# Patient Record
Sex: Female | Born: 1945
Health system: Southern US, Community
[De-identification: ages and names within clinical notes are randomized; demographics above are authoritative.]

## PROBLEM LIST (undated history)

## (undated) DIAGNOSIS — I700 Atherosclerosis of aorta: Secondary | ICD-10-CM

## (undated) DIAGNOSIS — M5430 Sciatica, unspecified side: Secondary | ICD-10-CM

## (undated) DIAGNOSIS — IMO0001 Reserved for inherently not codable concepts without codable children: Secondary | ICD-10-CM

## (undated) DIAGNOSIS — I10 Essential (primary) hypertension: Secondary | ICD-10-CM

## (undated) DIAGNOSIS — E785 Hyperlipidemia, unspecified: Secondary | ICD-10-CM

## (undated) DIAGNOSIS — M48061 Spinal stenosis, lumbar region without neurogenic claudication: Secondary | ICD-10-CM

## (undated) DIAGNOSIS — E559 Vitamin D deficiency, unspecified: Secondary | ICD-10-CM

## (undated) DIAGNOSIS — K219 Gastro-esophageal reflux disease without esophagitis: Secondary | ICD-10-CM

## (undated) DIAGNOSIS — G629 Polyneuropathy, unspecified: Secondary | ICD-10-CM

## (undated) DIAGNOSIS — M8580 Other specified disorders of bone density and structure, unspecified site: Secondary | ICD-10-CM

## (undated) DIAGNOSIS — T7840XA Allergy, unspecified, initial encounter: Secondary | ICD-10-CM

## (undated) DIAGNOSIS — Z87891 Personal history of nicotine dependence: Secondary | ICD-10-CM

## (undated) DIAGNOSIS — N182 Chronic kidney disease, stage 2 (mild): Secondary | ICD-10-CM

## (undated) DIAGNOSIS — I878 Other specified disorders of veins: Secondary | ICD-10-CM

## (undated) DIAGNOSIS — Z972 Presence of dental prosthetic device (complete) (partial): Secondary | ICD-10-CM

## (undated) DIAGNOSIS — E669 Obesity, unspecified: Secondary | ICD-10-CM

## (undated) DIAGNOSIS — M7121 Synovial cyst of popliteal space [Baker], right knee: Secondary | ICD-10-CM

## (undated) HISTORY — DX: Hyperlipidemia, unspecified: E78.5

## (undated) HISTORY — DX: Atherosclerosis of aorta: I70.0

## (undated) HISTORY — PX: ABDOMINAL HYSTERECTOMY: SHX81

## (undated) HISTORY — DX: Gastro-esophageal reflux disease without esophagitis: K21.9

## (undated) HISTORY — DX: Allergy, unspecified, initial encounter: T78.40XA

## (undated) HISTORY — DX: Chronic kidney disease, stage 2 (mild): N18.2

## (undated) HISTORY — DX: Spinal stenosis, lumbar region without neurogenic claudication: M48.061

## (undated) HISTORY — DX: Reserved for inherently not codable concepts without codable children: IMO0001

## (undated) HISTORY — DX: Other specified disorders of veins: I87.8

## (undated) HISTORY — PX: ECTOPIC PREGNANCY SURGERY: SHX613

## (undated) HISTORY — DX: Obesity, unspecified: E66.9

## (undated) HISTORY — DX: Personal history of nicotine dependence: Z87.891

## (undated) HISTORY — DX: Synovial cyst of popliteal space (Baker), right knee: M71.21

## (undated) HISTORY — DX: Essential (primary) hypertension: I10

## (undated) HISTORY — DX: Polyneuropathy, unspecified: G62.9

## (undated) HISTORY — DX: Vitamin D deficiency, unspecified: E55.9

## (undated) HISTORY — PX: CATARACT EXTRACTION, BILATERAL: SHX1313

## (undated) HISTORY — DX: Other specified disorders of bone density and structure, unspecified site: M85.80

## (undated) HISTORY — DX: Sciatica, unspecified side: M54.30

---
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
## 2006-01-03 ENCOUNTER — Ambulatory Visit: Payer: Self-pay | Admitting: Family Medicine

## 2007-02-19 ENCOUNTER — Ambulatory Visit: Payer: Self-pay | Admitting: Family Medicine

## 2007-05-14 ENCOUNTER — Ambulatory Visit: Payer: Self-pay | Admitting: Family Medicine

## 2008-02-25 ENCOUNTER — Ambulatory Visit: Payer: Self-pay | Admitting: Family Medicine

## 2009-02-25 ENCOUNTER — Ambulatory Visit: Payer: Self-pay | Admitting: Family Medicine

## 2009-07-12 ENCOUNTER — Ambulatory Visit: Payer: Self-pay | Admitting: Unknown Physician Specialty

## 2010-03-01 ENCOUNTER — Ambulatory Visit: Payer: Self-pay | Admitting: Family Medicine

## 2011-03-07 ENCOUNTER — Ambulatory Visit: Payer: Self-pay

## 2011-10-31 ENCOUNTER — Ambulatory Visit: Payer: Self-pay | Admitting: Internal Medicine

## 2011-11-07 ENCOUNTER — Ambulatory Visit: Payer: Self-pay | Admitting: Emergency Medicine

## 2012-03-14 ENCOUNTER — Ambulatory Visit: Payer: Self-pay

## 2012-08-25 ENCOUNTER — Ambulatory Visit: Payer: Self-pay | Admitting: Emergency Medicine

## 2012-08-28 DIAGNOSIS — M5430 Sciatica, unspecified side: Secondary | ICD-10-CM | POA: Insufficient documentation

## 2012-08-28 DIAGNOSIS — M5432 Sciatica, left side: Secondary | ICD-10-CM | POA: Insufficient documentation

## 2012-08-28 HISTORY — DX: Sciatica, unspecified side: M54.30

## 2012-09-22 ENCOUNTER — Ambulatory Visit: Payer: Self-pay | Admitting: Internal Medicine

## 2013-03-18 ENCOUNTER — Ambulatory Visit: Payer: Self-pay

## 2013-04-12 ENCOUNTER — Ambulatory Visit: Payer: Self-pay

## 2014-02-02 ENCOUNTER — Ambulatory Visit (INDEPENDENT_AMBULATORY_CARE_PROVIDER_SITE_OTHER): Payer: Medicare PPO

## 2014-02-02 ENCOUNTER — Encounter: Payer: Self-pay | Admitting: Podiatry

## 2014-02-02 ENCOUNTER — Ambulatory Visit (INDEPENDENT_AMBULATORY_CARE_PROVIDER_SITE_OTHER): Payer: Medicare PPO | Admitting: Podiatry

## 2014-02-02 VITALS — BP 140/81 | HR 68 | Resp 16 | Ht 68.0 in | Wt 190.0 lb

## 2014-02-02 DIAGNOSIS — M779 Enthesopathy, unspecified: Secondary | ICD-10-CM

## 2014-02-02 DIAGNOSIS — M2010 Hallux valgus (acquired), unspecified foot: Secondary | ICD-10-CM

## 2014-02-02 DIAGNOSIS — M778 Other enthesopathies, not elsewhere classified: Secondary | ICD-10-CM

## 2014-02-02 DIAGNOSIS — K219 Gastro-esophageal reflux disease without esophagitis: Secondary | ICD-10-CM | POA: Insufficient documentation

## 2014-02-02 DIAGNOSIS — T7840XA Allergy, unspecified, initial encounter: Secondary | ICD-10-CM | POA: Insufficient documentation

## 2014-02-02 DIAGNOSIS — M7751 Other enthesopathy of right foot: Secondary | ICD-10-CM

## 2014-02-02 DIAGNOSIS — G2581 Restless legs syndrome: Secondary | ICD-10-CM

## 2014-02-02 MED ORDER — MELOXICAM 15 MG PO TABS: 15.0000 mg | ORAL_TABLET | Freq: Every day | ORAL | Status: DC

## 2014-02-02 MED ORDER — METHYLPREDNISOLONE (PAK) 4 MG PO TABS: ORAL_TABLET | ORAL | Status: DC

## 2014-02-02 MED ORDER — GABAPENTIN 100 MG PO CAPS: 100.0000 mg | ORAL_CAPSULE | Freq: Every day | ORAL | Status: DC

## 2014-02-02 NOTE — Progress Notes (Signed)
She presents today complaining of painful first and second metatarsophalangeal joint of the right foot states that it has gotten to the point where it began to affect her ability to perform her daily activities. She states this seems to bother her all day and particularly in her work shoes. She is also complaining of pain in her legs at night and states that her legs feel restless and want to move constantly. She denies any trauma to either foot or leg.  Objective: Vital signs are stable she is alert and oriented 3. Pulses are strongly palpable bilaterally. Neurologic sensorium is intact per [employer] monofilament. She has hallux abductovalgus deformity right greater than left with limited range of motion of the first metatarsophalangeal joint she also has pain on end range of motion of the first and second metatarsophalangeal joint of the right foot. No lesions no cellulitis no ecchymosis noted radiographic evaluation does demonstrate osteoarthritis and hallux valgus deformity right foot with elongated second metatarsal right foot.  Assessment: Hallux abductovalgus deformity right foot elongated plantarflexed second metatarsal right foot capsulitis second and first metatarsophalangeal joints of the right foot. Restless leg syndrome bilateral.  Plan: At scuffed etiology pathology conservative versus surgical therapies. I injected the first and second metatarsophalangeal joints today with dexamethasone after sterile Betadine skin prep. I also wrote a prescription for Medrol Dosepak to be followed by meloxicam. I started her on gabapentin for her restless leg syndrome 100 mg by mouth daily at bedtime. I will follow up with her in 1 month.

## 2014-03-02 ENCOUNTER — Ambulatory Visit (INDEPENDENT_AMBULATORY_CARE_PROVIDER_SITE_OTHER): Payer: Medicare PPO | Admitting: Podiatry

## 2014-03-02 VITALS — BP 140/80 | HR 68 | Resp 16

## 2014-03-02 DIAGNOSIS — M779 Enthesopathy, unspecified: Principal | ICD-10-CM

## 2014-03-02 DIAGNOSIS — M2010 Hallux valgus (acquired), unspecified foot: Secondary | ICD-10-CM

## 2014-03-02 DIAGNOSIS — M7751 Other enthesopathy of right foot: Secondary | ICD-10-CM

## 2014-03-02 DIAGNOSIS — M778 Other enthesopathies, not elsewhere classified: Secondary | ICD-10-CM

## 2014-03-02 NOTE — Progress Notes (Signed)
She presents today for follow-up of her capsulitis and hallux valgus deformity of the right foot. She states that this is becoming more more difficulty performing daily activities at work. This rubs my shoe and is causing significant pain to me.  Objective: Vital signs are stable she's alert and oriented 3. Pulses are strongly palpable right. Hallux abductovalgus deformity is present on palpation with range of motion there is pain that she experiences to the medial and dorsomedial aspect of the first metatarsophalangeal joint of the right foot. There is mild overlying erythema and mild edema no cellulitis no drainage or odor and no wound.  Assessment: Hallux abductovalgus deformity of the right foot with chronic capsulitis.  Plan: We discussed the etiology pathology conservative versus surgical therapies. We went over consent form today lumbar line number by number giving her ample time to ask questions she saw fit regarding a [place] bunion repair with screw fixation right foot. I answered all the questions regarding this procedure to the best of my ability in layman's terms understood it was amenable to it and signed all 3 pages of the consent form. We will try to estimate the cost for her. Should she have questions or concerns she will notify us immediately. She will stop by and pick up a Cam Walker prior to surgery.

## 2014-03-16 ENCOUNTER — Telehealth: Payer: Self-pay | Admitting: *Deleted

## 2014-03-16 NOTE — Telephone Encounter (Signed)
LEFT MESSAGE FOR PT TO RETURN CALL.

## 2014-03-16 NOTE — Telephone Encounter (Signed)
Call patient

## 2014-03-18 ENCOUNTER — Telehealth: Payer: Self-pay | Admitting: *Deleted

## 2014-03-18 NOTE — Telephone Encounter (Signed)
Spoke with pt letting her know her surgery will be covered at 100% pt understood and will call back to sch surgery.

## 2014-03-19 ENCOUNTER — Ambulatory Visit: Payer: Self-pay | Admitting: Family Medicine

## 2014-04-22 ENCOUNTER — Ambulatory Visit: Payer: Self-pay | Admitting: Physician Assistant

## 2014-07-13 ENCOUNTER — Ambulatory Visit
Admit: 2014-07-13 | Disposition: A | Discharge status: Home / Self Care | Payer: Self-pay | Attending: Gastroenterology | Admitting: Gastroenterology

## 2014-07-27 ENCOUNTER — Encounter: Payer: Self-pay | Admitting: Podiatry

## 2014-07-27 ENCOUNTER — Ambulatory Visit (INDEPENDENT_AMBULATORY_CARE_PROVIDER_SITE_OTHER): Payer: Medicare PPO | Admitting: Podiatry

## 2014-07-27 VITALS — BP 140/78 | HR 78 | Resp 16 | Ht 68.0 in | Wt 195.0 lb

## 2014-07-27 DIAGNOSIS — M778 Other enthesopathies, not elsewhere classified: Secondary | ICD-10-CM

## 2014-07-27 DIAGNOSIS — M7751 Other enthesopathy of right foot: Secondary | ICD-10-CM | POA: Diagnosis not present

## 2014-07-27 DIAGNOSIS — M779 Enthesopathy, unspecified: Principal | ICD-10-CM

## 2014-07-27 NOTE — Progress Notes (Signed)
She presents today complaining of pain to the first metatarsophalangeal joints bilaterally. He states that she's going have to wait until she returns from [place] before we perform her bunion repair bilaterally.  Objective: No signs are stable she is alert and oriented 3. Pulses are palpable bilateral. She has pain on palpation first metatarsophalangeal joint and in range of motion bilaterally.  Assessment: Hallux valgus with capsulitis bursitis first metatarsophalangeal joint right greater than left.  Plan: Injected intra-articular dexamethasone and local anesthetic today after sterile Betadine skin prep. Follow up with her in 1 month.

## 2014-09-14 ENCOUNTER — Other Ambulatory Visit: Payer: Self-pay

## 2014-09-14 MED ORDER — GABAPENTIN 100 MG PO CAPS: 100.0000 mg | ORAL_CAPSULE | Freq: Every day | ORAL | Status: DC

## 2014-10-06 DIAGNOSIS — E559 Vitamin D deficiency, unspecified: Secondary | ICD-10-CM | POA: Insufficient documentation

## 2014-10-06 DIAGNOSIS — K219 Gastro-esophageal reflux disease without esophagitis: Secondary | ICD-10-CM | POA: Insufficient documentation

## 2014-10-06 DIAGNOSIS — I10 Essential (primary) hypertension: Secondary | ICD-10-CM | POA: Insufficient documentation

## 2014-10-13 ENCOUNTER — Ambulatory Visit: Payer: Self-pay | Admitting: Family Medicine

## 2014-10-14 ENCOUNTER — Ambulatory Visit (INDEPENDENT_AMBULATORY_CARE_PROVIDER_SITE_OTHER): Payer: Commercial Managed Care - HMO | Admitting: Family Medicine

## 2014-10-14 ENCOUNTER — Encounter: Payer: Self-pay | Admitting: Family Medicine

## 2014-10-14 VITALS — BP 124/83 | HR 74 | Temp 97.9°F | Wt 199.0 lb

## 2014-10-14 DIAGNOSIS — Z5181 Encounter for therapeutic drug level monitoring: Secondary | ICD-10-CM | POA: Diagnosis not present

## 2014-10-14 DIAGNOSIS — E785 Hyperlipidemia, unspecified: Secondary | ICD-10-CM | POA: Diagnosis not present

## 2014-10-14 DIAGNOSIS — I10 Essential (primary) hypertension: Secondary | ICD-10-CM

## 2014-10-14 DIAGNOSIS — J029 Acute pharyngitis, unspecified: Secondary | ICD-10-CM

## 2014-10-14 DIAGNOSIS — J309 Allergic rhinitis, unspecified: Secondary | ICD-10-CM | POA: Insufficient documentation

## 2014-10-14 DIAGNOSIS — H1010 Acute atopic conjunctivitis, unspecified eye: Secondary | ICD-10-CM | POA: Insufficient documentation

## 2014-10-14 DIAGNOSIS — K219 Gastro-esophageal reflux disease without esophagitis: Secondary | ICD-10-CM

## 2014-10-14 DIAGNOSIS — J302 Other seasonal allergic rhinitis: Secondary | ICD-10-CM | POA: Diagnosis not present

## 2014-10-14 DIAGNOSIS — H1013 Acute atopic conjunctivitis, bilateral: Secondary | ICD-10-CM

## 2014-10-14 DIAGNOSIS — E559 Vitamin D deficiency, unspecified: Secondary | ICD-10-CM

## 2014-10-14 MED ORDER — MONTELUKAST SODIUM 10 MG PO TABS: 10.0000 mg | ORAL_TABLET | Freq: Every day | ORAL | Status: DC

## 2014-10-14 NOTE — Patient Instructions (Addendum)
Your goal blood pressure is less than 150/90 Try to follow the DASH guidelines (DASH stands for Dietary Approaches to Stop Hypertension) Try to limit the sodium in your diet.  Ideally, consume less than 1.5 grams (less than 1,500mg ) per day. Do not add salt when cooking or at the table.  Check the sodium amount on labels when shopping, and choose items lower in sodium when given a choice. Avoid or limit foods that already contain a lot of sodium. Eat a diet rich in fruits and vegetables and whole grains. Try vitamin C (orange juice if not diabetic or vitamin C tablets) and drink green tea to help your immune system during your illness Get plenty of rest and hydration We'll contact you about the lab results and if you need an antibiotic STOP the furosemide for swelling; keep ankles/feet elevated and avoid salt instead Caution: prolonged use of proton pump inhibitors like omeprazole (Prilosec), pantoprazole (Protonix), esomeprazole (Nexium), and others like Dexilant and Aciphex may increase your risk of pneumonia, Clostridium difficile colitis, osteoporosis, anemia and other health complications Try to limit or avoid triggers like coffee, caffeinated beverages, onions, chocolate, spicy foods, peppermint, acid foods like pizza, spaghetti sauce, and orange juice Lose weight if you are overweight or obese Try elevating the head of your bed by placing a small wedge between your mattress and box springs to keep acid in the stomach at night instead of coming up into your esophagus Give montelukast a try for allergies; if you develop any itching or problems with it, stop immediately and take benadryl and seek medical help

## 2014-10-14 NOTE — Assessment & Plan Note (Signed)
Check level today and supplement if needed 

## 2014-10-14 NOTE — Progress Notes (Signed)
BP 124/83 mmHg  Pulse 74  Temp(Src) 97.9 F (36.6 C)  Wt 199 lb (90.266 kg)  SpO2 96%  LMP  (LMP Unknown)   Subjective:    Patient ID: Dana Peters, female    DOB: 06-Feb-1946, 69 y.o.   MRN: [id number]  HPI: Dana Griffinslizabeth A Steenson is a 69 y.o. female  Chief Complaint  Patient presents with  . Hypertension  . Sore Throat   She was scheduled for a physical today, but was supposed to check on blood pressure she says; not a physical  The throat started to bother her on Sunday (today is Wed); she gets this every two or three years; she gets terrible drainage from her allergies; starts sneezing; wakes up with sore throat; her last doctor would give her 500 mg antibiotic capsules; she has tried lozenges and throat spray and listerine, still having pain; coughing up some phlegm; itching of her ears; no drainage on the outside of the ears, running down the back of the throat; she does not recall ever being told she has strep and that it's allergies; she is using nasal spray; nothing helps until she gets antibiotic pills; the last two doctors would give her that; no fevers; she knows it is not a cold; just does this every 3 or so years  She has hypertension; she is retired now as of July 1st; 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 Interim medical history since our last visit reviewed. Allergies and medications reviewed and  updated.  Review of Systems  Constitutional: Positive for unexpected weight change (little of gain).  Respiratory: Negative for chest tightness and shortness of breath.   Cardiovascular: Negative for chest pain.   Per HPI unless specifically indicated above     Objective:    BP 124/83 mmHg  Pulse 74  Temp(Src) 97.9 F (36.6 C)  Wt 199 lb (90.266 kg)  SpO2 96%  LMP  (LMP Unknown)  Wt Readings from Last 3 Encounters:  10/14/14 199 lb (90.266 kg)  06/30/14 199 lb (90.266 kg)  07/27/14 195 lb (88.451 kg)    Physical Exam  Constitutional: She appears well-developed and well-nourished. No distress.  HENT:  Head: Normocephalic and atraumatic.  Right Ear: No drainage, swelling or tenderness. Tympanic membrane is not injected, not scarred, not perforated, not erythematous and not retracted. No middle ear effusion.  Left Ear: No drainage, swelling or tenderness. Tympanic membrane is not injected, not scarred, not perforated, not erythematous and not retracted.  No middle ear effusion.  Mouth/Throat: Posterior oropharyngeal erythema (mild) present. No oropharyngeal exudate or posterior oropharyngeal edema.  Bilateral cerumen, sticky brown, some removed under direct visualization  Eyes: EOM are normal.  Neck: No thyromegaly present.  Lymphadenopathy:    She has no cervical adenopathy.  Neurological: She displays no tremor.  Skin: She is not diaphoretic.  No results found for this or any previous visit.    Assessment & Plan:   Problem List Items Addressed This Visit      Cardiovascular and Mediastinum   Hypertension - Primary (Chronic)    Continue current regimen; no adjustments; check creatinine, -lytes; dash guidelines encouraged        Respiratory   Allergic rhinitis (Chronic)    Avoid triggers; start singulair        Digestive   Acid reflux (Chronic)    Doing well, using store brand PPI; caution given about long-term use of PPI      Relevant Medications    omeprazole (PRILOSEC OTC) 20 MG tablet     Other   Allergic conjunctivitis (Chronic)    Start singulair; avoid triggers      Vitamin D deficiency disease    Check level today and supplement if needed      Relevant Orders   Vit D  25 hydroxy (rtn osteoporosis monitoring)   Dyslipidemia   Relevant Orders   Lipid Panel w/o Chol/HDL Ratio    Other Visit Diagnoses    Acute pharyngitis, unspecified pharyngitis type        check rapid strep, with culture; I will not prescribe antibiotics if not indicated; symptomatic care    Relevant Medications    montelukast (SINGULAIR) 10 MG tablet    Other Relevant Orders    Strep Gp A Ag, IA W/Reflex    Encounter for medication monitoring        Relevant Orders    CBC with Differential/Platelet    Comprehensive metabolic panel        Follow up plan: Return in about 6 months (around 04/16/2015) for regular f/u; Medicare visit when due also.

## 2014-10-14 NOTE — Assessment & Plan Note (Signed)
Start singulair; avoid triggers 

## 2014-10-14 NOTE — Assessment & Plan Note (Signed)
Avoid triggers; start singulair

## 2014-10-14 NOTE — Assessment & Plan Note (Signed)
Continue current regimen; no adjustments; check creatinine, -lytes; dash guidelines encouraged

## 2014-10-14 NOTE — Assessment & Plan Note (Signed)
Doing well, using store brand PPI; caution given about long-term use of PPI

## 2014-10-15 LAB — COMPREHENSIVE METABOLIC PANEL WITH GFR
ALT: 18 [IU]/L (ref 0–32)
AST: 14 [IU]/L (ref 0–40)
Albumin/Globulin Ratio: 1.7 (ref 1.1–2.5)
Albumin: 4.4 g/dL (ref 3.5–4.8)
Alkaline Phosphatase: 86 [IU]/L (ref 39–117)
BUN/Creatinine Ratio: 15 (ref 11–26)
BUN: 15 mg/dL (ref 8–27)
Bilirubin Total: 0.5 mg/dL (ref 0.0–1.2)
CO2: 27 mmol/L (ref 18–29)
Calcium: 9.9 mg/dL (ref 8.7–10.3)
Chloride: 98 mmol/L (ref 97–108)
Creatinine, Ser: 0.97 mg/dL (ref 0.57–1.00)
GFR calc Af Amer: 68 mL/min/{1.73_m2}
GFR calc non Af Amer: 59 mL/min/{1.73_m2} — ABNORMAL LOW
Globulin, Total: 2.6 g/dL (ref 1.5–4.5)
Glucose: 91 mg/dL (ref 65–99)
Potassium: 4.4 mmol/L (ref 3.5–5.2)
Sodium: 140 mmol/L (ref 134–144)
Total Protein: 7 g/dL (ref 6.0–8.5)

## 2014-10-15 LAB — CBC WITH DIFFERENTIAL/PLATELET
Basophils Absolute: 0 10*3/uL (ref 0.0–0.2)
Basos: 1 %
EOS (ABSOLUTE): 0.3 10*3/uL (ref 0.0–0.4)
Eos: 3 %
Hematocrit: 40.2 % (ref 34.0–46.6)
Hemoglobin: 13.7 g/dL (ref 11.1–15.9)
Immature Grans (Abs): 0 10*3/uL (ref 0.0–0.1)
Immature Granulocytes: 0 %
Lymphocytes Absolute: 2.5 10*3/uL (ref 0.7–3.1)
Lymphs: 29 %
MCH: 32.1 pg (ref 26.6–33.0)
MCHC: 34.1 g/dL (ref 31.5–35.7)
MCV: 94 fL (ref 79–97)
Monocytes Absolute: 0.6 10*3/uL (ref 0.1–0.9)
Monocytes: 7 %
Neutrophils Absolute: 5.1 10*3/uL (ref 1.4–7.0)
Neutrophils: 60 %
Platelets: 211 10*3/uL (ref 150–379)
RBC: 4.27 x10E6/uL (ref 3.77–5.28)
RDW: 13.3 % (ref 12.3–15.4)
WBC: 8.5 10*3/uL (ref 3.4–10.8)

## 2014-10-15 LAB — LIPID PANEL W/O CHOL/HDL RATIO
Cholesterol, Total: 194 mg/dL (ref 100–199)
HDL: 48 mg/dL
LDL Calculated: 110 mg/dL — ABNORMAL HIGH (ref 0–99)
Triglycerides: 180 mg/dL — ABNORMAL HIGH (ref 0–149)
VLDL Cholesterol Cal: 36 mg/dL (ref 5–40)

## 2014-10-15 LAB — VITAMIN D 25 HYDROXY (VIT D DEFICIENCY, FRACTURES): Vit D, 25-Hydroxy: 26.2 ng/mL — ABNORMAL LOW (ref 30.0–100.0)

## 2014-10-16 LAB — STREP GP A AG, IA W/REFLEX: Strep A Culture: NEGATIVE

## 2014-10-17 ENCOUNTER — Encounter: Payer: Self-pay | Admitting: Family Medicine

## 2014-10-17 ENCOUNTER — Other Ambulatory Visit: Payer: Self-pay | Admitting: Family Medicine

## 2014-10-17 DIAGNOSIS — N182 Chronic kidney disease, stage 2 (mild): Secondary | ICD-10-CM

## 2014-10-20 ENCOUNTER — Other Ambulatory Visit: Payer: Self-pay | Admitting: Family Medicine

## 2014-10-20 ENCOUNTER — Telehealth: Payer: Self-pay

## 2014-10-20 ENCOUNTER — Other Ambulatory Visit: Payer: Self-pay

## 2014-10-20 ENCOUNTER — Other Ambulatory Visit: Payer: Commercial Managed Care - HMO

## 2014-10-20 DIAGNOSIS — J029 Acute pharyngitis, unspecified: Secondary | ICD-10-CM | POA: Insufficient documentation

## 2014-10-20 LAB — CBC WITH DIFFERENTIAL/PLATELET
Hematocrit: 38.4 %
Hemoglobin: 13.8 g/dL
Lymphocytes Absolute: 2.4 10*3/uL
Lymphs: 32 %
MCH: 33.9 pg
MCHC: 35.9 g/dL
MCV: 94 fL
MID (Absolute): 0.8 10*3/uL
MID: 10 %
Neutrophils Absolute: 4.3 10*3/uL
Neutrophils: 57 %
Platelets: 201 10*3/uL (ref 150–379)
RBC: 4.07 x10E6/uL
RDW: 13 %
WBC: 7.5 10*3/uL (ref 3.4–10.8)

## 2014-10-20 NOTE — Telephone Encounter (Signed)
Patient notified, she will come by this afternoon for strep and labs.

## 2014-10-20 NOTE — Telephone Encounter (Signed)
Routing to provider  

## 2014-10-20 NOTE — Telephone Encounter (Signed)
Pt called stated her throat is still very sore. Pt stated she would like an antibiotic to clear it up, stated she discussed this with Dr. Sherie DonLada. [facility] [place] Thanks.

## 2014-10-20 NOTE — Telephone Encounter (Signed)
She is welcome to return for a repeat throat swab for strep as well as a monospot and a CBC if she has persistent sore throat Her throat culture was negative; we do not treat sore throats with antibiotics unless it's group A strep; we talked about that I'm sorry if she misunderstood that I was going to call in an antibiotic

## 2014-10-21 LAB — MONONUCLEOSIS SCREEN: Mono Screen: NEGATIVE

## 2014-10-22 LAB — STREP GP A AG, IA W/REFLEX: Strep A Culture: NEGATIVE

## 2014-11-02 ENCOUNTER — Other Ambulatory Visit: Payer: Self-pay | Admitting: Family Medicine

## 2014-12-18 ENCOUNTER — Other Ambulatory Visit: Payer: Commercial Managed Care - HMO

## 2014-12-18 DIAGNOSIS — N182 Chronic kidney disease, stage 2 (mild): Secondary | ICD-10-CM

## 2014-12-19 ENCOUNTER — Encounter: Payer: Self-pay | Admitting: Family Medicine

## 2014-12-19 LAB — BASIC METABOLIC PANEL WITH GFR
BUN/Creatinine Ratio: 17 (ref 11–26)
BUN: 16 mg/dL (ref 8–27)
CO2: 26 mmol/L (ref 18–29)
Calcium: 9.6 mg/dL (ref 8.7–10.3)
Chloride: 99 mmol/L (ref 97–108)
Creatinine, Ser: 0.93 mg/dL (ref 0.57–1.00)
GFR calc Af Amer: 72 mL/min/{1.73_m2}
GFR calc non Af Amer: 62 mL/min/{1.73_m2}
Glucose: 147 mg/dL — ABNORMAL HIGH (ref 65–99)
Potassium: 4.1 mmol/L (ref 3.5–5.2)
Sodium: 142 mmol/L (ref 134–144)

## 2014-12-24 ENCOUNTER — Telehealth: Payer: Self-pay | Admitting: Family Medicine

## 2014-12-24 NOTE — Telephone Encounter (Signed)
Pt has questions about her lab results she received in the mail.  Please call pt.

## 2014-12-24 NOTE — Telephone Encounter (Signed)
I discussed letter, avoid NSAIDs Stay hydrated If any pain, okay to use tylenol per package instructions May try turmeric

## 2015-01-07 ENCOUNTER — Ambulatory Visit (INDEPENDENT_AMBULATORY_CARE_PROVIDER_SITE_OTHER): Payer: Commercial Managed Care - HMO | Admitting: Family Medicine

## 2015-01-07 ENCOUNTER — Encounter: Payer: Self-pay | Admitting: Family Medicine

## 2015-01-07 VITALS — BP 130/77 | HR 82 | Temp 97.6°F | Ht 68.5 in | Wt 201.0 lb

## 2015-01-07 DIAGNOSIS — G5791 Unspecified mononeuropathy of right lower limb: Secondary | ICD-10-CM | POA: Diagnosis not present

## 2015-01-07 DIAGNOSIS — G5792 Unspecified mononeuropathy of left lower limb: Secondary | ICD-10-CM | POA: Diagnosis not present

## 2015-01-07 DIAGNOSIS — M5431 Sciatica, right side: Secondary | ICD-10-CM

## 2015-01-07 DIAGNOSIS — G5793 Unspecified mononeuropathy of bilateral lower limbs: Secondary | ICD-10-CM | POA: Insufficient documentation

## 2015-01-07 DIAGNOSIS — M79605 Pain in left leg: Secondary | ICD-10-CM | POA: Diagnosis not present

## 2015-01-07 DIAGNOSIS — M79604 Pain in right leg: Secondary | ICD-10-CM | POA: Insufficient documentation

## 2015-01-07 DIAGNOSIS — R29898 Other symptoms and signs involving the musculoskeletal system: Secondary | ICD-10-CM | POA: Diagnosis not present

## 2015-01-07 MED ORDER — TRAMADOL HCL 50 MG PO TABS: 50.0000 mg | ORAL_TABLET | Freq: Four times a day (QID) | ORAL | Status: DC | PRN

## 2015-01-07 NOTE — Patient Instructions (Signed)
We'll get studies of your back and legs Use the tramadol if needed for pain Okay to take that with tylenol and/or tumeric If you have not heard anything from my staff in a week about any orders/referrals/studies from today, please contact us here to follow-up [phone]

## 2015-01-07 NOTE — Progress Notes (Signed)
BP 130/77 mmHg  Pulse 82  Temp(Src) 97.6 F (36.4 C)  Ht 5' 8.5" (1.74 m)  Wt 201 lb (91.173 kg)  BMI 30.11 kg/m2  SpO2 97%  LMP  (LMP Unknown)   Subjective:    Patient ID: Dana Peters, female    DOB: 31-Mar-1946, 69 y.o.   MRN: [id number]  HPI: QUETZALLI CLOS is a 69 y.o. female  Chief Complaint  Patient presents with  . Leg Pain    bilateral legs, she was seen for it back in Jan. but had to stop the medicine because it was hurting her kidneys. Also concerned with circulation in legs down to feet.   She has been having pains in her legs; it was stopped for concerns about kidneys (GFR declined) She is taking turmeric and tylenol, but not sure if too strong a dose Tylenol is 650 mg but not taking even every day she tells me The location of pain is thighs and down the legs down to the feet Radiation: once in a while in the right hip Quality: pins and needles; no electric shocks; some aching when laying down Exacerbation: Worse at night; worse when laying down and when she is off of them Alleviating: soaking in hot water to ice water; tylenol helps some Severity: just enough to annoy her Duration: started a couple of years with her sciatic nerve Other symptoms: feet feel number No back pain; no rash; no bladder dysfunction, no bowel dysfunction Some weakness in legs after prolonged standing Wonders about circulation No pain in the back; no cholesterol medicine She was diagnosed with neuropathy by the podiatrist years ago, both feet; he used to give her a shot in the feet No foot pain, just numbness  Relevant past medical, surgical, family and social history reviewed and updated as indicated. Interim medical history since our last visit reviewed. Allergies and medications reviewed and updated.  Review of Systems  Musculoskeletal: Negative for back pain, joint swelling and gait problem.  Skin: Negative for rash.       Mole on right foot present unchanged for years   Neurological: Negative for weakness.   Per HPI unless specifically indicated above     Objective:    BP 130/77 mmHg  Pulse 82  Temp(Src) 97.6 F (36.4 C)  Ht 5' 8.5" (1.74 m)  Wt 201 lb (91.173 kg)  BMI 30.11 kg/m2  SpO2 97%  LMP  (LMP Unknown)  Wt Readings from Last 3 Encounters:  01/07/15 201 lb (91.173 kg)  10/14/14 199 lb (90.266 kg)  06/30/14 199 lb (90.266 kg)    Physical Exam  Constitutional: She appears well-developed and well-nourished. No distress.  HENT:  Head: Normocephalic and atraumatic.  Eyes: EOM are normal. No scleral icterus.  Neck: No thyromegaly present.  Cardiovascular: Normal rate, regular rhythm and normal heart sounds.   No murmur heard. Pulses:      Dorsalis pedis pulses are 2+ on the right side, and 2+ on the left side.       Posterior tibial pulses are 2+ on the right side, and 2+ on the left side.  Pulmonary/Chest: Effort normal and breath sounds normal. No respiratory distress. She has no wheezes.  Abdominal: She exhibits no distension.  Musculoskeletal: Normal range of motion. She exhibits no edema.       Lumbar back: She exhibits normal range of motion, no tenderness and no deformity.  Neurological: She is alert. She displays no atrophy and no tremor. She exhibits normal  muscle tone. Coordination and gait normal.  Reflex Scores:      Patellar reflexes are 1+ on the right side and 1+ on the left side. Skin: Skin is warm and dry. She is not diaphoretic. No pallor.  Psychiatric: She has a normal mood and affect. Her behavior is normal. Judgment and thought content normal.      Assessment & Plan:   Problem List Items Addressed This Visit      Nervous and Auditory   Neuralgia neuritis, sciatic nerve    Medication, therapy      Sciatica    Right side; will get nerve conduction studies      Neuropathy of both feet (HCC)    Will get nerve condution studies and evaluate for possible spinal stenosis      Relevant Orders   Nerve  conduction test   MR Lumbar Spine Wo Contrast     Other   Leg pain, bilateral - Primary    I don't think this is a circulation issue; I believe she may have spinal stenosis, will proceed with testing; medications; referral if no discernable etiology      Relevant Orders   Nerve conduction test   MR Lumbar Spine Wo Contrast   Leg heaviness    Differential diagnoses include spinal stenosis      Relevant Orders   Nerve conduction test   MR Lumbar Spine Wo Contrast      Follow up plan: No Follow-up on file.  An after-visit summary was printed and given to the patient at check-out.  Please see the patient instructions which may contain other information and recommendations beyond what is mentioned above in the assessment and plan. Orders Placed This Encounter  Procedures  . MR Lumbar Spine Wo Contrast  . Nerve conduction test

## 2015-01-07 NOTE — Assessment & Plan Note (Signed)
Right side; will get nerve conduction studies

## 2015-01-10 NOTE — Assessment & Plan Note (Signed)
I don't think this is a circulation issue; I believe she may have spinal stenosis, will proceed with testing; medications; referral if no discernable etiology

## 2015-01-10 NOTE — Assessment & Plan Note (Signed)
Medication, therapy

## 2015-01-10 NOTE — Assessment & Plan Note (Signed)
Differential diagnoses include spinal stenosis

## 2015-01-10 NOTE — Assessment & Plan Note (Signed)
Will get nerve condution studies and evaluate for possible spinal stenosis

## 2015-01-18 ENCOUNTER — Ambulatory Visit: Payer: Commercial Managed Care - HMO

## 2015-01-26 ENCOUNTER — Ambulatory Visit
Admission: RE | Admit: 2015-01-26 | Discharge: 2015-01-26 | Disposition: A | Discharge status: Home / Self Care | Payer: Commercial Managed Care - HMO | Source: Ambulatory Visit | Attending: Family Medicine | Admitting: Family Medicine

## 2015-01-26 DIAGNOSIS — R29898 Other symptoms and signs involving the musculoskeletal system: Secondary | ICD-10-CM

## 2015-01-26 DIAGNOSIS — M4806 Spinal stenosis, lumbar region: Secondary | ICD-10-CM | POA: Insufficient documentation

## 2015-01-26 DIAGNOSIS — M79605 Pain in left leg: Secondary | ICD-10-CM | POA: Diagnosis present

## 2015-01-26 DIAGNOSIS — M1288 Other specific arthropathies, not elsewhere classified, other specified site: Secondary | ICD-10-CM | POA: Insufficient documentation

## 2015-01-26 DIAGNOSIS — G5793 Unspecified mononeuropathy of bilateral lower limbs: Secondary | ICD-10-CM

## 2015-01-26 DIAGNOSIS — M2578 Osteophyte, vertebrae: Secondary | ICD-10-CM | POA: Diagnosis not present

## 2015-01-26 DIAGNOSIS — M5431 Sciatica, right side: Secondary | ICD-10-CM

## 2015-01-26 DIAGNOSIS — M79604 Pain in right leg: Secondary | ICD-10-CM

## 2015-01-28 ENCOUNTER — Telehealth: Payer: Self-pay | Admitting: Family Medicine

## 2015-01-28 DIAGNOSIS — M48061 Spinal stenosis, lumbar region without neurogenic claudication: Secondary | ICD-10-CM | POA: Insufficient documentation

## 2015-01-28 DIAGNOSIS — M47816 Spondylosis without myelopathy or radiculopathy, lumbar region: Secondary | ICD-10-CM | POA: Insufficient documentation

## 2015-01-28 MED ORDER — TRAMADOL HCL 50 MG PO TABS: 50.0000 mg | ORAL_TABLET | Freq: Four times a day (QID) | ORAL | Status: DC | PRN

## 2015-01-28 NOTE — Telephone Encounter (Signed)
I talked with patient about her MRI results She thinks some family hx of back problems Let's refer to orthopaedic spine specialist Let's refer to physical therapy too  IMPRESSION: 1. There is grade 1 anterolisthesis of L5 on S1 secondary to severe bilateral facet arthropathy. There is severe left foraminal stenosis. There is a left lateral disc osteophyte complex. 2. At L4-5 there is a mild broad-based disc bulge with severe right and moderate left facet arthropathy. Mild spinal stenosis. Right lateral recess stenosis. 3. At L3-4 there is a mild broad-based disc bulge and mild bilateral facet arthropathy.

## 2015-01-31 ENCOUNTER — Other Ambulatory Visit: Payer: Self-pay | Admitting: Family Medicine

## 2015-01-31 NOTE — Telephone Encounter (Signed)
BMP Sept 9, 2016 reviewed; Rx approved

## 2015-02-03 ENCOUNTER — Ambulatory Visit: Payer: Commercial Managed Care - HMO | Attending: Family Medicine | Admitting: Physical Therapy

## 2015-02-03 DIAGNOSIS — M25562 Pain in left knee: Secondary | ICD-10-CM | POA: Diagnosis present

## 2015-02-03 DIAGNOSIS — M25561 Pain in right knee: Secondary | ICD-10-CM | POA: Diagnosis present

## 2015-02-03 DIAGNOSIS — M5441 Lumbago with sciatica, right side: Secondary | ICD-10-CM | POA: Diagnosis present

## 2015-02-03 DIAGNOSIS — G8929 Other chronic pain: Secondary | ICD-10-CM | POA: Diagnosis present

## 2015-02-03 DIAGNOSIS — R262 Difficulty in walking, not elsewhere classified: Secondary | ICD-10-CM | POA: Insufficient documentation

## 2015-02-03 DIAGNOSIS — M5442 Lumbago with sciatica, left side: Secondary | ICD-10-CM | POA: Diagnosis present

## 2015-02-04 ENCOUNTER — Encounter: Payer: Self-pay | Admitting: Physical Therapy

## 2015-02-04 NOTE — Addendum Note (Signed)
Addended by: Cammie McgeeSHERK, Nathaly Dawkins C on: 02/04/2015 04:14 PM   Modules accepted: Orders

## 2015-02-04 NOTE — Therapy (Signed)
[facility] [address] [place], [place], [address] Phone: [phone]   Fax:  [phone]  Physical Therapy Evaluation  Patient Details  Name: Dana Griffinslizabeth A Tyndall MRN: [id number] Date of Birth: April 28, 1945 Referring Provider: Dr. Sherie DonLada  Encounter Date: 02/03/2015      PT End of Session - 02/04/15 1503    Visit Number 1   Number of Visits 8   Date for PT Re-Evaluation 03/03/15   Authorization - Visit Number 1   Authorization - Number of Visits 10   PT Start Time 1312   PT Stop Time 1405   PT Time Calculation (min) 53 min   Activity Tolerance Patient tolerated treatment well;No increased pain   Behavior During Therapy [facility] for tasks assessed/performed      Past Medical History  Diagnosis Date  . Reflux   . GERD (gastroesophageal reflux disease)   . Hypertension   . Allergy   . Venous stasis   . Sciatica   . Vitamin D deficiency disease     Past Surgical History  Procedure Laterality Date  . Ectopic pregnancy surgery    . Abdominal hysterectomy      complete, due to heavy    There were no vitals filed for this visit.  Visit Diagnosis:  Difficulty walking  Arthralgia of both lower legs  Chronic midline low back pain with bilateral sciatica      Subjective Assessment - 02/04/15 1458    Subjective Pt reports no pain but occasional numbness and tingling in bilateral legs. Pt reports going to neurosurgeon on 02/08/15. Pt reports her symptoms occur in her legs not in her back when she does have pain. Pt reports having constant numbness and tingling in her feet.   Pertinent History previous episode with resolving with Gabapentin and PT   Limitations Standing;Walking;Lifting   How long can you sit comfortably? no issues with sitting   How long can you stand comfortably? 15 mins   How long can you walk comfortably? 5-10 mins   Diagnostic tests MRI   Patient Stated Goals return to walking routine/[facility]   Currently in Pain? No/denies            [facility] PT Assessment - 02/04/15 0001    Assessment   Medical Diagnosis Foraminal Stenosis of the lumbar region   Referring Provider Dr. Sherie DonLada   Onset Date/Surgical Date 04/10/14   Hand Dominance Right   Next MD Visit 02/08/15   Prior Therapy previous for LBP with sciatic        OBJECTIVE: AROM Lumbar spine in standing: Rotation: WFL Lateral Flexion: WFL, R sided pulling noted with R LF Extension: 37 deg Flexion: 119 deg ODI: 16%, self-perceived minimal disability There ex: TrA contraction and 5 second holds x 5 for 5 seconds. Pt maintained good form and proper hold while breathing. TrA contraction with straight leg raise and pelvic tilts  x 20 (issued as HEP). Attempted marching but increased pt's discomfort in her knees and lower limbs.        PT Education - 02/04/15 1502    Education provided Yes   Education Details Pt given core stability package and told to work on page 1. Marching increases leg pain.   Person(s) Educated Patient   Methods Explanation;Handout;Demonstration;Verbal cues   Comprehension Verbalized understanding;Returned demonstration             PT Long Term Goals - 02/04/15 1548  PT LONG TERM GOAL #1   Title Pt will improve ODI score to  < 8% in order to return to walking program.   Baseline ODI: 16% on 10/26   Time 4   Period Weeks   Status New   PT LONG TERM GOAL #2   Title Pt will return to [employer] classes without reports of increased low back pain.   Time 4   Period Weeks   Status New   PT LONG TERM GOAL #3   Title Pt will be independent with HEP in order to increase lumbar AROM extension without any increase in numbness and tingling symptoms.   Time 4   Period Weeks   Status New               Plan - 02/04/15 1504    Clinical Impression Statement Pt is a 69 y.o. pleasant F referred to PT due to back pain and bilateral numbness  and tingling in her LEs. Pt reports no pain and no numbness and tingling in her LEs besides her feet. Pt's Lumbar AROM for rotation is WFL. Pt lumbar lateral flexion is WFL with a pull on the R side of the body noted with R LF. Pts AROM lumbar extension in standing is 37 deg; flexion in standing is 119 deg. Pt's bilateral LE strength is 5/5 MMT grade. Pts ODI is 16%, self-perceived minimal disability. Pt is (-) for SLR bilaterally. Pt presents with hypomobility of lumbar spine at L1-L5 with tenderness noted at L3-L5 with central PAs. Pt has increased R sided paraspinal tone as compared to L side. Pt has no symptoms with sciatic nerve glides. Pt will benefit from short term skilled PT to increase core stability and increase pain free lumbar mobility to return to prior level of function.   Pt will benefit from skilled therapeutic intervention in order to improve on the following deficits Decreased activity tolerance;Decreased endurance;Hypomobility;Decreased mobility;Improper body mechanics;Postural dysfunction;Pain;Abnormal gait;Difficulty walking   Rehab Potential Good   PT Frequency 2x / week   PT Duration 4 weeks   PT Treatment/Interventions ADLs/Self Care Home Management;Moist Heat;Cryotherapy;Functional mobility training;Therapeutic activities;Therapeutic exercise;Manual techniques;Stair training;Gait training;Patient/family education;Neuromuscular re-education   PT Next Visit Plan progress core stability increase nerve glides/other techniques and strengthening    PT Home Exercise Plan core stability pelvic tilts and bridging   Recommended Other Services return to [employer] and try aquatic therapy   Consulted and Agree with Plan of Care Patient         Problem List Patient Active Problem List   Diagnosis Date Noted  . Foraminal stenosis of lumbar region 01/28/2015  . Facet arthropathy, lumbar 01/28/2015  . Spinal stenosis of lumbar region 01/28/2015  . Neuropathy of both feet  (HCC) 01/07/2015  . Leg pain, bilateral 01/07/2015  . Leg heaviness 01/07/2015  . Pharyngitis 10/20/2014  . CKD (chronic kidney disease) stage 2, GFR 60-89 ml/min 10/17/2014  . Allergic rhinitis 10/14/2014  . Allergic conjunctivitis 10/14/2014  . Dyslipidemia 10/14/2014  . GERD (gastroesophageal reflux disease)   . Hypertension   . Sciatica   . Vitamin D deficiency disease   . Allergic state 02/02/2014  . Acid reflux 02/02/2014  . Neuralgia neuritis, sciatic nerve 08/28/2012    Krista Blue, SPT 02/04/2015, 3:51 PM  [place] [address] [place], [place], [address] Phone: [phone]   Fax:  [phone]  Name: KEON BENSCOTER MRN: [id number] Date of Birth: [birth year]

## 2015-02-08 ENCOUNTER — Encounter: Payer: Commercial Managed Care - HMO | Admitting: Physical Therapy

## 2015-02-11 ENCOUNTER — Ambulatory Visit: Payer: Commercial Managed Care - HMO | Attending: Family Medicine | Admitting: Physical Therapy

## 2015-02-11 ENCOUNTER — Encounter: Payer: Self-pay | Admitting: Physical Therapy

## 2015-02-11 DIAGNOSIS — R262 Difficulty in walking, not elsewhere classified: Secondary | ICD-10-CM | POA: Diagnosis not present

## 2015-02-11 DIAGNOSIS — M5442 Lumbago with sciatica, left side: Secondary | ICD-10-CM | POA: Insufficient documentation

## 2015-02-11 DIAGNOSIS — M5441 Lumbago with sciatica, right side: Secondary | ICD-10-CM | POA: Diagnosis present

## 2015-02-11 DIAGNOSIS — M25561 Pain in right knee: Secondary | ICD-10-CM | POA: Diagnosis present

## 2015-02-11 DIAGNOSIS — G8929 Other chronic pain: Secondary | ICD-10-CM | POA: Diagnosis present

## 2015-02-11 DIAGNOSIS — M25562 Pain in left knee: Secondary | ICD-10-CM | POA: Diagnosis present

## 2015-02-11 NOTE — Therapy (Signed)
[place] [facility] [address] [place], [place], [address] Phone: [phone]   Fax:  [phone]  Physical Therapy Treatment  Patient Details  Name: Dana Peters MRN: [id number] Date of Birth: [birth year] Referring Provider: Dr. Sherie DonLada  Encounter Date: 02/11/2015      PT End of Session - 02/11/15 1706    Visit Number 2   Number of Visits 8   Date for PT Re-Evaluation 03/03/15   Authorization - Visit Number 2   Authorization - Number of Visits 10   PT Start Time 1025   PT Stop Time 1115   PT Time Calculation (min) 50 min   Activity Tolerance Patient tolerated treatment well;No increased pain   Behavior During Therapy [facility] for tasks assessed/performed      Past Medical History  Diagnosis Date  . Reflux   . GERD (gastroesophageal reflux disease)   . Hypertension   . Allergy   . Venous stasis   . Sciatica   . Vitamin D deficiency disease     Past Surgical History  Procedure Laterality Date  . Ectopic pregnancy surgery    . Abdominal hysterectomy      complete, due to heavy    There were no vitals filed for this visit.  Visit Diagnosis:  Difficulty walking  Arthralgia of both lower legs  Chronic midline low back pain with bilateral sciatica      Subjective Assessment - 02/11/15 1706    Subjective Pt reports no back pain and that HEP is going well. Pt states "I am able to do more each day." Pt reports a good visit with the neurosurgeon who agreed to continue PT.    Pertinent History previous episode with resolving with Gabapentin and PT   Limitations Standing;Walking;Lifting   How long can you sit comfortably? no issues with sitting   How long can you stand comfortably? 15 mins   How long can you walk comfortably? 5-10 mins   Diagnostic tests MRI   Patient Stated Goals return to walking routine/[facility]   Currently in Pain? No/denies     OBJECTIVE: There ex: Nustep L7 10  mins (no charge, warmup). Supine knees to chest on white therapy ball x30 (cues required to decrease speed and use hamstrings instead of walking feet up every time). Supine bridging on white therapy ball with ball under her heels x 30. Good form and TrA contraction noted, verbal cues to maintain a hold at the top required. Standing postural corrective exercises: scapular retraction/shoulder extension/horizontal abduction x 20 each with yellow theraband. Consistent verbal cues required for proper form and speed with exercises. Manual: In prone, L1-L5 central PAs grade III 3 x 30 seconds each level. Hypomobility noted throughout (L3-L5 >L1-L2) with improved mobility after PAs. STM to paraspinals.   Pt response to tx for medical necessity: Pt benefits from manual intervention to increase lumbar spine mobility. Pt benefits from core stability and postural corrective exercises to increase awareness and stability for lumbar spine.        PT Long Term Goals - 02/04/15 1548    PT LONG TERM GOAL #1   Title Pt will improve ODI score to  < 8% in order to return to walking program.   Baseline ODI: 16% on 10/26   Time 4   Period Weeks   Status New   PT LONG TERM GOAL #2   Title Pt will return to [employer] land/water classes  without reports of increased low back pain.   Time 4   Period Weeks   Status New   PT LONG TERM GOAL #3   Title Pt will be independent with HEP in order to demonstrate proper lifting/ body mechancs to improve return to yardwork.   Time 4   Period Weeks   Status New               Plan - 02/11/15 1707    Clinical Impression Statement Pt performs exercises quickly and requires consistent verbal cuing to slow down. Pt demonstrates good standing posture and engaging the core with postural corrective exercises. Pt is able to progress TrA contraction exercises to bridging on the ball. Pt still find relief from knees to chest. Pt demonstrate no palpable tenderness in STM.  L1-L5 is hypomobile but responds positively to joint mobilizations.   Pt will benefit from skilled therapeutic intervention in order to improve on the following deficits Decreased activity tolerance;Decreased endurance;Hypomobility;Decreased mobility;Improper body mechanics;Postural dysfunction;Pain;Abnormal gait;Difficulty walking   Rehab Potential Good   PT Frequency 2x / week   PT Duration 4 weeks   PT Treatment/Interventions ADLs/Self Care Home Management;Moist Heat;Cryotherapy;Functional mobility training;Therapeutic activities;Therapeutic exercise;Manual techniques;Stair training;Gait training;Patient/family education;Neuromuscular re-education   PT Next Visit Plan Progress core stabilty/seated ball/standing exercises.   PT Home Exercise Plan continue current program   Consulted and Agree with Plan of Care Patient        Problem List Patient Active Problem List   Diagnosis Date Noted  . Foraminal stenosis of lumbar region 01/28/2015  . Facet arthropathy, lumbar 01/28/2015  . Spinal stenosis of lumbar region 01/28/2015  . Neuropathy of both feet (HCC) 01/07/2015  . Leg pain, bilateral 01/07/2015  . Leg heaviness 01/07/2015  . Pharyngitis 10/20/2014  . CKD (chronic kidney disease) stage 2, GFR 60-89 ml/min 10/17/2014  . Allergic rhinitis 10/14/2014  . Allergic conjunctivitis 10/14/2014  . Dyslipidemia 10/14/2014  . GERD (gastroesophageal reflux disease)   . Hypertension   . Sciatica   . Vitamin D deficiency disease   . Allergic state 02/02/2014  . Acid reflux 02/02/2014  . Neuralgia neuritis, sciatic nerve 08/28/2012   Cammie Mcgee, PT, DPT # [phone]   02/11/2015, 5:12 PM  [facility] [address] [place], [place], [address] Phone: [phone]   Fax:  [phone]  Name: Dana Peters MRN: [id number] Date of Birth: 07-Jul-1945

## 2015-02-15 ENCOUNTER — Encounter: Payer: Commercial Managed Care - HMO | Admitting: Physical Therapy

## 2015-02-18 ENCOUNTER — Encounter: Payer: Self-pay | Admitting: Physical Therapy

## 2015-02-18 ENCOUNTER — Ambulatory Visit: Payer: Commercial Managed Care - HMO | Admitting: Physical Therapy

## 2015-02-18 DIAGNOSIS — G8929 Other chronic pain: Secondary | ICD-10-CM

## 2015-02-18 DIAGNOSIS — M5441 Lumbago with sciatica, right side: Secondary | ICD-10-CM

## 2015-02-18 DIAGNOSIS — R262 Difficulty in walking, not elsewhere classified: Secondary | ICD-10-CM

## 2015-02-18 DIAGNOSIS — M25561 Pain in right knee: Secondary | ICD-10-CM

## 2015-02-18 DIAGNOSIS — M25562 Pain in left knee: Secondary | ICD-10-CM

## 2015-02-18 DIAGNOSIS — M5442 Lumbago with sciatica, left side: Secondary | ICD-10-CM

## 2015-02-18 NOTE — Therapy (Signed)
[place] [facility], [place] [address] [place], [place], [address] Phone: [phone]   Fax:  [phone]  Physical Therapy Treatment  Patient Details  Name: Dana Peters MRN: [id number] Date of Birth: 1945-05-03 Referring Provider: Dr. Sherie Don  Encounter Date: 02/18/2015      PT End of Session - 02/18/15 1627    Visit Number 3   Number of Visits 8   Date for PT Re-Evaluation 03/03/15   Authorization - Visit Number 3   Authorization - Number of Visits 10   PT Start Time 1028   PT Stop Time 1115   PT Time Calculation (min) 47 min   Activity Tolerance Patient tolerated treatment well;No increased pain   Behavior During Therapy [facility] for tasks assessed/performed      Past Medical History  Diagnosis Date  . Reflux   . GERD (gastroesophageal reflux disease)   . Hypertension   . Allergy   . Venous stasis   . Sciatica   . Vitamin D deficiency disease     Past Surgical History  Procedure Laterality Date  . Ectopic pregnancy surgery    . Abdominal hysterectomy      complete, due to heavy    There were no vitals filed for this visit.  Visit Diagnosis:  Difficulty walking  Arthralgia of both lower legs  Chronic midline low back pain with bilateral sciatica      Subjective Assessment - 02/18/15 1626    Subjective Pt reports no new complaints today upon arrival to PT tx session. Pt reports complinace with HEP.    Pertinent History previous episode with resolving with Gabapentin and PT   Limitations Standing;Walking;Lifting   How long can you sit comfortably? no issues with sitting   How long can you stand comfortably? 15 mins   How long can you walk comfortably? 5-10 mins   Diagnostic tests MRI   Patient Stated Goals return to walking routine/working out at [facility] center 3 days a week   Currently in Pain? No/denies     OBJECTIVE: Manual: Supine B hamstring proximal and distal stretching. Supine piriformis stretching. Pt  felt good relief with stretching. Neutral gapping 30 seconds x 4 each side. Good rotation noted and stretching noted. There ex: Supine bridging/marching/bicycle/alternating arm and leg x 30 each. Good form noted but verbal cues required for speed of exercise. Supine trunk rotation stretching x 20, no increased symptoms noted. Cool down: Nustep L7 8 mins (no charge).  Pt response to tx for medical necessity: Pt benefits from core stability to decrease back pain. Pt endurance is limiting but improving with core stability.        PT Long Term Goals - 02/04/15 1548    PT LONG TERM GOAL #1   Title Pt will improve ODI score to  < 8% in order to return to walking program.   Baseline ODI: 16% on 10/26   Time 4   Period Weeks   Status New   PT LONG TERM GOAL #2   Title Pt will return to [employer] classes without reports of increased low back pain.   Time 4   Period Weeks   Status New   PT LONG TERM GOAL #3   Title Pt will be independent with HEP in order to demonstrate proper lifting/ body mechancs to improve return to yardwork.   Time 4   Period Weeks   Status New  Plan - 02/18/15 1628    Clinical Impression Statement Pt requires verbal cues to slow down her exercises and for proper form. Pt is able to maintain TrA contraction with marching/alternating UE/LE and bicycle in supine. Pt requires reminders to maintain TrA contraction with standing posture and upper extremity movement. Pt is limited in endurance but progressing with core stability.    Pt will benefit from skilled therapeutic intervention in order to improve on the following deficits Decreased activity tolerance;Decreased endurance;Hypomobility;Decreased mobility;Improper body mechanics;Postural dysfunction;Pain;Abnormal gait;Difficulty walking   Rehab Potential Good   PT Frequency 2x / week   PT Duration 4 weeks   PT Treatment/Interventions ADLs/Self Care Home Management;Moist  Heat;Cryotherapy;Functional mobility training;Therapeutic activities;Therapeutic exercise;Manual techniques;Stair training;Gait training;Patient/family education;Neuromuscular re-education   PT Next Visit Plan Progress core stabilty/seated ball/standing exercises.   PT Home Exercise Plan continue current program   Consulted and Agree with Plan of Care Patient        Problem List Patient Active Problem List   Diagnosis Date Noted  . Foraminal stenosis of lumbar region 01/28/2015  . Facet arthropathy, lumbar 01/28/2015  . Spinal stenosis of lumbar region 01/28/2015  . Neuropathy of both feet (HCC) 01/07/2015  . Leg pain, bilateral 01/07/2015  . Leg heaviness 01/07/2015  . Pharyngitis 10/20/2014  . CKD (chronic kidney disease) stage 2, GFR 60-89 ml/min 10/17/2014  . Allergic rhinitis 10/14/2014  . Allergic conjunctivitis 10/14/2014  . Dyslipidemia 10/14/2014  . GERD (gastroesophageal reflux disease)   . Hypertension   . Sciatica   . Vitamin D deficiency disease   . Allergic state 02/02/2014  . Acid reflux 02/02/2014  . Neuralgia neuritis, sciatic nerve 08/28/2012    Krista BlueMary Cameron Roniyah Llorens, SPT 02/18/2015, 4:32 PM  [place] [facility] [address] [place], [place], [address] Phone: [phone]   Fax:  [phone]  Name: Dana Peters MRN: [id number] Date of Birth: 01/14/1946

## 2015-02-22 ENCOUNTER — Encounter: Payer: Commercial Managed Care - HMO | Admitting: Physical Therapy

## 2015-02-23 ENCOUNTER — Encounter: Payer: Self-pay | Admitting: Physical Therapy

## 2015-02-23 ENCOUNTER — Ambulatory Visit: Payer: Commercial Managed Care - HMO | Admitting: Physical Therapy

## 2015-02-23 DIAGNOSIS — R262 Difficulty in walking, not elsewhere classified: Secondary | ICD-10-CM

## 2015-02-23 DIAGNOSIS — G8929 Other chronic pain: Secondary | ICD-10-CM

## 2015-02-23 DIAGNOSIS — M5442 Lumbago with sciatica, left side: Secondary | ICD-10-CM

## 2015-02-23 DIAGNOSIS — M25561 Pain in right knee: Secondary | ICD-10-CM

## 2015-02-23 DIAGNOSIS — M5441 Lumbago with sciatica, right side: Secondary | ICD-10-CM

## 2015-02-23 DIAGNOSIS — M25562 Pain in left knee: Secondary | ICD-10-CM

## 2015-02-23 NOTE — Therapy (Signed)
[place] [facility] [address] [place], [place], [address] Phone: [phone]   Fax:  [phone]  Physical Therapy Treatment  Patient Details  Name: Dana Peters MRN: [id number] Date of Birth: 1945/11/21 Referring Provider: Dr. Sherie DonLada  Encounter Date: 02/23/2015      PT End of Session - 02/23/15 1325    Visit Number 4   Number of Visits 8   Date for PT Re-Evaluation 03/03/15   Authorization - Visit Number 4   Authorization - Number of Visits 10   PT Start Time 1022   PT Stop Time 1115   PT Time Calculation (min) 53 min   Activity Tolerance Patient tolerated treatment well;No increased pain   Behavior During Therapy [facility] for tasks assessed/performed      Past Medical History  Diagnosis Date  . Reflux   . GERD (gastroesophageal reflux disease)   . Hypertension   . Allergy   . Venous stasis   . Sciatica   . Vitamin D deficiency disease     Past Surgical History  Procedure Laterality Date  . Ectopic pregnancy surgery    . Abdominal hysterectomy      complete, due to heavy    There were no vitals filed for this visit.  Visit Diagnosis:  Difficulty walking  Arthralgia of both lower legs  Chronic midline low back pain with bilateral sciatica      Subjective Assessment - 02/23/15 1324    Subjective Pt reports some low back stiffness and soreness today. Pt states she feels a pull on the L more than the R. Pt [place] and raking leaves over the weekend without any problems.    Pertinent History previous episode with resolving with Gabapentin and PT   Limitations Standing;Walking;Lifting   How long can you sit comfortably? no issues with sitting   How long can you stand comfortably? 15 mins   How long can you walk comfortably? 5-10 mins   Diagnostic tests MRI   Patient Stated Goals return to walking routine/[facility]   Currently in Pain? No/denies       OBJECTIVE: Manual: STM to lumbar paraspinals L hypertonicity noted. Increased focus on L paraspinals due to increased tone. Central PAs grade III to T12-L5 4 x 30 seconds each level. Hypomobility noted with PAs but no complaints of pain. Bilateral hamstring/piriformis/hip flexor stretching to decrease pull on low back. Ely's test and stretching to loosen anterior tilt on lumbar spine. STM to paraspinals in sitting with Bio[employer]ze. Cool down: 10 mins Nustep L7 (no charge).  Pt response to tx for medical necessity: Pt benefits from manual interventions to promote increased movement and TrA contraction with functional activities.         PT Long Term Goals - 02/04/15 1548    PT LONG TERM GOAL #1   Title Pt will improve ODI score to  < 8% in order to return to walking program.   Baseline ODI: 16% on 10/26   Time 4   Period Weeks   Status New   PT LONG TERM GOAL #2   Title Pt will return to [employer] land/water classes without reports of increased low back pain.   Time 4   Period Weeks   Status New   PT LONG TERM GOAL #3   Title Pt will be independent with HEP in order to demonstrate proper lifting/ body mechancs to improve return to yardwork.  Time 4   Period Weeks   Status New               Plan - 02/23/15 1326    Clinical Impression Statement Asymmetry noted with L hypertonicity in lumbar paraspinals in prone positioning. No palpable trigger points noted. Hypomobility noted with central PAs to T12-L5 with grade III mobilizations. Pt reports tightness with quad stretch in prone, (+) Ely's test.  Pt finds relief in R sidelying with L neutral gapping at L4; good rotational movement noted.   Pt will benefit from skilled therapeutic intervention in order to improve on the following deficits Decreased activity tolerance;Decreased endurance;Hypomobility;Decreased mobility;Improper body mechanics;Postural dysfunction;Pain;Abnormal gait;Difficulty walking   Rehab Potential Good    PT Frequency 2x / week   PT Duration 4 weeks   PT Treatment/Interventions ADLs/Self Care Home Management;Moist Heat;Cryotherapy;Functional mobility training;Therapeutic activities;Therapeutic exercise;Manual techniques;Stair training;Gait training;Patient/family education;Neuromuscular re-education   PT Next Visit Plan ball core stabilty/reassess lumbar paraspinals   PT Home Exercise Plan continue current program   Consulted and Agree with Plan of Care Patient        Problem List Patient Active Problem List   Diagnosis Date Noted  . Foraminal stenosis of lumbar region 01/28/2015  . Facet arthropathy, lumbar 01/28/2015  . Spinal stenosis of lumbar region 01/28/2015  . Neuropathy of both feet (HCC) 01/07/2015  . Leg pain, bilateral 01/07/2015  . Leg heaviness 01/07/2015  . Pharyngitis 10/20/2014  . CKD (chronic kidney disease) stage 2, GFR 60-89 ml/min 10/17/2014  . Allergic rhinitis 10/14/2014  . Allergic conjunctivitis 10/14/2014  . Dyslipidemia 10/14/2014  . GERD (gastroesophageal reflux disease)   . Hypertension   . Sciatica   . Vitamin D deficiency disease   . Allergic state 02/02/2014  . Acid reflux 02/02/2014  . Neuralgia neuritis, sciatic nerve 08/28/2012    Krista Blue, SPT 02/23/2015, 1:35 PM  [facility] [address] [place], [place], [address] Phone: [phone]   Fax:  [phone]  Name: Dana Peters MRN: [id number] Date of Birth: 1945/10/08

## 2015-02-25 ENCOUNTER — Ambulatory Visit: Payer: Commercial Managed Care - HMO | Admitting: Physical Therapy

## 2015-02-25 ENCOUNTER — Encounter: Payer: Self-pay | Admitting: Physical Therapy

## 2015-02-25 DIAGNOSIS — M5442 Lumbago with sciatica, left side: Secondary | ICD-10-CM

## 2015-02-25 DIAGNOSIS — M25561 Pain in right knee: Secondary | ICD-10-CM

## 2015-02-25 DIAGNOSIS — M25562 Pain in left knee: Secondary | ICD-10-CM

## 2015-02-25 DIAGNOSIS — G8929 Other chronic pain: Secondary | ICD-10-CM

## 2015-02-25 DIAGNOSIS — R262 Difficulty in walking, not elsewhere classified: Secondary | ICD-10-CM

## 2015-02-25 DIAGNOSIS — M5441 Lumbago with sciatica, right side: Secondary | ICD-10-CM

## 2015-02-25 NOTE — Therapy (Signed)
[place] [facility] [address] [place], [place], [address] Phone: [phone]   Fax:  [phone]  Physical Therapy Treatment  Patient Details  Name: Dana Peters MRN: [id number] Date of Birth: 05-04-1945 Referring Provider: Dr. Sherie Don  Encounter Date: 02/25/2015      PT End of Session - 02/25/15 2131    Visit Number 5   Number of Visits 8   Date for PT Re-Evaluation 03/03/15   Authorization - Visit Number 5   Authorization - Number of Visits 10   PT Start Time 1028   PT Stop Time 1115   PT Time Calculation (min) 47 min   Activity Tolerance Patient tolerated treatment well;No increased pain   Behavior During Therapy [facility] for tasks assessed/performed      Past Medical History  Diagnosis Date  . Reflux   . GERD (gastroesophageal reflux disease)   . Hypertension   . Allergy   . Venous stasis   . Sciatica   . Vitamin D deficiency disease     Past Surgical History  Procedure Laterality Date  . Ectopic pregnancy surgery    . Abdominal hysterectomy      complete, due to heavy    There were no vitals filed for this visit.  Visit Diagnosis:  Difficulty walking  Arthralgia of both lower legs  Chronic midline low back pain with bilateral sciatica      Subjective Assessment - 02/25/15 2130    Subjective Pt reports stiffness in her hamstrings today. Pt states she is going to try the chiropractor for her neuropathy after the first of the year.   Pertinent History previous episode with resolving with Gabapentin and PT   Limitations Standing;Walking;Lifting   How long can you sit comfortably? no issues with sitting   How long can you stand comfortably? 15 mins   How long can you walk comfortably? 5-10 mins   Diagnostic tests MRI   Patient Stated Goals return to walking routine/working out at [facility] center 3 days a week   Currently in Pain? No/denies       OBJECTIVE: ODI: 22%, self-perceived moderate  disability There ex: Nustep L7 5 mins (warm up, no charge). Resisted gait with 2 BTB all 4 planes x 10. Pt had difficulty controlling side stepping and resistance back in all planes. Walking on heels and toes x 6 in // bars with no UE support. Pt able to clear toes higher than heel but symmetrical on both sides. Mini squats on BOSU x 30 with verbal cues for proper form. No carryover in form without verbal cues. Alternating high knees while walking x 6 in the // bars with no UE support. Forwards and Backwards walking to encourage increased step length with ambulation. On blue therapy ball: pelvic tilts/marching/bicycle/alternating UE and LE 20 x 2 each. Pt was unable to maintain TrA contraction and balance without UE support and min A from PT.   Pt response to tx for medical necessity: Pt benefits from core stability to return to active lifestyle with decreased back pain. Pt benefits from stability for lumbar spine with all distal movement.          PT Long Term Goals - 02/04/15 1548    PT LONG TERM GOAL #1   Title Pt will improve ODI score to  < 8% in order to return to walking program.   Baseline ODI: 16% on 10/26   Time 4   Period Weeks   Status New  PT LONG TERM GOAL #2   Title Pt will return to [employer] land/water classes without reports of increased low back pain.   Time 4   Period Weeks   Status New   PT LONG TERM GOAL #3   Title Pt will be independent with HEP in order to demonstrate proper lifting/ body mechancs to improve return to yardwork.   Time 4   Period Weeks   Status New               Plan - 02/25/15 2131    Clinical Impression Statement Pt demonstrates good core control with standing activities. Pt is symptoms free with all there ex challenging LEs and increasing standing tolerance. Pt has difficulty maintaining balance on therapy ball. Pt demonstrates poor posture and TrA control on  the therapy ball. Pt cannot correct khyphotic posture on the ball with  verbal cues or visual feedback. ODI: 22% self perceived moderate disability.   Pt will benefit from skilled therapeutic intervention in order to improve on the following deficits Decreased activity tolerance;Decreased endurance;Hypomobility;Decreased mobility;Improper body mechanics;Postural dysfunction;Pain;Abnormal gait;Difficulty walking   Rehab Potential Good   PT Frequency 2x / week   PT Duration 4 weeks   PT Treatment/Interventions ADLs/Self Care Home Management;Moist Heat;Cryotherapy;Functional mobility training;Therapeutic activities;Therapeutic exercise;Manual techniques;Stair training;Gait training;Patient/family education;Neuromuscular re-education   PT Next Visit Plan ball core stabilty/reassess lumbar paraspinals   PT Home Exercise Plan continue current program   Recommended Other Services encouraged return to [employer]   Consulted and Agree with Plan of Care Patient        Problem List Patient Active Problem List   Diagnosis Date Noted  . Foraminal stenosis of lumbar region 01/28/2015  . Facet arthropathy, lumbar 01/28/2015  . Spinal stenosis of lumbar region 01/28/2015  . Neuropathy of both feet (HCC) 01/07/2015  . Leg pain, bilateral 01/07/2015  . Leg heaviness 01/07/2015  . Pharyngitis 10/20/2014  . CKD (chronic kidney disease) stage 2, GFR 60-89 ml/min 10/17/2014  . Allergic rhinitis 10/14/2014  . Allergic conjunctivitis 10/14/2014  . Dyslipidemia 10/14/2014  . GERD (gastroesophageal reflux disease)   . Hypertension   . Sciatica   . Vitamin D deficiency disease   . Allergic state 02/02/2014  . Acid reflux 02/02/2014  . Neuralgia neuritis, sciatic nerve 08/28/2012    Krista BlueMary Cameron Arlando Leisinger, SPT 02/25/2015, 9:35 PM  [place] [facility] [address] [place], [place], [address] Phone: [phone]   Fax:  [phone]  Name: Dana Peters MRN: [id number] Date of Birth: 1945-04-26

## 2015-03-01 ENCOUNTER — Ambulatory Visit: Payer: Commercial Managed Care - HMO | Admitting: Physical Therapy

## 2015-03-01 ENCOUNTER — Encounter: Payer: Self-pay | Admitting: Physical Therapy

## 2015-03-01 DIAGNOSIS — R262 Difficulty in walking, not elsewhere classified: Secondary | ICD-10-CM

## 2015-03-01 DIAGNOSIS — M25561 Pain in right knee: Secondary | ICD-10-CM

## 2015-03-01 DIAGNOSIS — M5442 Lumbago with sciatica, left side: Secondary | ICD-10-CM

## 2015-03-01 DIAGNOSIS — M5441 Lumbago with sciatica, right side: Secondary | ICD-10-CM

## 2015-03-01 DIAGNOSIS — M25562 Pain in left knee: Secondary | ICD-10-CM

## 2015-03-01 DIAGNOSIS — G8929 Other chronic pain: Secondary | ICD-10-CM

## 2015-03-01 NOTE — Therapy (Signed)
[place] [facility] [address] [place], [place], [address] Phone: [phone]   Fax:  [phone]  Physical Therapy Treatment  Patient Details  Name: Dana Peters MRN: [id number] Date of Birth: [birth year] Referring Provider: Dr. Sherie Don  Encounter Date: 03/01/2015      PT End of Session - 03/01/15 1532    Visit Number 6   Number of Visits 8   Date for PT Re-Evaluation 03/03/15   Authorization - Visit Number 6   Authorization - Number of Visits 10   PT Start Time 1028   PT Stop Time 1115   PT Time Calculation (min) 47 min   Activity Tolerance Patient tolerated treatment well;No increased pain   Behavior During Therapy [facility] for tasks assessed/performed      Past Medical History  Diagnosis Date  . Reflux   . GERD (gastroesophageal reflux disease)   . Hypertension   . Allergy   . Venous stasis   . Sciatica   . Vitamin D deficiency disease     Past Surgical History  Procedure Laterality Date  . Ectopic pregnancy surgery    . Abdominal hysterectomy      complete, due to heavy    There were no vitals filed for this visit.  Visit Diagnosis:  Difficulty walking  Arthralgia of both lower legs  Chronic midline low back pain with bilateral sciatica      Subjective Assessment - 03/01/15 1531    Subjective Pt has no new complaints. Pt states she felt good after last tx session and loose like something "popped."   Pertinent History previous episode with resolving with Gabapentin and PT   Limitations Standing;Walking;Lifting   How long can you sit comfortably? no issues with sitting   How long can you stand comfortably? 15 mins   How long can you walk comfortably? 5-10 mins   Diagnostic tests MRI   Patient Stated Goals return to walking routine/working out at [facility] center 3 days a week   Currently in Pain? No/denies       OBJECTIVE: There ex: In supine with TrA contraction, bridging/bridging with marching/bicycle x  30 each. Pt demonstrates proper form but requires verbal cues to perform exercises at a proper pace. On the blue therapy ball, pelvic tilts/marching with tactile and verbal cues and minimum assistance to stabilize the ball. Pt is able to perform exercises but demonstrates poor TrA control and tends to lean towards the side she is lifting up while marching. Attempted to perform alternating arms and legs, but pt unable to maintain balance on the ball to perform. Pt had difficulty lifting opposites and chose to lift same side. Resisted gait all 4 planes x 10 each plane. Pt demonstrates decreased cadence and increased shuffling with resisted gait; pt unable to correct with verbal cues.   Pt response to tx for medical necessity: Pt benefits from core stability to decrease back pain with distal mobility. Pt transitioning towards independence and discussed possible discharge next session.           PT Long Term Goals - 02/04/15 1548    PT LONG TERM GOAL #1   Title Pt will improve ODI score to  < 8% in order to return to walking program.   Baseline ODI: 16% on 10/26   Time 4   Period Weeks   Status New   PT LONG TERM GOAL #2   Title Pt will return to [employer] classes without reports of increased  low back pain.   Time 4   Period Weeks   Status New   PT LONG TERM GOAL #3   Title Pt will be independent with HEP in order to demonstrate proper lifting/ body mechancs to improve return to yardwork.   Time 4   Period Weeks   Status New               Plan - 03/01/15 1536    Clinical Impression Statement Pt requires consistent verbal and tactile cues for TrA contraction on stability ball. Pt is able to perform marching with minimal assistance to stabilize the ball and verbal cues to fix trunk lean. Pt has difficulty sequencing alternating arms and legs without losing balance on the ball. Pt demonstrates proper TrA contraction with supine exercises and remembers to maintain core  with standing LE strengthening.   Pt will benefit from skilled therapeutic intervention in order to improve on the following deficits Decreased activity tolerance;Decreased endurance;Hypomobility;Decreased mobility;Improper body mechanics;Postural dysfunction;Pain;Abnormal gait;Difficulty walking;Dizziness;Decreased strength   Rehab Potential Good   PT Frequency 2x / week   PT Duration 4 weeks   PT Treatment/Interventions ADLs/Self Care Home Management;Moist Heat;Cryotherapy;Functional mobility training;Therapeutic activities;Therapeutic exercise;Manual techniques;Stair training;Gait training;Patient/family education;Neuromuscular re-education   PT Next Visit Plan ball handout/discharge   PT Home Exercise Plan continue current program   Consulted and Agree with Plan of Care Patient        Problem List Patient Active Problem List   Diagnosis Date Noted  . Foraminal stenosis of lumbar region 01/28/2015  . Facet arthropathy, lumbar 01/28/2015  . Spinal stenosis of lumbar region 01/28/2015  . Neuropathy of both feet (HCC) 01/07/2015  . Leg pain, bilateral 01/07/2015  . Leg heaviness 01/07/2015  . Pharyngitis 10/20/2014  . CKD (chronic kidney disease) stage 2, GFR 60-89 ml/min 10/17/2014  . Allergic rhinitis 10/14/2014  . Allergic conjunctivitis 10/14/2014  . Dyslipidemia 10/14/2014  . GERD (gastroesophageal reflux disease)   . Hypertension   . Sciatica   . Vitamin D deficiency disease   . Allergic state 02/02/2014  . Acid reflux 02/02/2014  . Neuralgia neuritis, sciatic nerve 08/28/2012   Dana Peters, PT, DPT # [phone] [id number]   03/01/2015, 4:46 PM  [place] [facility] [address] [place], [place], [address] Phone: [phone]   Fax:  [phone]  Name: Dana Peters MRN: [id number] Date of Birth: 1945/11/22

## 2015-03-08 ENCOUNTER — Encounter: Payer: Self-pay | Admitting: Physical Therapy

## 2015-03-08 ENCOUNTER — Ambulatory Visit: Payer: Commercial Managed Care - HMO | Admitting: Physical Therapy

## 2015-03-08 DIAGNOSIS — M25562 Pain in left knee: Secondary | ICD-10-CM

## 2015-03-08 DIAGNOSIS — G8929 Other chronic pain: Secondary | ICD-10-CM

## 2015-03-08 DIAGNOSIS — R262 Difficulty in walking, not elsewhere classified: Secondary | ICD-10-CM

## 2015-03-08 DIAGNOSIS — M5442 Lumbago with sciatica, left side: Secondary | ICD-10-CM

## 2015-03-08 DIAGNOSIS — M25561 Pain in right knee: Secondary | ICD-10-CM

## 2015-03-08 DIAGNOSIS — M5441 Lumbago with sciatica, right side: Secondary | ICD-10-CM

## 2015-03-08 NOTE — Therapy (Signed)
[place] [facility] [address] [place], [place], [address] Phone: [phone]   Fax:  [phone]  Physical Therapy Treatment  Patient Details  Name: Dana Peters MRN: [id number] Date of Birth: 1946-02-26 Referring Provider: Dr. Sanda Klein  Encounter Date: 03/08/2015      PT End of Session - 03/08/15 1236    Visit Number 7   Number of Visits 8   Date for PT Re-Evaluation 03/10/15   Authorization - Visit Number 7   Authorization - Number of Visits 10   PT Start Time 8676   PT Stop Time 7209   PT Time Calculation (min) 42 min   Activity Tolerance Patient tolerated treatment well;No increased pain   Behavior During Therapy [facility] for tasks assessed/performed      Past Medical History  Diagnosis Date  . Reflux   . GERD (gastroesophageal reflux disease)   . Hypertension   . Allergy   . Venous stasis   . Sciatica   . Vitamin D deficiency disease     Past Surgical History  Procedure Laterality Date  . Ectopic pregnancy surgery    . Abdominal hysterectomy      complete, due to heavy    There were no vitals filed for this visit.  Visit Diagnosis:  Difficulty walking  Arthralgia of both lower legs  Chronic midline low back pain with bilateral sciatica      Subjective Assessment - 03/08/15 1234    Subjective Pt reports no complaints upon arrival to PT. Pt states [place] the beach went well and has no reports of symptoms.   Pertinent History previous episode with resolving with Gabapentin and PT   Limitations Standing;Walking;Lifting   How long can you sit comfortably? no issues with sitting   How long can you stand comfortably? 15 mins   How long can you walk comfortably? 5-10 mins   Diagnostic tests MRI   Patient Stated Goals return to walking routine/working out at [facility] center 3 days a week   Currently in Pain? No/denies      OBJECTIVE: There ex: Nustep L7 8 mins warm up (no charge). Squats to  demonstrate proper form x 30 in // bars with no UE support; proper form demonstrated without cuing. Seated on blue therapy ball: marching/alternating arm and leg/straight leg raises 20 x 2 each. Pt had difficulty with sequencing alternating. Pt required constant verbal cues to maintain proper posture, speed and good TrA contraction. Seated scapular retraction and shoulder extension on the blue therapy ball with green theraband x 30. Supine: bridging/knees to chest on white therapy ball x30 with good technique. Verbal cues required for proper speed with motion. Supine bridging with marching/alternating leg lift 20 x 2 each. Verbal cues for sequencing bridge first then leg movement. Quadruped alternating leg/alternating arm and leg x 20 each with good form and speed noted. Reviewed HEP and issued new handouts.  Pt response to tx for medical necessity: Pt demonstrates proper core control and TrA contraction with functional mobility. Pt is discharged to independent HEP due to proficiency noted in the clinic.        PT Education - 03/08/15 1235    Education provided Yes   Education Details Pt given quadruped, supine ball bridging and advanced bridging activities.    Person(s) Educated Patient   Methods Explanation;Demonstration;Verbal cues;Handout   Comprehension Verbalized understanding;Returned demonstration;Verbal cues required             PT Long Term  Goals - 2015-03-30 1249    PT LONG TERM GOAL #1   Title Pt will improve ODI score to  < 8% in order to return to walking program.   Baseline ODI: 16% on 10/26; 4% on 03-30-23   Time 4   Period Weeks   Status Achieved   PT LONG TERM GOAL #2   Title Pt will return to [employer] land/water classes without reports of increased low back pain.   Baseline Pt has a plan to transition to [employer] instead of PT   Time 4   Period Weeks   Status Not Met   PT LONG TERM GOAL #3   Title Pt will be independent with HEP in order to demonstrate  proper lifting/ body mechancs to improve return to yardwork.   Time 4   Period Weeks   Status Achieved               Plan - 2015-03-30 1237    Clinical Impression Statement Pt has progressed well with PT. Pt is now independent with supine TrA contraction and able to maintain a TrA contraction while performing dynamic LE movements. For seated TrA stability on the blue therapy ball, pt still requires min A and tactile cues to maintain correct posture. Pt was educated on importance of maintaining activity level  and keeping her core braced with all activities. Pt verbalized understanding and is now discharged to independent HEP. ODI: 4% self-reported minimal disability.   Pt will benefit from skilled therapeutic intervention in order to improve on the following deficits Decreased activity tolerance;Decreased endurance;Hypomobility;Decreased mobility;Improper body mechanics;Postural dysfunction;Pain;Abnormal gait;Difficulty walking;Dizziness;Decreased strength   Rehab Potential Good   PT Frequency 2x / week   PT Duration 4 weeks   PT Treatment/Interventions ADLs/Self Care Home Management;Moist Heat;Cryotherapy;Functional mobility training;Therapeutic activities;Therapeutic exercise;Manual techniques;Stair training;Gait training;Patient/family education;Neuromuscular re-education   PT Home Exercise Plan continue current program   Recommended Other Services return to [place] and Agree with Plan of Care Patient          G-Codes - 2015-03-30 1422    Functional Assessment Tool Used clinical impression/ pain/ muscle weakness   Functional Limitation Mobility: Walking and moving around   Mobility: Walking and Moving Around Current Status [phone]) 0 percent impaired, limited or restricted   Mobility: Walking and Moving Around Goal Status [phone]) 0 percent impaired, limited or restricted   Mobility: Walking and Moving Around Discharge Status [phone]) 0 percent impaired, limited or  restricted      Problem List Patient Active Problem List   Diagnosis Date Noted  . Foraminal stenosis of lumbar region 01/28/2015  . Facet arthropathy, lumbar 01/28/2015  . Spinal stenosis of lumbar region 01/28/2015  . Neuropathy of both feet ([place]) 01/07/2015  . Leg pain, bilateral 01/07/2015  . Leg heaviness 01/07/2015  . Pharyngitis 10/20/2014  . CKD (chronic kidney disease) stage 2, GFR 60-89 ml/min 10/17/2014  . Allergic rhinitis 10/14/2014  . Allergic conjunctivitis 10/14/2014  . Dyslipidemia 10/14/2014  . GERD (gastroesophageal reflux disease)   . Hypertension   . Sciatica   . Vitamin D deficiency disease   . Allergic state 02/02/2014  . Acid reflux 02/02/2014  . Neuralgia neuritis, sciatic nerve 08/28/2012   Pura Spice, PT, DPT # [phone]   03/09/2015, 2:23 PM  [place] [facility] [address] [place], [place], [address] Phone: [phone]   Fax:  [phone]  Name: Dana Peters MRN: [id number] Date of Birth:  07-Jun-1945

## 2015-03-09 NOTE — Addendum Note (Signed)
Addended by: Cammie McgeeSHERK, MICHAEL C on: 03/09/2015 02:26 PM   Modules accepted: Orders

## 2015-03-12 ENCOUNTER — Telehealth: Payer: Self-pay | Admitting: Family Medicine

## 2015-03-12 DIAGNOSIS — Z1239 Encounter for other screening for malignant neoplasm of breast: Secondary | ICD-10-CM | POA: Insufficient documentation

## 2015-03-12 NOTE — Telephone Encounter (Signed)
Pt came in asks that Dr. lada call her she would like to talk to Dr. Sherie DonLada about what has been going on since the MRI also would like a mammogram ordered if possible. Please call pt ASAP. Thanks.

## 2015-03-12 NOTE — Telephone Encounter (Signed)
Routing to provider  

## 2015-03-12 NOTE — Telephone Encounter (Signed)
I spoke with patient Dana Peters had the MRI; [place] and he did not want to do surgery; Dana Peters has continued therapy Her feet kept bothering; Dana Peters is doing acupunture for that and it is helping; only done two sessions Dana Peters just wanted to let me know what's going on with her Dana Peters used to get her breasts checked every year, needs doctor's order

## 2015-05-02 ENCOUNTER — Other Ambulatory Visit: Payer: Self-pay | Admitting: Family Medicine

## 2015-05-02 NOTE — Telephone Encounter (Signed)
Last BP, last creatinine and K+ reviewed; Rx approved

## 2015-05-07 ENCOUNTER — Other Ambulatory Visit: Payer: Self-pay | Admitting: Family Medicine

## 2015-05-07 ENCOUNTER — Encounter: Payer: Self-pay | Admitting: Family Medicine

## 2015-05-07 ENCOUNTER — Ambulatory Visit (INDEPENDENT_AMBULATORY_CARE_PROVIDER_SITE_OTHER): Payer: Commercial Managed Care - HMO | Admitting: Family Medicine

## 2015-05-07 VITALS — BP 137/82 | HR 64 | Temp 97.5°F | Wt 199.0 lb

## 2015-05-07 DIAGNOSIS — M47816 Spondylosis without myelopathy or radiculopathy, lumbar region: Secondary | ICD-10-CM | POA: Diagnosis not present

## 2015-05-07 DIAGNOSIS — B079 Viral wart, unspecified: Secondary | ICD-10-CM | POA: Diagnosis not present

## 2015-05-07 DIAGNOSIS — J302 Other seasonal allergic rhinitis: Secondary | ICD-10-CM | POA: Diagnosis not present

## 2015-05-07 DIAGNOSIS — B078 Other viral warts: Secondary | ICD-10-CM

## 2015-05-07 DIAGNOSIS — E785 Hyperlipidemia, unspecified: Secondary | ICD-10-CM | POA: Diagnosis not present

## 2015-05-07 DIAGNOSIS — M4806 Spinal stenosis, lumbar region: Secondary | ICD-10-CM

## 2015-05-07 DIAGNOSIS — Z1159 Encounter for screening for other viral diseases: Secondary | ICD-10-CM | POA: Diagnosis not present

## 2015-05-07 DIAGNOSIS — K219 Gastro-esophageal reflux disease without esophagitis: Secondary | ICD-10-CM | POA: Diagnosis not present

## 2015-05-07 DIAGNOSIS — G5793 Unspecified mononeuropathy of bilateral lower limbs: Secondary | ICD-10-CM

## 2015-05-07 DIAGNOSIS — I10 Essential (primary) hypertension: Secondary | ICD-10-CM

## 2015-05-07 DIAGNOSIS — Z5181 Encounter for therapeutic drug level monitoring: Secondary | ICD-10-CM | POA: Insufficient documentation

## 2015-05-07 DIAGNOSIS — N182 Chronic kidney disease, stage 2 (mild): Secondary | ICD-10-CM | POA: Diagnosis not present

## 2015-05-07 DIAGNOSIS — M48061 Spinal stenosis, lumbar region without neurogenic claudication: Secondary | ICD-10-CM

## 2015-05-07 DIAGNOSIS — E559 Vitamin D deficiency, unspecified: Secondary | ICD-10-CM

## 2015-05-07 MED ORDER — GABAPENTIN 100 MG PO CAPS: ORAL_CAPSULE | ORAL | Status: DC

## 2015-05-07 NOTE — Assessment & Plan Note (Signed)
Check vit D 

## 2015-05-07 NOTE — Assessment & Plan Note (Signed)
Taking singulair during allergy seasons

## 2015-05-07 NOTE — Assessment & Plan Note (Signed)
Using H2 blocker PRN; avoid triggers

## 2015-05-07 NOTE — Assessment & Plan Note (Addendum)
Check creatinine and K+ and urine microalbumin; avoid NSAIDs

## 2015-05-07 NOTE — Telephone Encounter (Signed)
A 90 day supply is not appropriate right now; we're tapering up; she may end up on 300 or 600 mg at a time; we'll start with this limited supply at first; when dose steady, we'll do 90 days

## 2015-05-07 NOTE — Patient Instructions (Addendum)
Try Duofilm on the place on your right thumb and it should dissolve that over the next several weeks Start gabapentin for neuropathy Take one at bedtime Friday, Saturday, and Sunday night Then one twice a day on Monday, Tuesday, and Wednesday Then one three times a day Call me with an update in about 3 weeks and we'll decide if we should leave it there or increase or taper you back down Do not stop this cold [place] You'll hear from [name] about your labs If you need something for aches or pains, try to use Tylenol (acetaminphen) instead of non-steroidals (which include Aleve, ibuprofen, Advil, Motrin, and naproxen); non-steroidals can cause long-term kidney damage

## 2015-05-07 NOTE — Progress Notes (Signed)
BP 137/82 mmHg  Pulse 64  Temp(Src) 97.5 F (36.4 C)  Wt 199 lb (90.266 kg)  SpO2 98%  LMP  (LMP Unknown)   Subjective:    Patient ID: Dana Peters, female    DOB: 1945/09/23, 70 y.o.   MRN: [id number]  HPI: Dana Peters is a 70 y.o. female  Chief Complaint  Patient presents with  . Labs    she'd like her sugar checked to see if she has Diabetes and Hep C tested  . Hypertension    follow up   She has neuropathy; she has been seeing acupuncturist; some help, not curing it; wants labs checked for diabetes or something like it; no dry mouth; does have blurry vision, saw eye doctor last week; she has have cataract in the making; she has spinal stenosis and saw a back surgeon in [place]; she thinks she also has scoliosis; no surgery indicated at this time she says; she did physical therapy for 6-8 weeks and still doing exercises at home; she has a bicycle for circulation; she can walk more but neuropathy limits her  Energy level is good; no blood in the stool  Hypertension; controlled today; does not check at home; checked it at the eye doctor last Friday; 127/83 or 84; she tries to stay away from salt; does eat some processed pork, tries to limit salt  She would like hepatitis C testing; she saw a commercial about it on TV  Relevant past medical, surgical, family and social history reviewed and updated as indicated.  Past Medical History  Diagnosis Date  . Reflux   . GERD (gastroesophageal reflux disease)   . Hypertension   . Allergy   . Venous stasis   . Sciatica   . Vitamin D deficiency disease   . Neuropathy (HCC)     both feet  . Dyslipidemia   . CKD (chronic kidney disease) stage 2, GFR 60-89 ml/min   . Lumbar spinal stenosis    Diabetes in the family on father's side  Interim medical history since our last visit reviewed. Allergies and medications reviewed and updated.  Review of Systems  Musculoskeletal: Negative for back pain.  Neurological:  Positive for numbness.   Per HPI unless specifically indicated above     Objective:    BP 137/82 mmHg  Pulse 64  Temp(Src) 97.5 F (36.4 C)  Wt 199 lb (90.266 kg)  SpO2 98%  LMP  (LMP Unknown)  Wt Readings from Last 3 Encounters:  05/07/15 199 lb (90.266 kg)  01/07/15 201 lb (91.173 kg)  10/14/14 199 lb (90.266 kg)    Physical Exam  Constitutional: She appears well-developed and well-nourished. No distress.  HENT:  Head: Normocephalic and atraumatic.  Eyes: EOM are normal. No scleral icterus.  Neck: No thyromegaly present.  Cardiovascular: Normal rate, regular rhythm and normal heart sounds.   No murmur heard. Pulmonary/Chest: Effort normal and breath sounds normal. No respiratory distress. She has no wheezes.  Abdominal: Soft. Bowel sounds are normal. She exhibits no distension.  Musculoskeletal: Normal range of motion. She exhibits no edema.       Lumbar back: She exhibits deformity (scoliosis, left side prominent). She exhibits no tenderness and no pain.  Neurological: She is alert. She exhibits normal muscle tone.  Reflex Scores:      Patellar reflexes are 2+ on the right side and 2+ on the left side. LE strength 5/5  Skin: Skin is warm and dry. She is not diaphoretic. No  pallor.  Psychiatric: She has a normal mood and affect. Her behavior is normal. Judgment and thought content normal.    Results for orders placed or performed in visit on 12/18/14  Basic metabolic panel  Result Value Ref Range   Glucose 147 (H) 65 - 99 mg/dL   BUN 16 8 - 27 mg/dL   Creatinine, Ser 9.14 0.57 - 1.00 mg/dL   GFR calc non Af Amer 62 >59 mL/min/1.73   GFR calc Af Amer 72 >59 mL/min/1.73   BUN/Creatinine Ratio 17 11 - 26   Sodium 142 134 - 144 mmol/L   Potassium 4.1 3.5 - 5.2 mmol/L   Chloride 99 97 - 108 mmol/L   CO2 26 18 - 29 mmol/L   Calcium 9.6 8.7 - 10.3 mg/dL      Assessment & Plan:   Problem List Items Addressed This Visit      Cardiovascular and Mediastinum    Hypertension (Chronic)    Well-controlled today; continue thiazide diuretic; monitor electrolytes and creatinine and urine micoalbumin        Respiratory   Allergic rhinitis (Chronic)    Taking singulair during allergy seasons        Digestive   Acid reflux (Chronic)    Using H2 blocker PRN; avoid triggers        Nervous and Auditory   Neuropathy of both feet (HCC) - Primary    Check TSH, glucose, A1c, B12; start gabapentin; offered referral to neurologist if symptoms are too bothersome      Relevant Medications   gabapentin (NEURONTIN) 100 MG capsule   Other Relevant Orders   Hgb A1c w/o eAG   TSH   Vitamin B12     Musculoskeletal and Integument   Facet arthropathy, lumbar    She has seen back specialist in [place]; surgery not indicated at this time        Genitourinary   CKD (chronic kidney disease) stage 2, GFR 60-89 ml/min    Check creatinine and K+ and urine microalbumin; avoid NSAIDs      Relevant Orders   Microalbumin / creatinine urine ratio     Other   Vitamin D deficiency disease    Check vit D      Relevant Orders   VITAMIN D 25 Hydroxy (Vit-D Deficiency, Fractures)   Dyslipidemia    Check lipids      Relevant Orders   Lipid Panel w/o Chol/HDL Ratio   Foraminal stenosis of lumbar region    She saw the back surgeon already; took PT for 6-8 weeks; no back pain; surgeon did not want to operate, just arthritis at this point      Medication monitoring encounter   Relevant Orders   CBC with Differential/Platelet   Comprehensive metabolic panel    Other Visit Diagnoses    Need for hepatitis C screening test        Relevant Orders    Hepatitis C antibody    Verruca vulgaris        try Duofilm on the right thumb       Follow up plan: Return in about 6 months (around 11/04/2015) for thirty minute follow-up with fasting labs. Orders Placed This Encounter  Procedures  . CBC with Differential/Platelet  . Hgb A1c w/o eAG  . Lipid Panel  w/o Chol/HDL Ratio  . TSH  . Vitamin B12  . VITAMIN D 25 Hydroxy (Vit-D Deficiency, Fractures)  . Microalbumin / creatinine urine ratio  . Comprehensive metabolic panel  .  Hepatitis C antibody   Meds ordered this encounter  Medications  . gabapentin (NEURONTIN) 100 MG capsule    Sig: One by mouth at bedtime x 3 days, then one BID x 3 days, then one TID x 3 days    Dispense:  80 capsule    Refill:  0

## 2015-05-07 NOTE — Assessment & Plan Note (Signed)
Well-controlled today; continue thiazide diuretic; monitor electrolytes and creatinine and urine micoalbumin

## 2015-05-07 NOTE — Assessment & Plan Note (Addendum)
Check TSH, glucose, A1c, B12; start gabapentin; offered referral to neurologist if symptoms are too bothersome

## 2015-05-07 NOTE — Assessment & Plan Note (Signed)
Check lipids 

## 2015-05-07 NOTE — Assessment & Plan Note (Signed)
She saw the back surgeon already; took PT for 6-8 weeks; no back pain; surgeon did not want to operate, just arthritis at this point

## 2015-05-07 NOTE — Assessment & Plan Note (Signed)
She has seen back specialist in [place]; surgery not indicated at this time

## 2015-05-09 LAB — VITAMIN D 25 HYDROXY (VIT D DEFICIENCY, FRACTURES): Vit D, 25-Hydroxy: 22 ng/mL — ABNORMAL LOW (ref 30.0–100.0)

## 2015-05-09 LAB — COMPREHENSIVE METABOLIC PANEL WITH GFR
ALT: 23 [IU]/L (ref 0–32)
AST: 19 [IU]/L (ref 0–40)
Albumin/Globulin Ratio: 1.5 (ref 1.1–2.5)
Albumin: 4 g/dL (ref 3.5–4.8)
Alkaline Phosphatase: 81 [IU]/L (ref 39–117)
BUN/Creatinine Ratio: 13 (ref 11–26)
BUN: 12 mg/dL (ref 8–27)
Bilirubin Total: 0.5 mg/dL (ref 0.0–1.2)
CO2: 26 mmol/L (ref 18–29)
Calcium: 9.3 mg/dL (ref 8.7–10.3)
Chloride: 101 mmol/L (ref 96–106)
Creatinine, Ser: 0.92 mg/dL (ref 0.57–1.00)
GFR calc Af Amer: 73 mL/min/{1.73_m2}
GFR calc non Af Amer: 63 mL/min/{1.73_m2}
Globulin, Total: 2.6 g/dL (ref 1.5–4.5)
Glucose: 94 mg/dL (ref 65–99)
Potassium: 4.1 mmol/L (ref 3.5–5.2)
Sodium: 142 mmol/L (ref 134–144)
Total Protein: 6.6 g/dL (ref 6.0–8.5)

## 2015-05-09 LAB — CBC WITH DIFFERENTIAL/PLATELET
Basophils Absolute: 0 10*3/uL (ref 0.0–0.2)
Basos: 1 %
EOS (ABSOLUTE): 0.3 10*3/uL (ref 0.0–0.4)
Eos: 5 %
Hematocrit: 39.7 % (ref 34.0–46.6)
Hemoglobin: 13.4 g/dL (ref 11.1–15.9)
Immature Grans (Abs): 0 10*3/uL (ref 0.0–0.1)
Immature Granulocytes: 0 %
Lymphocytes Absolute: 2 10*3/uL (ref 0.7–3.1)
Lymphs: 34 %
MCH: 31.9 pg (ref 26.6–33.0)
MCHC: 33.8 g/dL (ref 31.5–35.7)
MCV: 95 fL (ref 79–97)
Monocytes Absolute: 0.4 10*3/uL (ref 0.1–0.9)
Monocytes: 7 %
Neutrophils Absolute: 3 10*3/uL (ref 1.4–7.0)
Neutrophils: 53 %
Platelets: 192 10*3/uL (ref 150–379)
RBC: 4.2 x10E6/uL (ref 3.77–5.28)
RDW: 13.6 % (ref 12.3–15.4)
WBC: 5.7 10*3/uL (ref 3.4–10.8)

## 2015-05-09 LAB — MICROALBUMIN / CREATININE URINE RATIO
Creatinine, Urine: 18.8 mg/dL
MICROALB/CREAT RATIO: <16 mg/g{creat} (ref 0.0–30.0)
Microalbumin, Urine: <3 ug/mL

## 2015-05-09 LAB — LIPID PANEL W/O CHOL/HDL RATIO
Cholesterol, Total: 177 mg/dL (ref 100–199)
HDL: 47 mg/dL
LDL Calculated: 90 mg/dL (ref 0–99)
Triglycerides: 199 mg/dL — ABNORMAL HIGH (ref 0–149)
VLDL Cholesterol Cal: 40 mg/dL (ref 5–40)

## 2015-05-09 LAB — VITAMIN B12: Vitamin B-12: 502 pg/mL (ref 211–946)

## 2015-05-09 LAB — TSH: TSH: 2.23 u[IU]/mL (ref 0.450–4.500)

## 2015-05-09 LAB — HGB A1C W/O EAG: Hgb A1c MFr Bld: 5.7 % — ABNORMAL HIGH (ref 4.8–5.6)

## 2015-05-13 ENCOUNTER — Telehealth: Payer: Self-pay | Admitting: Family Medicine

## 2015-05-13 NOTE — Telephone Encounter (Signed)
I spoke with patient again, she was appreciative of me reviewing them with her again. She felt like she understood much better.

## 2015-05-13 NOTE — Telephone Encounter (Signed)
Routing to provider  

## 2015-05-13 NOTE — Telephone Encounter (Signed)
I spoke with patient and she wants Amy to call her back and go over her labs. She said that when she talked to her over the phone she didn't understand the results because she couldn't hear because she was at a store. She said she sees that there are results that are high but needs to better explanation.

## 2015-05-13 NOTE — Telephone Encounter (Signed)
Pt came in requesting a copy of her labs, contacted the lab pt stated she does not understand the lab print out wants a letter explaining what the lab values mean. Please contact pt when this is ready for pick up. Thanks.

## 2015-05-13 NOTE — Telephone Encounter (Signed)
I think if you'll print out the labs with all of my explanations, that should help; appt if needed

## 2015-05-13 NOTE — Telephone Encounter (Signed)
That's what I did for her on Monday or Tuesday. I talked to her about them twice and printed out with notes for her already.

## 2015-05-13 NOTE — Telephone Encounter (Signed)
I'm not sure what else I need to do; offer appt if she has questions

## 2015-05-14 ENCOUNTER — Encounter: Payer: Self-pay | Admitting: Family Medicine

## 2015-05-14 LAB — HCV COMMENT:

## 2015-05-14 LAB — HEPATITIS C ANTIBODY (REFLEX): HCV Ab: <0.1 {s_co_ratio} (ref 0.0–0.9)

## 2015-05-14 LAB — SPECIMEN STATUS REPORT

## 2015-05-24 ENCOUNTER — Other Ambulatory Visit: Payer: Self-pay | Admitting: Family Medicine

## 2015-05-24 ENCOUNTER — Ambulatory Visit (INDEPENDENT_AMBULATORY_CARE_PROVIDER_SITE_OTHER): Payer: Commercial Managed Care - HMO | Admitting: Family Medicine

## 2015-05-24 ENCOUNTER — Encounter: Payer: Self-pay | Admitting: Family Medicine

## 2015-05-24 VITALS — BP 137/81 | HR 61 | Temp 97.6°F | Ht 65.5 in | Wt 200.6 lb

## 2015-05-24 DIAGNOSIS — Z720 Tobacco use: Secondary | ICD-10-CM | POA: Diagnosis not present

## 2015-05-24 DIAGNOSIS — Z0000 Encounter for general adult medical examination without abnormal findings: Secondary | ICD-10-CM | POA: Diagnosis not present

## 2015-05-24 DIAGNOSIS — G5793 Unspecified mononeuropathy of bilateral lower limbs: Secondary | ICD-10-CM

## 2015-05-24 DIAGNOSIS — R29898 Other symptoms and signs involving the musculoskeletal system: Secondary | ICD-10-CM | POA: Diagnosis not present

## 2015-05-24 DIAGNOSIS — Z87891 Personal history of nicotine dependence: Secondary | ICD-10-CM

## 2015-05-24 MED ORDER — ASPIRIN EC 81 MG PO TBEC: 81.0000 mg | DELAYED_RELEASE_TABLET | Freq: Every day | ORAL | Status: AC

## 2015-05-24 MED ORDER — GABAPENTIN 300 MG PO CAPS: 300.0000 mg | ORAL_CAPSULE | Freq: Two times a day (BID) | ORAL | Status: DC

## 2015-05-24 NOTE — Telephone Encounter (Signed)
90 day supply requested; Rx approved

## 2015-05-24 NOTE — Progress Notes (Signed)
Patient ID: Dana Peters, female   DOB: 09-29-1945, 70 y.o.   MRN: [id number]  Subjective:   Dana Peters is a 70 y.o. female here for a complete physical exam  Interim issues since last visit: she has neuropathy in both feet and legs; she has not seen a neurologist for this; bothered with this for 5 years, but getting worse; she does have heaviness in the legs; on gabapentin for just two weeks; she can tell some improvement already  USPSTF grade A and B recommendations Alcohol: no alcohol Depression:  Depression screen [facility] 2/9 05/24/2015 01/07/2015 10/14/2014  Decreased Interest 0 0 0  Down, Depressed, Hopeless 1 0 0  PHQ - 2 Score 1 0 0   Hypertension: well-controlled Obesity: her husband is going on a diet and she'll have incentive to not cook as much; she'll try to eat better Tobacco use: used to smoke, quit in 2006; about 1 pack a day, for 30-35 years HIV, hep B, hep C: UTD STD testing and prevention (chl/gon/syphilis): asx Lipids: just checked Glucose: just checked Colorectal cancer: 2016; next in 5 years (not 10 years as in [facility]) Breast cancer: she'll wait until December 2015, she'll do every two years now Cervical cancer screening: s/p hystectomy, noncancerous reason for surgery Lung cancer: ordering CT today Osteoporosis: 2016 Fall prevention/vitamin D: taking vit D; discussed fall precautions AAA: negative Aspirin: taking 81 mg daily Diet: going to start working on that Exercise: used to do exercise, but legs are bothering her Skin cancer: no worrisome moles; mole taken off of the right thumb  Past Medical History  Diagnosis Date  . Reflux   . GERD (gastroesophageal reflux disease)   . Hypertension   . Allergy   . Venous stasis   . Sciatica   . Vitamin D deficiency disease   . Neuropathy (HCC)     both feet  . Dyslipidemia   . CKD (chronic kidney disease) stage 2, GFR 60-89 ml/min   . Lumbar spinal stenosis    Past Surgical History  Procedure  Laterality Date  . Ectopic pregnancy surgery    . Abdominal hysterectomy      complete, due to heavy   Family History  Problem Relation Age of Onset  . Cancer Mother     colon  . Hypertension Mother   . Alzheimer's disease Mother   . Cancer Father     prostate  . Cancer Brother     lung  . Stroke Brother   . Cancer Brother     prostate and brain  . Cancer Brother     throat  . Diabetes Paternal Aunt    Social History  Substance Use Topics  . Smoking status: Former Smoker    Quit date: 10/13/2004  . Smokeless tobacco: Former Neurosurgeon    Types: Snuff    Quit date: 10/09/2011  . Alcohol Use: Yes     Comment: Rare   Review of Systems  Constitutional: Negative for fever and chills.  HENT: Negative for hearing loss.   Eyes: Positive for visual disturbance (just a little blurry; saw eye doctor; getting early cataracts, not ripe yet).  Respiratory: Negative for wheezing.   Cardiovascular: Negative for chest pain.  Gastrointestinal: Negative for blood in stool.  Endocrine: Negative for polydipsia.  Genitourinary: Negative for hematuria.       No urinary leakage  Musculoskeletal: Negative for arthralgias.  Allergic/Immunologic: Negative for food allergies.  Neurological: Positive for numbness.  Hematological: Negative for adenopathy. Does  not bruise/bleed easily.  Psychiatric/Behavioral: Negative for dysphoric mood.   Objective:   Filed Vitals:   05/24/15 1026  BP: 137/81  Pulse: 61  Temp: 97.6 F (36.4 C)  Height: 5' 5.5" (1.664 m)  Weight: 200 lb 9.6 oz (90.992 kg)  SpO2: 96%   Body mass index is 32.86 kg/(m^2). Wt Readings from Last 3 Encounters:  05/24/15 200 lb 9.6 oz (90.992 kg)  05/07/15 199 lb (90.266 kg)  01/07/15 201 lb (91.173 kg)   Physical Exam  Constitutional: She appears well-developed and well-nourished.  HENT:  Head: Normocephalic and atraumatic.  Right Ear: Hearing, tympanic membrane, external ear and ear canal normal.  Left Ear: Hearing,  tympanic membrane, external ear and ear canal normal.  Eyes: Conjunctivae and EOM are normal. Right eye exhibits no hordeolum. Left eye exhibits no hordeolum. No scleral icterus.  Neck: Carotid bruit is not present. No thyromegaly present.  Cardiovascular: Normal rate, regular rhythm, S1 normal, S2 normal and normal heart sounds.   No extrasystoles are present.  Pulmonary/Chest: Effort normal and breath sounds normal. No respiratory distress. Right breast exhibits no inverted nipple, no mass, no nipple discharge, no skin change and no tenderness. Left breast exhibits no inverted nipple, no mass, no nipple discharge, no skin change and no tenderness. Breasts are symmetrical.  Abdominal: Soft. Normal appearance and bowel sounds are normal. She exhibits no distension, no abdominal bruit, no pulsatile midline mass and no mass. There is no hepatosplenomegaly. There is no tenderness. No hernia.  Musculoskeletal: Normal range of motion. She exhibits no edema.  Lymphadenopathy:       Head (right side): No submandibular adenopathy present.       Head (left side): No submandibular adenopathy present.    She has no cervical adenopathy.    She has no axillary adenopathy.  Neurological: She is alert. She displays no tremor. No cranial nerve deficit. She exhibits normal muscle tone. Gait normal.  Reflex Scores:      Patellar reflexes are 2+ on the right side and 2+ on the left side. Skin: Skin is warm and dry. No bruising and no ecchymosis noted. No cyanosis. No pallor.  Psychiatric: Her speech is normal and behavior is normal. Thought content normal. Her mood appears not anxious. She does not exhibit a depressed mood.    Assessment/Plan:   Problem List Items Addressed This Visit      Nervous and Auditory   Neuropathy of both feet (HCC)    Refer to neurologist, increase gabapentin      Relevant Orders   Ambulatory referral to Neurology     Other   Leg heaviness    With neuropathy; refer to  neurologist      Relevant Orders   Ambulatory referral to Neurology   Preventative health care - Primary    USPSTF grade A and B recommendations reviewed with patient; age-appropriate recommendations, preventive care, screening tests, etc discussed and encouraged; healthy living encouraged; see AVS for patient education given to patient       Other Visit Diagnoses    Hx of smoking        Relevant Orders    CT CHEST LUNG CA SCREEN LOW DOSE W/O CM       Meds ordered this encounter  Medications  . DISCONTD: gabapentin (NEURONTIN) 300 MG capsule    Sig: Take 1 capsule (300 mg total) by mouth 2 (two) times daily.    Dispense:  60 capsule    Refill:  0  New dose, instructions  . aspirin EC 81 MG tablet    Sig: Take 1 tablet (81 mg total) by mouth daily.    Dispense:  30 tablet    Refill:  11   Orders Placed This Encounter  Procedures  . CT CHEST LUNG CA SCREEN LOW DOSE W/O CM    Order Specific Question:  Reason for Exam (SYMPTOM  OR DIAGNOSIS REQUIRED)    Answer:  hx of smoking, 30-35 pack years; quit 2006    Order Specific Question:  Preferred Imaging Location?    Answer:  [facility]  . Ambulatory referral to Neurology    Referral Priority:  Routine    Referral Type:  Consultation    Referral Reason:  Specialty Services Required    Requested Specialty:  Neurology    Number of Visits Requested:  1   Follow up plan: Return in about 1 year (around 05/23/2016) for complete physical.  An after-visit summary was printed and given to the patient at check-out.  Please see the patient instructions which may contain other information and recommendations beyond what is mentioned above in the assessment and plan. Orders Placed This Encounter  Procedures  . CT CHEST LUNG CA SCREEN LOW DOSE W/O CM  . Ambulatory referral to Neurology

## 2015-05-24 NOTE — Assessment & Plan Note (Signed)
With neuropathy; refer to neurologist

## 2015-05-24 NOTE — Assessment & Plan Note (Signed)
Refer to neurologist, increase gabapentin

## 2015-05-24 NOTE — Patient Instructions (Addendum)
I've put in an order for a low dose chest CT for lung cancer screening If you have not heard anything from my staff in a week about any orders/referrals/studies from today, please contact us here to follow-up [phone] Please do call to schedule your mammogram; the number to schedule one at either [facility] or [facility] is [phone] Let's increase your gabapentin, 300 mg morning and night  Menopause is a normal process in which your reproductive ability comes to an end. This process happens gradually over a span of months to years, usually between the ages of 46 and 107. Menopause is complete when you have missed 12 consecutive menstrual periods. It is important to talk with your health care provider about some of the most common conditions that affect postmenopausal women, such as heart disease, cancer, and bone loss (osteoporosis). Adopting a healthy lifestyle and getting preventive care can help to promote your health and wellness. Those actions can also lower your chances of developing some of these common conditions. WHAT SHOULD I KNOW ABOUT MENOPAUSE? During menopause, you may experience a number of symptoms, such as:  Moderate-to-severe hot flashes.  Night sweats.  Decrease in sex drive.  Mood swings.  Headaches.  Tiredness.  Irritability.  Memory problems.  Insomnia. Choosing to treat or not to treat menopausal changes is an individual decision that you make with your health care provider. WHAT SHOULD I KNOW ABOUT HORMONE REPLACEMENT THERAPY AND SUPPLEMENTS? Hormone therapy products are effective for treating symptoms that are associated with menopause, such as hot flashes and night sweats. Hormone replacement carries certain risks, especially as you become older. If you are thinking about using estrogen or estrogen with progestin treatments, discuss the benefits and risks with your health care provider. WHAT SHOULD I KNOW ABOUT HEART  DISEASE AND STROKE? Heart disease, heart attack, and stroke become more likely as you age. This may be due, in part, to the hormonal changes that your body experiences during menopause. These can affect how your body processes dietary fats, triglycerides, and cholesterol. Heart attack and stroke are both medical emergencies. There are many things that you can do to help prevent heart disease and stroke:  Have your blood pressure checked at least every 1-2 years. High blood pressure causes heart disease and increases the risk of stroke.  If you are 28-35 years old, ask your health care provider if you should take aspirin to prevent a heart attack or a stroke.  Do not use any tobacco products, including cigarettes, chewing tobacco, or electronic cigarettes. If you need help quitting, ask your health care provider.  It is important to eat a healthy diet and maintain a healthy weight.  Be sure to include plenty of vegetables, fruits, low-fat dairy products, and lean protein.  Avoid eating foods that are high in solid fats, added sugars, or salt (sodium).  Get regular exercise. This is one of the most important things that you can do for your health.  Try to exercise for at least 150 minutes each week. The type of exercise that you do should increase your heart rate and make you sweat. This is known as moderate-intensity exercise.  Try to do strengthening exercises at least twice each week. Do these in addition to the moderate-intensity exercise.  Know your numbers.Ask your health care provider to check your cholesterol and your blood glucose. Continue to have your blood tested as directed by your health care provider. WHAT SHOULD I KNOW ABOUT CANCER SCREENING?  There are several types of cancer. Take the following steps to reduce your risk and to catch any cancer development as early as possible. Breast Cancer  Practice breast self-awareness.  This means understanding how your breasts  normally appear and feel.  It also means doing regular breast self-exams. Let your health care provider know about any changes, no matter how small.  If you are 69 or older, have a clinician do a breast exam (clinical breast exam or CBE) every year. Depending on your age, family history, and medical history, it may be recommended that you also have a yearly breast X-ray (mammogram).  If you have a family history of breast cancer, talk with your health care provider about genetic screening.  If you are at high risk for breast cancer, talk with your health care provider about having an MRI and a mammogram every year.  Breast cancer (BRCA) gene test is recommended for women who have family members with BRCA-related cancers. Results of the assessment will determine the need for genetic counseling and BRCA1 and for BRCA2 testing. BRCA-related cancers include these types:  Breast. This occurs in males or females.  Ovarian.  Tubal. This may also be called fallopian tube cancer.  Cancer of the abdominal or pelvic lining (peritoneal cancer).  Prostate.  Pancreatic. Cervical, Uterine, and Ovarian Cancer Your health care provider may recommend that you be screened regularly for cancer of the pelvic organs. These include your ovaries, uterus, and vagina. This screening involves a pelvic exam, which includes checking for microscopic changes to the surface of your cervix (Pap test).  For women ages 21-65, health care providers may recommend a pelvic exam and a Pap test every three years. For women ages 79-65, they may recommend the Pap test and pelvic exam, combined with testing for human papilloma virus (HPV), every five years. Some types of HPV increase your risk of cervical cancer. Testing for HPV may also be done on women of any age who have unclear Pap test results.  Other health care providers may not recommend any screening for nonpregnant women who are considered low risk for pelvic cancer and  have no symptoms. Ask your health care provider if a screening pelvic exam is right for you.  If you have had past treatment for cervical cancer or a condition that could lead to cancer, you need Pap tests and screening for cancer for at least 20 years after your treatment. If Pap tests have been discontinued for you, your risk factors (such as having a new sexual partner) need to be reassessed to determine if you should start having screenings again. Some women have medical problems that increase the chance of getting cervical cancer. In these cases, your health care provider may recommend that you have screening and Pap tests more often.  If you have a family history of uterine cancer or ovarian cancer, talk with your health care provider about genetic screening.  If you have vaginal bleeding after reaching menopause, tell your health care provider.  There are currently no reliable tests available to screen for ovarian cancer. Lung Cancer Lung cancer screening is recommended for adults 65-39 years old who are at high risk for lung cancer because of a history of smoking. A yearly low-dose CT scan of the lungs is recommended if you:  Currently smoke.  Have a history of at least 30 pack-years of smoking and you currently smoke or have quit within the past 15 years. A pack-year is smoking an average of  one pack of cigarettes per day for one year. Yearly screening should:  Continue until it has been 15 years since you quit.  Stop if you develop a health problem that would prevent you from having lung cancer treatment. Colorectal Cancer  This type of cancer can be detected and can often be prevented.  Routine colorectal cancer screening usually begins at age 60 and continues through age 59.  If you have risk factors for colon cancer, your health care provider may recommend that you be screened at an earlier age.  If you have a family history of colorectal cancer, talk with your health care  provider about genetic screening.  Your health care provider may also recommend using home test kits to check for hidden blood in your stool.  A small camera at the end of a tube can be used to examine your colon directly (sigmoidoscopy or colonoscopy). This is done to check for the earliest forms of colorectal cancer.  Direct examination of the colon should be repeated every 5-10 years until age 8. However, if early forms of precancerous polyps or small growths are found or if you have a family history or genetic risk for colorectal cancer, you may need to be screened more often. Skin Cancer  Check your skin from head to toe regularly.  Monitor any moles. Be sure to tell your health care provider:  About any new moles or changes in moles, especially if there is a change in a mole's shape or color.  If you have a mole that is larger than the size of a pencil eraser.  If any of your family members has a history of skin cancer, especially at a young age, talk with your health care provider about genetic screening.  Always use sunscreen. Apply sunscreen liberally and repeatedly throughout the day.  Whenever you are outside, protect yourself by wearing long sleeves, pants, a wide-brimmed hat, and sunglasses. WHAT SHOULD I KNOW ABOUT OSTEOPOROSIS? Osteoporosis is a condition in which bone destruction happens more quickly than new bone creation. After menopause, you may be at an increased risk for osteoporosis. To help prevent osteoporosis or the bone fractures that can happen because of osteoporosis, the following is recommended:  If you are 66-33 years old, get at least 1,000 mg of calcium and at least 600 mg of vitamin D per day.  If you are older than age 27 but younger than age 33, get at least 1,200 mg of calcium and at least 600 mg of vitamin D per day.  If you are older than age 70, get at least 1,200 mg of calcium and at least 800 mg of vitamin D per day. Smoking and excessive  alcohol intake increase the risk of osteoporosis. Eat foods that are rich in calcium and vitamin D, and do weight-bearing exercises several times each week as directed by your health care provider. WHAT SHOULD I KNOW ABOUT HOW MENOPAUSE AFFECTS [place]? Depression may occur at any age, but it is more common as you become older. Common symptoms of depression include:  Low or sad mood.  Changes in sleep patterns.  Changes in appetite or eating patterns.  Feeling an overall lack of motivation or enjoyment of activities that you previously enjoyed.  Frequent crying spells. Talk with your health care provider if you think that you are experiencing depression. WHAT SHOULD I KNOW ABOUT IMMUNIZATIONS? It is important that you get and maintain your immunizations. These include:  Tetanus, diphtheria, and pertussis (Tdap)  booster vaccine.  Influenza every year before the flu season begins.  Pneumonia vaccine.  Shingles vaccine. Your health care provider may also recommend other immunizations.   This information is not intended to replace advice given to you by your health care provider. Make sure you discuss any questions you have with your health care provider.   Document Released: 05/19/2005 Document Revised: 04/17/2014 Document Reviewed: 11/27/2013 Elsevier Interactive Patient Education [employer].

## 2015-06-05 DIAGNOSIS — Z0000 Encounter for general adult medical examination without abnormal findings: Secondary | ICD-10-CM | POA: Insufficient documentation

## 2015-06-05 NOTE — Assessment & Plan Note (Signed)
USPSTF grade A and B recommendations reviewed with patient; age-appropriate recommendations, preventive care, screening tests, etc discussed and encouraged; healthy living encouraged; see AVS for patient education given to patient  

## 2015-06-16 ENCOUNTER — Other Ambulatory Visit (INDEPENDENT_AMBULATORY_CARE_PROVIDER_SITE_OTHER): Payer: Commercial Managed Care - HMO

## 2015-06-16 ENCOUNTER — Ambulatory Visit (INDEPENDENT_AMBULATORY_CARE_PROVIDER_SITE_OTHER): Payer: Commercial Managed Care - HMO | Admitting: Neurology

## 2015-06-16 ENCOUNTER — Encounter: Payer: Self-pay | Admitting: Neurology

## 2015-06-16 VITALS — BP 120/80 | HR 77 | Ht 68.0 in | Wt 200.0 lb

## 2015-06-16 DIAGNOSIS — G609 Hereditary and idiopathic neuropathy, unspecified: Secondary | ICD-10-CM

## 2015-06-16 LAB — SEDIMENTATION RATE: Sed Rate: 23 mm/h — ABNORMAL HIGH (ref 0–22)

## 2015-06-16 NOTE — Patient Instructions (Signed)
1.  Increase gabapentin to 300mg  twice daily 2.  Check blood work 3.  NCS/EMG of the legs  Return to clinic in 3 months

## 2015-06-16 NOTE — Progress Notes (Signed)
[facility] Clinic Note - Initial Visit   Date: 06/16/2015  [name] MRN: [id number] DOB: 1945-09-05   Dear Dr. Sanda Klein:  Thank you for your kind referral of Dana Peters for consultation of neuropathy. Although her history is well known to you, please allow Dana Peters to reiterate it for the purpose of our medical record. The patient was accompanied to the clinic by self.    History of Present Illness: Dana Peters is a 70 y.o. right-handed African American female with hypertension and prior tobacco use presenting for evaluation of neuropathy.    Starting around 2007, she began experiencing slow and insiduous onset of tingling and swelling of the feet. She has burning, tingling, and numbness over the balls of both feet and into the toes.  It does not involve the top of the foot, heel, or above the ankles.  She denies any weakness or imbalance. Symptoms are constant and improved by walking and essential oils.  Constricted shoes exacerbates paresthesias.  She has not had any falls and walks independently. She saw podiatry for quite some time for foot pain.  She then mentioned these symptoms to her new PCP in 2016 who started her on gabapentin 337m in the morning which has alleviated by pain 50%.  She was screened for vitamin B12 deficiency, thyroid abnormalities, and diabetes which returned normal.  Out-side paper records, electronic medical record, and images have been reviewed where available and summarized as:  MRI lumbar spine wo contrast 01/26/2015: 1. There is grade 1 anterolisthesis of L5 on S1 secondary to severe bilateral facet arthropathy. There is severe left foraminal stenosis. There is a left lateral disc osteophyte complex. 2. At L4-5 there is a mild broad-based disc bulge with severe right and moderate left facet arthropathy. Mild spinal stenosis. Right lateral recess stenosis. 3. At L3-4 there is a mild broad-based disc bulge and mild  bilateral facet arthropathy.  Lab Results  Component Value Date   TSH 2.230 05/07/2015   Lab Results  Component Value Date   [id number] 81801/27/2017   Lab Results  Component Value Date   CHOL 177 05/07/2015   HDL 47 05/07/2015   LDLCALC 90 05/07/2015   TRIG 199* 05/07/2015   Lab Results  Component Value Date   HGBA1C 5.7* 05/07/2015    Past Medical History  Diagnosis Date  . Reflux   . GERD (gastroesophageal reflux disease)   . Hypertension   . Allergy   . Venous stasis   . Sciatica   . Vitamin D deficiency disease   . Neuropathy ([place]     both feet  . Dyslipidemia   . CKD (chronic kidney disease) stage 2, GFR 60-89 ml/min   . Lumbar spinal stenosis     Past Surgical History  Procedure Laterality Date  . Ectopic pregnancy surgery    . Abdominal hysterectomy      complete, due to heavy     Medications:  Outpatient Encounter Prescriptions as of 06/16/2015  Medication Sig Note  . aspirin EC 81 MG tablet Take 1 tablet (81 mg total) by mouth daily.   .Marland Kitchengabapentin (NEURONTIN) 300 MG capsule TAKE ONE CAPSULE BY MOUTH TWICE DAILY   . hydrochlorothiazide (MICROZIDE) 12.5 MG capsule TAKE ONE CAPSULE BY MOUTH EVERY DAY   . mometasone (NASONEX) 50 MCG/ACT nasal spray Place 2 sprays into the nose daily.   . montelukast (SINGULAIR) 10 MG tablet Take 1 tablet (10 mg total) by mouth at bedtime. (Patient taking  differently: Take 10 mg by mouth daily as needed. )   . [DISCONTINUED] gabapentin (NEURONTIN) 100 MG capsule  06/16/2015: Received from: External Pharmacy   No facility-administered encounter medications on file as of 06/16/2015.     Allergies:  Allergies  Allergen Reactions  . Codeine Sulfate Itching  . Protonix [Pantoprazole Sodium] Other (See Comments)    Legs felt heavy  . Tylenol With Codeine #3  [Acetaminophen-Codeine] Itching  . Penicillin G Rash    Family History: Family History  Problem Relation Age of Onset  . Cancer Mother     colon  .  Hypertension Mother   . Alzheimer's disease Mother   . Cancer Father     prostate  . Cancer Brother     lung  . Stroke Brother   . Cancer Brother     prostate and brain  . Cancer Brother     throat  . Diabetes Paternal Aunt   . Healthy Daughter     Social History: Social History  Substance Use Topics  . Smoking status: Former Smoker -- 1.00 packs/day for 30 years    Types: Cigarettes    Quit date: 10/13/2004  . Smokeless tobacco: Former Systems developer    Types: Snuff    Quit date: 10/09/2011  . Alcohol Use: 0.0 oz/week    0 Standard drinks or equivalent per week     Comment: Rare   Social History   Social History Narrative   Lives with husband in a one story home.  Has one daughter.     Retired from [employer] work.     Education: high school.    Review of Systems:  CONSTITUTIONAL: No fevers, chills, night sweats, or weight loss.   EYES: No visual changes or eye pain ENT: No hearing changes.  No history of nose bleeds.   RESPIRATORY: No cough, wheezing and shortness of breath.   CARDIOVASCULAR: Negative for chest pain, and palpitations.   GI: Negative for abdominal discomfort, blood in stools or black stools.  No recent change in bowel habits.   GU:  No history of incontinence.   MUSCLOSKELETAL: No history of joint pain or swelling.  No myalgias.   SKIN: Negative for lesions, rash, and itching.   HEMATOLOGY/ONCOLOGY: Negative for prolonged bleeding, bruising easily, and swollen nodes.  No history of cancer.   ENDOCRINE: Negative for cold or heat intolerance, polydipsia or goiter.   PSYCH:  No depression or anxiety symptoms.   NEURO: As Above.   Vital Signs:  BP 120/80 mmHg  Pulse 77  Ht 5' 8"  (1.727 m)  Wt 200 lb (90.719 kg)  BMI 30.42 kg/m2  SpO2 94%  LMP  (LMP Unknown) Pain Scale: 0 on a scale of 0-10   General Medical Exam:   General:  Well appearing, comfortable.   Eyes/ENT: see cranial nerve examination.   Neck: No masses appreciated.  Full range of motion  without tenderness.  No carotid bruits. Respiratory:  Clear to auscultation, good air entry bilaterally.   Cardiac:  Regular rate and rhythm, no murmur.   Extremities:  No deformities, edema, or skin discoloration.  Skin:  No rashes or lesions.  Neurological Exam: MENTAL STATUS including orientation to time, place, person, recent and remote memory, attention span and concentration, language, and fund of knowledge is normal.  Speech is not dysarthric.  CRANIAL NERVES: II:  No visual field defects.  Unremarkable fundi.   III-IV-VI: Pupils equal round and reactive to light.  Normal conjugate, extra-ocular eye movements  in all directions of gaze.  No nystagmus.  No ptosis.   V:  Normal facial sensation.    VII:  Normal facial symmetry and movements.    VIII:  Normal hearing and vestibular function.   IX-X:  Normal palatal movement.   XI:  Normal shoulder shrug and head rotation.   XII:  Normal tongue strength and range of motion, no deviation or fasciculation.  MOTOR:  No atrophy, fasciculations or abnormal movements.  No pronator drift.  Tone is normal.    Right Upper Extremity:    Left Upper Extremity:    Deltoid  5/5   Deltoid  5/5   Biceps  5/5   Biceps  5/5   Triceps  5/5   Triceps  5/5   Wrist extensors  5/5   Wrist extensors  5/5   Wrist flexors  5/5   Wrist flexors  5/5   Finger extensors  5/5   Finger extensors  5/5   Finger flexors  5/5   Finger flexors  5/5   Dorsal interossei  5/5   Dorsal interossei  5/5   Abductor pollicis  5/5   Abductor pollicis  5/5   Tone (Ashworth scale)  0  Tone (Ashworth scale)  0   Right Lower Extremity:    Left Lower Extremity:    Hip flexors  5/5   Hip flexors  5/5   Hip extensors  5/5   Hip extensors  5/5   Knee flexors  5/5   Knee flexors  5/5   Knee extensors  5/5   Knee extensors  5/5   Dorsiflexors  5/5   Dorsiflexors  5/5   Plantarflexors  5/5   Plantarflexors  5/5   Toe extensors  5/5   Toe extensors  5/5   Toe flexors  5-/5   Toe  flexors  5-/5   Tone (Ashworth scale)  0  Tone (Ashworth scale)  0   MSRs:  Right                                                                 Left brachioradialis 2+  brachioradialis 2+  biceps 2+  biceps 2+  triceps 2+  triceps 2+  patellar 2+  patellar 2+  ankle jerk trace  ankle jerk trace  Hoffman no  Hoffman no  plantar response down  plantar response down   SENSORY:  Normal and symmetric perception of light touch, pinprick, vibration, and proprioception.  Romberg's sign absent.   COORDINATION/GAIT: Normal finger-to- nose-finger and heel-to-shin.  Intact rapid alternating movements bilaterally.  Able to rise from a chair without using arms.  Gait narrow based and stable. Tandem and stressed gait intact.    IMPRESSION: Mrs. Rissmiller is a delightful 70 year-old female referred for evaluation of bilateral feet paresthesias.  Her neurological examination shows reduced reflexes distally and toe flexion weakness.  Surprisingly her sensation to all modalities is intact and there is no sensory ataxia.  Based on her history of and exam, she most likely does have mild idiopathic peripheral neuropathy.  Because S1 radiculopathy can also manifest with paresthesias over the soles of the feet, I recommend that NCS/EMG is performed. Imaging of her lumbar spine was reviewed and shows lower lumbar facet arthopathy and  left foraminal stenosis involving the S1 nerve root on the left. Her vitamin B12, TSH, and HbA1c was checked and normal.  I had extensive discussion with the patient regarding the pathogenesis, etiology, management, and natural course of neuropathy. Neuropathy tends to be slowly progressive, especially if a treatable etiology is not identified.  I would like to test for treatable causes of neuropathy. I discussed that in the vast majority of cases, despite checking for reversible causes, we are unable to find the underlying etiology and management is symptomatic.     PLAN/RECOMMENDATIONS:    1.  NCS/EMG of the legs 2.  Check copper, SPEP/UPEP with IFE, ESR, SSA/B 3.  Increase gabapentin to 332m twice daily  Return to clinic in 3 months.   The duration of this appointment visit was 40 minutes of face-to-face time with the patient.  Greater than 50% of this time was spent in counseling, explanation of diagnosis, planning of further management, and coordination of care.   Thank you for allowing me to participate in patient's care.  If I can answer any additional questions, I would be pleased to do so.    Sincerely,    Zohair Epp K. PPosey Pronto DO

## 2015-06-17 LAB — SJOGREN'S SYNDROME ANTIBODS(SSA + SSB)
SSA (Ro) (ENA) Antibody, IgG: <1
SSB (La) (ENA) Antibody, IgG: <1

## 2015-06-18 LAB — PROTEIN ELECTROPHORESIS,RANDOM URN
Creatinine, Urine: 65 mg/dL (ref 20–320)
Protein Creatinine Ratio: 62 mg/g{creat} (ref 21–161)
Total Protein, Urine: 4 mg/dL — ABNORMAL LOW (ref 5–24)

## 2015-06-18 LAB — IMMUNOFIXATION INTE

## 2015-06-18 LAB — COPPER, SERUM: Copper: 131 ug/dL (ref 72–166)

## 2015-06-21 LAB — PROTEIN ELECTROPHORESIS, SERUM
Albumin ELP: 4 g/dL (ref 3.8–4.8)
Alpha-1-Globulin: 0.4 g/dL — ABNORMAL HIGH (ref 0.2–0.3)
Alpha-2-Globulin: 0.6 g/dL (ref 0.5–0.9)
Beta 2: 0.4 g/dL (ref 0.2–0.5)
Beta Globulin: 0.4 g/dL (ref 0.4–0.6)
Gamma Globulin: 1.1 g/dL (ref 0.8–1.7)
Total Protein, Serum Electrophoresis: 6.8 g/dL (ref 6.1–8.1)

## 2015-06-22 LAB — IMMUNOFIXATION ELECTROPHORESIS
IgA: 362 mg/dL (ref 69–380)
IgG (Immunoglobin G), Serum: 1160 mg/dL (ref 690–1700)
IgM, Serum: 88 mg/dL (ref 52–322)

## 2015-06-29 ENCOUNTER — Other Ambulatory Visit: Payer: Self-pay | Admitting: *Deleted

## 2015-06-29 ENCOUNTER — Ambulatory Visit (INDEPENDENT_AMBULATORY_CARE_PROVIDER_SITE_OTHER): Payer: Commercial Managed Care - HMO | Admitting: Neurology

## 2015-06-29 DIAGNOSIS — G609 Hereditary and idiopathic neuropathy, unspecified: Secondary | ICD-10-CM

## 2015-06-29 MED ORDER — GABAPENTIN 300 MG PO CAPS: 300.0000 mg | ORAL_CAPSULE | Freq: Two times a day (BID) | ORAL | Status: DC

## 2015-06-29 NOTE — Procedures (Signed)
[facility]  [address] [place], [address]  [place], [place] [address] Tel: [phone] Fax:  [phone] Test Date:  06/29/2015  Patient: Dana Peters DOB: 04/18/1945 Physician: Nita Sickle  Sex: Female Height:  Ref Phys: Nita Sickle  ID#: [id number] Temp: 32.9C Technician: Judie Petit. Dean   Patient Complaints: This is a 70 year old female referred for evaluation of bilateral feet paresthesias.  NCV & EMG Findings: Extensive electrodiagnostic testing of the right lower extremity and additional studies of the left shows: 1. Bilateral sural and superficial peroneal sensory responses are absent. 2. Bilateral peroneal and tibial motor responses are within normal limits. 3. Left tibial H reflex is mildly prolonged. Right tibial H reflex is within normal limits. 4. Sparse chronic motor axon loss changes are seen affecting the muscles below the knee and include bilateral tibialis anterior and the right flexor digitorum longus muscles. There is no evidence of accompanied active denervation.  Impression: The electrophysiologic findings are most consistent with a chronic, distal and symmetric sensorimotor polyneuropathy, axon loss in type, affecting the lower extremities.    _____________________________ Nita Sickle, D.O.    Nerve Conduction Studies Anti Sensory Summary Table   Stim Site NR Peak (ms) Norm Peak (ms) P-T Amp (V) Norm P-T Amp  Left Sup Peroneal Anti Sensory (Ant Lat Mall)  32.9C  12 cm NR  <4.6  >3  Right Sup Peroneal Anti Sensory (Ant Lat Mall)  32.9C  12 cm NR  <4.6  >3  Left Sural Anti Sensory (Lat Mall)  32.9C  Calf NR  <4.6  >3  Right Sural Anti Sensory (Lat Mall)  32.9C  Calf NR  <4.6  >3   Motor Summary Table   Stim Site NR Onset (ms) Norm Onset (ms) O-P Amp (mV) Norm O-P Amp Site1 Site2 Delta-0 (ms) Dist (cm) Vel (m/s) Norm Vel (m/s)  Left Peroneal Motor (Ext Dig Brev)  32.9C  Ankle    3.8 <6.0 2.7 >2.5 B Fib Ankle 8.0 35.0 44 >40  B  Fib    11.8  2.2  Poplt B Fib 2.1 10.0 48 >40  Poplt    13.9  2.1         Right Peroneal Motor (Ext Dig Brev)  32.9C  Ankle    3.8 <6.0 4.5 >2.5 B Fib Ankle 7.5 34.0 45 >40  B Fib    11.3  4.0  Poplt B Fib 2.0 10.0 50 >40  Poplt    13.3  3.8         Left Tibial Motor (Abd Hall Brev)  32.9C  Ankle    4.1 <6.0 5.6 >4 Knee Ankle 9.5 41.0 43 >40  Knee    13.6  5.0         Right Tibial Motor (Abd Hall Brev)  32.9C  Ankle    5.0 <6.0 7.5 >4 Knee Ankle 8.6 39.0 45 >40  Knee    13.6  5.7          H Reflex Studies   NR H-Lat (ms) Lat Norm (ms) L-R H-Lat (ms)  Left Tibial (Gastroc)  32.9C     36.73 <35 2.59  Right Tibial (Gastroc)  32.9C     34.15 <35 2.59   EMG   Side Muscle Ins Act Fibs Psw Fasc Number Recrt Dur Dur. Amp Amp. Poly Poly. Comment  Right GluteusMed Nml Nml Nml Nml Nml Nml Nml Nml Nml Nml Nml Nml N/A  Right AntTibialis Nml Nml Nml Nml 1- Rapid Some  1+ Some 1+ Nml Nml N/A  Right Gastroc Nml Nml Nml Nml Nml Nml Nml Nml Nml Nml Nml Nml N/A  Right RectFemoris Nml Nml Nml Nml Nml Nml Nml Nml Nml Nml Nml Nml N/A  Right Flex Dig Long Nml Nml Nml Nml 1- Rapid Some 1+ Some 1+ Nml Nml N/A  Left AntTibialis Nml Nml Nml Nml 1- Rapid Some 1+ Some 1+ Nml Nml N/A  Left Gastroc Nml Nml Nml Nml Nml Nml Nml Nml Nml Nml Nml Nml N/A  Left RectFemoris Nml Nml Nml Nml Nml Nml Nml Nml Nml Nml Nml Nml N/A      Waveforms:

## 2015-07-07 ENCOUNTER — Other Ambulatory Visit: Payer: Self-pay | Admitting: Family Medicine

## 2015-07-07 NOTE — Telephone Encounter (Signed)
Please resolve HCTZ request with pharmacy; I prescribed a 9 month supply in January

## 2015-07-07 NOTE — Telephone Encounter (Signed)
Pharmacy has refills on file.

## 2015-07-08 ENCOUNTER — Encounter: Payer: Self-pay | Admitting: Family Medicine

## 2015-07-08 NOTE — Progress Notes (Signed)
RE: can you check on chest CT?  Received: 3 days ago    Jonne PlyShawn P Perkins, RN  Kerman PasseyMelinda P Adley Mazurowski, MD           Got it, thanks. Sorry for some delay (was due to wrist surgery)       Previous Messages     ----- Message -----   From: Kerman PasseyMelinda P Christel Bai, MD   Sent: 06/29/2015 11:30 AM    To: Jonne PlyShawn P Perkins, RN  Subject: can you check on chest CT?            Low dose chest CT for screening; thanks!

## 2015-07-13 ENCOUNTER — Telehealth: Payer: Self-pay | Admitting: *Deleted

## 2015-07-13 NOTE — Telephone Encounter (Signed)
Patient contacted to verify information and schedule shared decision making visit and lung cancer screening CT scan. Patient confirms age within guidelines, denies signs or symptoms of lung cancer as well as other disease processes that would make the patient unable to receive treatment for a lung cancer. Patient verbalizes smoking quit date in 2007, 10 years ago, with 30 pack year history prior to quit date. Patient given appointment for 07/14/15 at 10:30am. Verbalizes understanding.

## 2015-07-14 ENCOUNTER — Other Ambulatory Visit: Payer: Self-pay | Admitting: Family Medicine

## 2015-07-14 ENCOUNTER — Inpatient Hospital Stay: Payer: Commercial Managed Care - HMO | Attending: Family Medicine | Admitting: Family Medicine

## 2015-07-14 ENCOUNTER — Encounter: Payer: Self-pay | Admitting: Family Medicine

## 2015-07-14 ENCOUNTER — Ambulatory Visit
Admission: RE | Admit: 2015-07-14 | Discharge: 2015-07-14 | Disposition: A | Discharge status: Home / Self Care | Payer: Commercial Managed Care - HMO | Source: Ambulatory Visit | Attending: Family Medicine | Admitting: Family Medicine

## 2015-07-14 ENCOUNTER — Inpatient Hospital Stay: Admission: RE | Admit: 2015-07-14 | Payer: Commercial Managed Care - HMO | Source: Ambulatory Visit

## 2015-07-14 DIAGNOSIS — Z87891 Personal history of nicotine dependence: Secondary | ICD-10-CM

## 2015-07-14 DIAGNOSIS — Z122 Encounter for screening for malignant neoplasm of respiratory organs: Secondary | ICD-10-CM | POA: Diagnosis not present

## 2015-07-14 DIAGNOSIS — I2510 Atherosclerotic heart disease of native coronary artery without angina pectoris: Secondary | ICD-10-CM | POA: Insufficient documentation

## 2015-07-14 DIAGNOSIS — Q2579 Other congenital malformations of pulmonary artery: Secondary | ICD-10-CM | POA: Diagnosis not present

## 2015-07-14 HISTORY — DX: Personal history of nicotine dependence: Z87.891

## 2015-07-14 NOTE — Progress Notes (Signed)
In accordance with CMS guidelines, patient has meet eligibility criteria including age, absence of signs or symptoms of lung cancer, the specific calculation of cigarette smoking pack-years was 30 years and is a former smoker having quit 10 years ago.   A shared decision-making session was conducted prior to the performance of CT scan. This includes one or more decision aids, includes benefits and harms of screening, follow-up diagnostic testing, over-diagnosis, false positive rate, and total radiation exposure.  Counseling on the importance of adherence to annual lung cancer LDCT screening, impact of co-morbidities, and ability or willingness to undergo diagnosis and treatment is imperative for compliance of the program.  Counseling on the importance of continued smoking cessation for former smokers; the importance of smoking cessation for current smokers and information about tobacco cessation interventions have been given to patient including the [facility], [address] quit [place], as well as Cancer Center specific smoking cessation programs.  Written order for lung cancer screening with LDCT has been given to the patient and any and all questions have been answered to the best of my abilities.   Yearly follow up will be scheduled by Shawn Perkins, Thoracic Navigator.   

## 2015-07-16 ENCOUNTER — Telehealth: Payer: Self-pay | Admitting: *Deleted

## 2015-07-16 NOTE — Telephone Encounter (Signed)
Notified patient of LDCT lung cancer screening results of Lung Rads 2 finding with recommendation for 12 month follow up imaging. Also notified of incidental finding noted below. Patient verbalizes understanding.   IMPRESSION: 1. Lung-RADS Category 2, benign appearance or behavior. Continue annual screening with low-dose chest CT without contrast in 12 months. 2. Borderline enlarged pulmonary arteries. 3. Coronary artery calcification.

## 2015-07-20 ENCOUNTER — Telehealth: Payer: Self-pay | Admitting: Family Medicine

## 2015-07-20 DIAGNOSIS — I2510 Atherosclerotic heart disease of native coronary artery without angina pectoris: Secondary | ICD-10-CM | POA: Insufficient documentation

## 2015-07-20 NOTE — Telephone Encounter (Signed)
I talked with patient about CT scan findings; next chest CT in one year Incidentally found coronary artery calcification; since I am all about prevention, will have her continue aspirin, refer to cardiologist; would rather discover blockages now than wait for something bad to happen, I explained; she agrees; referral entered

## 2015-09-09 ENCOUNTER — Encounter (INDEPENDENT_AMBULATORY_CARE_PROVIDER_SITE_OTHER): Payer: Self-pay

## 2015-09-09 ENCOUNTER — Ambulatory Visit (INDEPENDENT_AMBULATORY_CARE_PROVIDER_SITE_OTHER): Payer: Commercial Managed Care - HMO | Admitting: Cardiovascular Disease

## 2015-09-09 ENCOUNTER — Encounter: Payer: Self-pay | Admitting: Cardiovascular Disease

## 2015-09-09 VITALS — BP 132/81 | HR 62 | Ht 68.5 in | Wt 200.5 lb

## 2015-09-09 DIAGNOSIS — E785 Hyperlipidemia, unspecified: Secondary | ICD-10-CM

## 2015-09-09 DIAGNOSIS — I739 Peripheral vascular disease, unspecified: Secondary | ICD-10-CM | POA: Insufficient documentation

## 2015-09-09 DIAGNOSIS — I10 Essential (primary) hypertension: Secondary | ICD-10-CM

## 2015-09-09 DIAGNOSIS — Z0000 Encounter for general adult medical examination without abnormal findings: Secondary | ICD-10-CM

## 2015-09-09 DIAGNOSIS — I2510 Atherosclerotic heart disease of native coronary artery without angina pectoris: Secondary | ICD-10-CM

## 2015-09-09 MED ORDER — ATORVASTATIN CALCIUM 10 MG PO TABS: 10.0000 mg | ORAL_TABLET | Freq: Every day | ORAL | Status: DC

## 2015-09-09 NOTE — Progress Notes (Signed)
Patient ID: Dana Peters, female   DOB: November 29, 1945, 70 y.o.   MRN: [id number] Cardiology Office Note  Date:  09/09/2015   ID:  Dana Peters, DOB November 29, 1945, MRN [id number]  PCP:  Dana GoutyMelinda Lada, MD   Chief Complaint  Patient presents with  . Other    Discuss CT scan findings c/o elevated BP. Meds reviewed verbally with pt.    HPI:  [name] is a pleasant 70 year old woman with history of smoking for 30 years who stopped several years ago, obesity, Lipidemia, hypertension who presents by referral from Dr. Sherie Peters  for consultation for coronary artery calcifications Seen on CT scan.  She reports that she feels well, denies any significant shortness of breath or chest pain on exertion Active at baseline, does some regular walking, does some gardening.  She has lost some weight over the past year, trying to drink more water  Previous total cholesterol in the 190 range, most recently in the 170s with walking and water per the patient  CT scan images reviewed with her in detail   showing mild PAD in the right carotid, aortic arch, one region of mild to moderate plaque in the distal descending aorta  Mild coronary calcifications seen in the proximal left circumflex vessel, very mild calcification in the proximal RCA .  EKG on today's visit shows normal sinus rhythm with rate 62 bpm, no significant ST or T-wave changes    PMH:   has a past medical history of Reflux; GERD (gastroesophageal reflux disease); Hypertension; Allergy; Venous stasis; Sciatica; Vitamin D deficiency disease; Neuropathy (HCC); Dyslipidemia; Lumbar spinal stenosis; Personal history of tobacco use, presenting hazards to health (07/14/2015); and CKD (chronic kidney disease) stage 2, GFR 60-89 ml/min.  PSH:    Past Surgical History  Procedure Laterality Date  . Ectopic pregnancy surgery    . Abdominal hysterectomy      complete, due to heavy    Current Outpatient Prescriptions  Medication Sig Dispense Refill  .  aspirin EC 81 MG tablet Take 1 tablet (81 mg total) by mouth daily. 30 tablet 11  . Cholecalciferol (VITAMIN D) 2000 units CAPS Take by mouth daily.    . famotidine (PEPCID) 10 MG tablet Take 10 mg by mouth daily.    Marland Kitchen. gabapentin (NEURONTIN) 300 MG capsule Take 1 capsule (300 mg total) by mouth 2 (two) times daily. 180 capsule 3  . hydrochlorothiazide (MICROZIDE) 12.5 MG capsule TAKE ONE CAPSULE BY MOUTH EVERY DAY 90 capsule 2  . mometasone (NASONEX) 50 MCG/ACT nasal spray Place 2 sprays into the nose as needed.     . montelukast (SINGULAIR) 10 MG tablet Take 1 tablet (10 mg total) by mouth at bedtime. (Patient taking differently: Take 10 mg by mouth daily as needed. ) 30 tablet 11      Allergies:   Codeine sulfate; Protonix; Tylenol with codeine #3 ; and Penicillin g   Social History:  The patient  reports that she quit smoking about 9 years ago. Her smoking use included Cigarettes. She has a 30 pack-year smoking history. She quit smokeless tobacco use about 3 years ago. Her smokeless tobacco use included Snuff. She reports that she does not drink alcohol or use illicit drugs.   Family History:   family history includes Alzheimer's disease in her mother; Cancer in her brother, brother, brother, father, and mother; Diabetes in her paternal aunt; Healthy in her daughter; Hypertension in her mother; Stroke in her brother.    Review of Systems: Review  of Systems  Constitutional: Negative.   Respiratory: Negative.   Cardiovascular: Negative.   Gastrointestinal: Negative.   Musculoskeletal: Negative.   Neurological: Negative.   Psychiatric/Behavioral: Negative.   All other systems reviewed and are negative.    PHYSICAL EXAM: VS:  BP 132/81 mmHg  Pulse 62  Ht 5' 8.5" (1.74 m)  Wt 200 lb 8 oz (90.946 kg)  BMI 30.04 kg/m2  LMP  (LMP Unknown) , BMI Body mass index is 30.04 kg/(m^2). GEN: Well nourished, well developed, in no acute distress, mild obesity HEENT: normal Neck: no JVD,  carotid bruits, or masses Cardiac: RRR; no murmurs, rubs, or gallops,no edema  Respiratory:  clear to auscultation bilaterally, normal work of breathing GI: soft, nontender, nondistended, + BS MS: no deformity or atrophy Skin: warm and dry, no rash Neuro:  Strength and sensation are intact Psych: euthymic mood, full affect    Recent Labs: 05/07/2015: ALT 23; BUN 12; Creatinine, Ser 0.92; Platelets 192; Potassium 4.1; Sodium 142; TSH 2.230    Lipid Panel Lab Results  Component Value Date   CHOL 177 05/07/2015   HDL 47 05/07/2015   LDLCALC 90 05/07/2015   TRIG 199* 05/07/2015      Wt Readings from Last 3 Encounters:  09/09/15 200 lb 8 oz (90.946 kg)  07/14/15 193 lb (87.544 kg)  06/16/15 200 lb (90.719 kg)       ASSESSMENT AND PLAN:  1) Essential hypertension - Plan: EKG 12-Lead Blood pressure well controlled on today's visit. No changes to her medications  2) Coronary artery calcification seen on CT scan Very mild calcification noted in the proximal left circumflex vessel Long discussion with her concerning various preventative measures She is now a nonsmoker, no diabetes with hemoglobin A1c 5.7, Cholesterol is reasonable, improved with recent weight loss She has indicated she would like to be aggressive with her cholesterol to prevent progression We have recommended she start Lipitor 10 mg daily, recheck cholesterol in 3-6 months Goal total cholesterol less than 150  3) Preventative health care We have encouraged continued exercise, careful diet management in an effort to lose weight.   4) Dyslipidemia As above we will start Lipitor 10 mg daily, goal LDL less than 70  5) PAD  very mild disease noted in the right carotid, aortic arch, descending aorta Plan as above    Disposition:   F/U  as needed   Orders Placed This Encounter  Procedures  . EKG 12-Lead  Start Lipitor 10 mg daily    Signed, Dana Peters, M.D., Ph.D. 09/09/2015  [facility] [place], [place] [phone]

## 2015-09-09 NOTE — Patient Instructions (Signed)
You are doing well.  Please start atorvastatin/lipitor once a day  Please call us if you have new issues that need to be addressed before your next appt.  Follow up as needed

## 2015-11-09 ENCOUNTER — Ambulatory Visit (INDEPENDENT_AMBULATORY_CARE_PROVIDER_SITE_OTHER): Payer: Commercial Managed Care - HMO | Admitting: Family Medicine

## 2015-11-09 ENCOUNTER — Other Ambulatory Visit: Payer: Self-pay | Admitting: Family Medicine

## 2015-11-09 ENCOUNTER — Encounter: Payer: Self-pay | Admitting: Family Medicine

## 2015-11-09 VITALS — BP 124/78 | HR 79 | Temp 97.9°F | Resp 14 | Wt 207.0 lb

## 2015-11-09 DIAGNOSIS — M4806 Spinal stenosis, lumbar region: Secondary | ICD-10-CM | POA: Diagnosis not present

## 2015-11-09 DIAGNOSIS — I10 Essential (primary) hypertension: Secondary | ICD-10-CM | POA: Diagnosis not present

## 2015-11-09 DIAGNOSIS — I2510 Atherosclerotic heart disease of native coronary artery without angina pectoris: Secondary | ICD-10-CM

## 2015-11-09 DIAGNOSIS — N182 Chronic kidney disease, stage 2 (mild): Secondary | ICD-10-CM | POA: Diagnosis not present

## 2015-11-09 DIAGNOSIS — Z1239 Encounter for other screening for malignant neoplasm of breast: Secondary | ICD-10-CM | POA: Diagnosis not present

## 2015-11-09 DIAGNOSIS — E559 Vitamin D deficiency, unspecified: Secondary | ICD-10-CM

## 2015-11-09 DIAGNOSIS — K219 Gastro-esophageal reflux disease without esophagitis: Secondary | ICD-10-CM | POA: Diagnosis not present

## 2015-11-09 DIAGNOSIS — Z5181 Encounter for therapeutic drug level monitoring: Secondary | ICD-10-CM

## 2015-11-09 DIAGNOSIS — M48061 Spinal stenosis, lumbar region without neurogenic claudication: Secondary | ICD-10-CM

## 2015-11-09 DIAGNOSIS — E785 Hyperlipidemia, unspecified: Secondary | ICD-10-CM | POA: Diagnosis not present

## 2015-11-09 DIAGNOSIS — G5793 Unspecified mononeuropathy of bilateral lower limbs: Secondary | ICD-10-CM | POA: Diagnosis not present

## 2015-11-09 LAB — COMPREHENSIVE METABOLIC PANEL WITH GFR
ALT: 35 U/L — ABNORMAL HIGH (ref 6–29)
AST: 22 U/L (ref 10–35)
Albumin: 4.1 g/dL (ref 3.6–5.1)
Alkaline Phosphatase: 78 U/L (ref 33–130)
BUN: 15 mg/dL (ref 7–25)
CO2: 29 mmol/L (ref 20–31)
Calcium: 9.4 mg/dL (ref 8.6–10.4)
Chloride: 103 mmol/L (ref 98–110)
Creat: 0.89 mg/dL (ref 0.60–0.93)
Glucose, Bld: 93 mg/dL (ref 65–99)
Potassium: 3.6 mmol/L (ref 3.5–5.3)
Sodium: 140 mmol/L (ref 135–146)
Total Bilirubin: 0.5 mg/dL (ref 0.2–1.2)
Total Protein: 6.9 g/dL (ref 6.1–8.1)

## 2015-11-09 LAB — LIPID PANEL
Cholesterol: 152 mg/dL (ref 125–200)
HDL: 56 mg/dL
LDL Cholesterol: 74 mg/dL
Total CHOL/HDL Ratio: 2.7 ratio
Triglycerides: 112 mg/dL
VLDL: 22 mg/dL

## 2015-11-09 MED ORDER — GABAPENTIN 100 MG PO CAPS: ORAL_CAPSULE | ORAL | 6 refills | Status: DC

## 2015-11-09 MED ORDER — PRAVASTATIN SODIUM 20 MG PO TABS: 10.0000 mg | ORAL_TABLET | Freq: Every day | ORAL | 1 refills | Status: DC

## 2015-11-09 NOTE — Assessment & Plan Note (Signed)
Check liver function 

## 2015-11-09 NOTE — Assessment & Plan Note (Signed)
Check lipids today; goal LDL is under 70; dietary changes urged

## 2015-11-09 NOTE — Patient Instructions (Addendum)
We'll try a low dose of another cholesterol medicine If you develop any itching or swelling, stop it immediately and notify us If you develop trouble breathing or feeling like your throat is closing up, then call 911 and take 50 mg of Benadryl immediately Try to limit saturated fats in your diet (bologna, hot dogs, barbeque, cheeseburgers, hamburgers, steak, bacon, sausage, cheese, etc.) and get more fresh fruits, vegetables, and whole grains We'll get labs today and notify you Please do call to schedule your mammogram; the number to schedule one at either [facility] or [facility] is [phone]   Cholesterol Cholesterol is a white, waxy, fat-like substance needed by your body in small amounts. The liver makes all the cholesterol you need. Cholesterol is carried from the liver by the blood through the blood vessels. Deposits of cholesterol (plaque) may build up on blood vessel walls. These make the arteries narrower and stiffer. Cholesterol plaques increase the risk for heart attack and stroke.  You cannot feel your cholesterol level even if it is very high. The only way to know it is high is with a blood test. Once you know your cholesterol levels, you should keep a record of the test results. Work with your health care provider to keep your levels in the desired range.  WHAT DO THE RESULTS MEAN?  Total cholesterol is a rough measure of all the cholesterol in your blood.   LDL is the so-called bad cholesterol. This is the type that deposits cholesterol in the walls of the arteries. You want this level to be low.   HDL is the good cholesterol because it cleans the arteries and carries the LDL away. You want this level to be high.  Triglycerides are fat that the body can either burn for energy or store. High levels are closely linked to heart disease.  WHAT ARE THE DESIRED LEVELS OF CHOLESTEROL?  Total cholesterol below 200.   LDL below 100 for people at  risk, below 70 for those at very high risk.   HDL above 50 is good, above 60 is best.   Triglycerides below 150.  HOW CAN I LOWER MY CHOLESTEROL?  Diet. Follow your diet programs as directed by your health care provider.   Choose fish or white meat chicken and [place], roasted or baked. Limit fatty cuts of red meat, fried foods, and processed meats, such as sausage and lunch meats.   Eat lots of fresh fruits and vegetables.  Choose whole grains, beans, pasta, potatoes, and cereals.   Use only small amounts of olive, corn, or canola oils.   Avoid butter, mayonnaise, shortening, or palm kernel oils.  Avoid foods with trans fats.   Drink skim or nonfat milk and eat low-fat or nonfat yogurt and cheeses. Avoid whole milk, cream, ice cream, egg yolks, and full-fat cheeses.   Healthy desserts include angel food cake, ginger snaps, animal crackers, hard candy, popsicles, and low-fat or nonfat frozen yogurt. Avoid pastries, cakes, pies, and cookies.   Exercise. Follow your exercise programs as directed by your health care provider.   A regular program helps decrease LDL and raise HDL.   A regular program helps with weight control.   Do things that increase your activity level like gardening, walking, or taking the stairs. Ask your health care provider about how you can be more active in your daily life.   Medicine. Take medicine only as directed by your health care provider.   Medicine may be prescribed  by your health care provider to help lower cholesterol and decrease the risk for heart disease.   If you have several risk factors, you may need medicine even if your levels are normal.   This information is not intended to replace advice given to you by your health care provider. Make sure you discuss any questions you have with your health care provider.   Document Released: 12/20/2000 Document Revised: 04/17/2014 Document Reviewed: 01/08/2013 Elsevier Interactive  Patient Education [employer]

## 2015-11-09 NOTE — Assessment & Plan Note (Signed)
Previous saw back doctor, continues to have leg aching; continue exercises

## 2015-11-09 NOTE — Assessment & Plan Note (Signed)
Well-controlled pressure today 

## 2015-11-09 NOTE — Assessment & Plan Note (Signed)
Check creatinine and GFR today; avoid NSAIDs 

## 2015-11-09 NOTE — Assessment & Plan Note (Signed)
Symptoms controlled with gabapentin, will change up the instructions and dose per patient request, very reasonable; continue stretching and walking

## 2015-11-09 NOTE — Assessment & Plan Note (Signed)
Helped by ranitidine 75 mg daily; avoid triggers

## 2015-11-09 NOTE — Progress Notes (Signed)
BP 124/78   Pulse 79   Temp 97.9 F (36.6 C) (Oral)   Resp 14   Wt 207 lb (93.9 kg)   LMP  (LMP Unknown)   SpO2 93%   BMI 31.02 kg/m    Subjective:    Patient ID: Dana Peters, female    DOB: Apr 26, 1945, 70 y.o.   MRN: [id number]  HPI: Dana Peters is a 70 y.o. female  Chief Complaint  Patient presents with  . Follow-up   She is here for regular follow-up She has nerve issues in her legs and has been taking neurontin, 300 mg in the morning and 100 mg at night and that works well for her  Hypertension; husband has monitor at home and can check if needed, but not checking much; discussed salt  Coronary artery calcification noted, she saw Dr. Mariah Milling; he put her on atorvastatin 10 mg; about 3 weeks into taking it, she started having itching and swelling; stopped the medicine and symptoms went away; she never felt like her throat was going to close up; skin just swelled and was itching; she has been eating a lot of boiled eggs, but is willing to cut back; does love cheese; not much milk; not much bacon or sausage; she used to smoke for 30 years but quit; smoked about 1 ppd the whole time  Vitamin D was low in January at 22; she has been taking 2,000 iu daily Vitamin B12 was normal at 502  Acid reflux is controlled on H2 blocker  She would like order for mammogram; no symptoms, no lumps  Lab Results  Component Value Date   CHOL 152 11/09/2015   CHOL 177 05/07/2015   CHOL 194 10/14/2014   Lab Results  Component Value Date   HDL 56 11/09/2015   HDL 47 05/07/2015   HDL 48 10/14/2014   Lab Results  Component Value Date   LDLCALC 74 11/09/2015   LDLCALC 90 05/07/2015   LDLCALC 110 (H) 10/14/2014   Lab Results  Component Value Date   TRIG 112 11/09/2015   TRIG 199 (H) 05/07/2015   TRIG 180 (H) 10/14/2014   Lab Results  Component Value Date   CHOLHDL 2.7 11/09/2015   No results found for: LDLDIRECT   Depression screen [facility] 2/9 11/09/2015 05/24/2015  01/07/2015 10/14/2014  Decreased Interest 0 0 0 0  Down, Depressed, Hopeless 1 1 0 0  PHQ - 2 Score 1 1 0 0   Relevant past medical, surgical, family and social history reviewed Past Medical History:  Diagnosis Date  . Allergy   . CKD (chronic kidney disease) stage 2, GFR 60-89 ml/min   . Dyslipidemia   . GERD (gastroesophageal reflux disease)   . Hypertension   . Lumbar spinal stenosis   . Neuropathy (HCC)    both feet  . Personal history of tobacco use, presenting hazards to health 07/14/2015  . Reflux   . Sciatica   . Venous stasis   . Vitamin D deficiency disease    Past Surgical History:  Procedure Laterality Date  . ABDOMINAL HYSTERECTOMY     complete, due to heavy  . ECTOPIC PREGNANCY SURGERY     Family History  Problem Relation Age of Onset  . Cancer Mother     colon  . Hypertension Mother   . Alzheimer's disease Mother   . Cancer Father     prostate  . Cancer Brother     lung  . Stroke Brother   .  Cancer Brother     prostate and brain  . Cancer Brother     throat  . Diabetes Paternal Aunt   . Healthy Daughter    Social History  Substance Use Topics  . Smoking status: Former Smoker    Packs/day: 1.00    Years: 30.00    Types: Cigarettes    Quit date: 10/13/2005  . Smokeless tobacco: Former Neurosurgeon    Types: Snuff    Quit date: 10/09/2011  . Alcohol use No     Comment: Rare    Interim medical history since last visit reviewed. Allergies and medications reviewed  Review of Systems Per HPI unless specifically indicated above     Objective:    BP 124/78   Pulse 79   Temp 97.9 F (36.6 C) (Oral)   Resp 14   Wt 207 lb (93.9 kg)   LMP  (LMP Unknown)   SpO2 93%   BMI 31.02 kg/m   Wt Readings from Last 3 Encounters:  11/09/15 207 lb (93.9 kg)  09/09/15 200 lb 8 oz (90.9 kg)  07/14/15 193 lb (87.5 kg)    Physical Exam  Constitutional: She appears well-developed and well-nourished. No distress.  HENT:  Head: Normocephalic and atraumatic.  Eyes:  EOM are normal. No scleral icterus.  Neck: No thyromegaly present.  Cardiovascular: Normal rate, regular rhythm and normal heart sounds.   No murmur heard. Pulmonary/Chest: Effort normal and breath sounds normal. No respiratory distress. She has no wheezes.  Abdominal: Soft. Bowel sounds are normal. She exhibits no distension.  Musculoskeletal: Normal range of motion. She exhibits no edema.  Neurological: She is alert. She exhibits normal muscle tone.  Skin: Skin is warm and dry. She is not diaphoretic. No pallor.  Psychiatric: She has a normal mood and affect. Her behavior is normal. Judgment and thought content normal.      Assessment & Plan:   Problem List Items Addressed This Visit      Cardiovascular and Mediastinum   Hypertension (Chronic)    Well-controlled pressure today      Relevant Medications   pravastatin (PRAVACHOL) 20 MG tablet   Coronary artery calcification seen on CT scan - Primary    Sounds like mild reaction to atorvastatin, so we discussed and we'll try low dose pravastatin; stop immediately if any reaction and call 911 if serious allergy; have benadryl on-hand      Relevant Medications   pravastatin (PRAVACHOL) 20 MG tablet   Other Relevant Orders   Lipid panel (Completed)     Digestive   Acid reflux (Chronic)    Helped by ranitidine 75 mg daily; avoid triggers        Nervous and Auditory   Neuropathy of both feet (HCC)    Symptoms controlled with gabapentin, will change up the instructions and dose per patient request, very reasonable; continue stretching and walking      Relevant Medications   gabapentin (NEURONTIN) 100 MG capsule     Genitourinary   CKD (chronic kidney disease) stage 2, GFR 60-89 ml/min    Check creatinine and GFR today; avoid NSAIDs        Other   Vitamin D deficiency disease    Check level and supplement if needed      Relevant Orders   VITAMIN D 25 Hydroxy (Vit-D Deficiency, Fractures) (Completed)   Spinal  stenosis of lumbar region    Previous saw back doctor, continues to have leg aching; continue exercises  Medication monitoring encounter    Check liver function      Relevant Orders   Comprehensive Metabolic Panel (CMET) (Completed)   Dyslipidemia    Check lipids today; goal LDL is under 70; dietary changes urged      Relevant Medications   pravastatin (PRAVACHOL) 20 MG tablet   Other Relevant Orders   Lipid panel (Completed)   Breast cancer screening    Order mammo      Relevant Orders   MM DIGITAL SCREENING BILATERAL    Other Visit Diagnoses   None.      Follow up plan: No Follow-up on file.  An after-visit summary was printed and given to the patient at check-out.  Please see the patient instructions which may contain other information and recommendations beyond what is mentioned above in the assessment and plan.  Meds ordered this encounter  Medications  . loratadine (CLARITIN) 10 MG tablet    Sig: Take by mouth.  . DISCONTD: traMADol (ULTRAM) 50 MG tablet    Sig: Take by mouth.  . gabapentin (NEURONTIN) 100 MG capsule    Sig: Three pills by mouth in the morning and one pill at bedtime    Dispense:  120 capsule    Refill:  6    CANCEL the 300 mg strength  . pravastatin (PRAVACHOL) 20 MG tablet    Sig: Take 0.5 tablets (10 mg total) by mouth daily.    Dispense:  15 tablet    Refill:  1    Stop the atorvastatin    Orders Placed This Encounter  Procedures  . MM DIGITAL SCREENING BILATERAL  . Lipid panel  . Comprehensive Metabolic Panel (CMET)  . VITAMIN D 25 Hydroxy (Vit-D Deficiency, Fractures)

## 2015-11-09 NOTE — Assessment & Plan Note (Signed)
Order mammo 

## 2015-11-09 NOTE — Assessment & Plan Note (Signed)
Sounds like mild reaction to atorvastatin, so we discussed and we'll try low dose pravastatin; stop immediately if any reaction and call 911 if serious allergy; have benadryl on-hand

## 2015-11-09 NOTE — Assessment & Plan Note (Signed)
Check level and supplement if needed 

## 2015-11-10 ENCOUNTER — Other Ambulatory Visit: Payer: Self-pay

## 2015-11-10 DIAGNOSIS — R7401 Elevation of levels of liver transaminase levels: Secondary | ICD-10-CM

## 2015-11-10 DIAGNOSIS — R740 Nonspecific elevation of levels of transaminase and lactic acid dehydrogenase [LDH]: Principal | ICD-10-CM

## 2015-11-10 LAB — VITAMIN D 25 HYDROXY (VIT D DEFICIENCY, FRACTURES): Vit D, 25-Hydroxy: 44 ng/mL (ref 30–100)

## 2015-11-17 ENCOUNTER — Telehealth: Payer: Self-pay

## 2015-11-17 MED ORDER — EZETIMIBE 10 MG PO TABS: 10.0000 mg | ORAL_TABLET | Freq: Every day | ORAL | 5 refills | Status: DC

## 2015-11-17 NOTE — Telephone Encounter (Signed)
Pt called states the new chol med is making her have nausea and confussion.  She has stopped and has felt better.

## 2015-11-17 NOTE — Telephone Encounter (Signed)
Thank you Please add to adverse reactions I removed pravastatin from med list Let's have her start Zetia instead (it is NOT related to statins) Thank you

## 2015-11-17 NOTE — Telephone Encounter (Signed)
Pt.notified

## 2015-11-29 ENCOUNTER — Telehealth: Payer: Self-pay | Admitting: Family Medicine

## 2015-11-29 NOTE — Telephone Encounter (Signed)
Signed and in fax tray

## 2015-11-30 NOTE — Telephone Encounter (Signed)
Pt notified waiting for approval through ins

## 2015-12-07 ENCOUNTER — Other Ambulatory Visit: Payer: Self-pay | Admitting: Family Medicine

## 2015-12-07 MED ORDER — EZETIMIBE 10 MG PO TABS: 10.0000 mg | ORAL_TABLET | Freq: Every day | ORAL | 3 refills | Status: DC

## 2015-12-07 NOTE — Progress Notes (Signed)
Insurance company will not cover generic Zetia, but will cover brand name only Brand name Zetia prescription to be faxed

## 2016-01-17 ENCOUNTER — Ambulatory Visit
Admission: RE | Admit: 2016-01-17 | Discharge: 2016-01-17 | Disposition: A | Discharge status: Home / Self Care | Payer: Commercial Managed Care - HMO | Source: Ambulatory Visit | Attending: Family Medicine | Admitting: Family Medicine

## 2016-01-17 DIAGNOSIS — Z1231 Encounter for screening mammogram for malignant neoplasm of breast: Secondary | ICD-10-CM | POA: Diagnosis present

## 2016-02-14 ENCOUNTER — Ambulatory Visit (INDEPENDENT_AMBULATORY_CARE_PROVIDER_SITE_OTHER): Payer: Commercial Managed Care - HMO | Admitting: Family Medicine

## 2016-02-14 ENCOUNTER — Encounter: Payer: Self-pay | Admitting: Family Medicine

## 2016-02-14 DIAGNOSIS — I10 Essential (primary) hypertension: Secondary | ICD-10-CM | POA: Diagnosis not present

## 2016-02-14 DIAGNOSIS — R3911 Hesitancy of micturition: Secondary | ICD-10-CM | POA: Insufficient documentation

## 2016-02-14 DIAGNOSIS — N182 Chronic kidney disease, stage 2 (mild): Secondary | ICD-10-CM | POA: Diagnosis not present

## 2016-02-14 DIAGNOSIS — J301 Allergic rhinitis due to pollen: Secondary | ICD-10-CM

## 2016-02-14 DIAGNOSIS — G5793 Unspecified mononeuropathy of bilateral lower limbs: Secondary | ICD-10-CM | POA: Diagnosis not present

## 2016-02-14 DIAGNOSIS — I2510 Atherosclerotic heart disease of native coronary artery without angina pectoris: Secondary | ICD-10-CM | POA: Diagnosis not present

## 2016-02-14 DIAGNOSIS — E559 Vitamin D deficiency, unspecified: Secondary | ICD-10-CM | POA: Diagnosis not present

## 2016-02-14 DIAGNOSIS — R740 Nonspecific elevation of levels of transaminase and lactic acid dehydrogenase [LDH]: Secondary | ICD-10-CM

## 2016-02-14 DIAGNOSIS — E785 Hyperlipidemia, unspecified: Secondary | ICD-10-CM

## 2016-02-14 DIAGNOSIS — R7401 Elevation of levels of liver transaminase levels: Secondary | ICD-10-CM

## 2016-02-14 LAB — BASIC METABOLIC PANEL WITHOUT GFR
BUN: 13 mg/dL (ref 7–25)
CO2: 30 mmol/L (ref 20–31)
Calcium: 9.8 mg/dL (ref 8.6–10.4)
Chloride: 101 mmol/L (ref 98–110)
Creat: 0.92 mg/dL (ref 0.60–0.93)
GFR, Est African American: 72 mL/min
GFR, Est Non African American: 63 mL/min
Glucose, Bld: 71 mg/dL (ref 65–99)
Potassium: 4.2 mmol/L (ref 3.5–5.3)
Sodium: 141 mmol/L (ref 135–146)

## 2016-02-14 LAB — ALT: ALT: 24 U/L (ref 6–29)

## 2016-02-14 LAB — AST: AST: 18 U/L (ref 10–35)

## 2016-02-14 MED ORDER — GABAPENTIN 100 MG PO CAPS: ORAL_CAPSULE | ORAL | 6 refills | Status: DC

## 2016-02-14 NOTE — Assessment & Plan Note (Signed)
Reviewed last lipids; very good numbers; keep trying to limit saturated fats; continue zetia, did not tolerate statins

## 2016-02-14 NOTE — Assessment & Plan Note (Signed)
Check urine today 

## 2016-02-14 NOTE — Assessment & Plan Note (Signed)
Referred to cardiology; continue aspirin; intolerant of statins; continue Zetia

## 2016-02-14 NOTE — Assessment & Plan Note (Signed)
Avoid decongestants 

## 2016-02-14 NOTE — Assessment & Plan Note (Signed)
Check creatinine today and avoid NSAIDs

## 2016-02-14 NOTE — Assessment & Plan Note (Signed)
Increase gabapentin  

## 2016-02-14 NOTE — Assessment & Plan Note (Signed)
Excellent control today; suggested we stop the HCTZ, have her monitor her BP and contact me if going up; try to work on weight loss and follow DASH guidelines

## 2016-02-14 NOTE — Patient Instructions (Addendum)
Increase your walking from 3 days a week to 4 days a week for one month, then go up to 5 days a week Drink 64-72 ounces of water every day Add the early afternoon gabapentin to your regimen for the neuropathy Stop the HCTZ and just control your blood pressure with DASH guidelines and weight loss Monitor your blood pressure a few times a week over the next month and call me if not controlled Your goal blood pressure is less than 150 mmHg on top. Try to follow the DASH guidelines (DASH stands for Dietary Approaches to Stop Hypertension) Try to limit the sodium in your diet.  Ideally, consume less than 1.5 grams (less than 1,500mg ) per day. Do not add salt when cooking or at the table.  Check the sodium amount on labels when shopping, and choose items lower in sodium when given a choice. Avoid or limit foods that already contain a lot of sodium. Eat a diet rich in fruits and vegetables and whole grains. Check out the information at familydoctor.org entitled "Nutrition for Weight Loss: What You Need to Know about Fad Diets" Try to lose between 1-2 pounds per week by taking in fewer calories and burning off more calories You can succeed by limiting portions, limiting foods dense in calories and fat, becoming more active, and drinking 8 glasses of water a day (64 ounces) Don't skip meals, especially breakfast, as skipping meals may alter your metabolism Do not use over-the-counter weight loss pills or gimmicks that claim rapid weight loss A healthy BMI (or body mass index) is between 18.5 and 24.9 You can calculate your ideal BMI at the NIH website [employer]huhttp://www.nhlbi.nih.gov/health/educational/lose_wt/BMI/bmicalc.htm

## 2016-02-14 NOTE — Assessment & Plan Note (Signed)
Back up to normal; continue 2,000 iu daily

## 2016-02-14 NOTE — Progress Notes (Signed)
BP 100/68   Pulse 77   Temp 98.4 F (36.9 C) (Oral)   Resp 16   Wt 207 lb (93.9 kg)   LMP  (LMP Unknown)   SpO2 96%   BMI 31.02 kg/m    Subjective:    Patient ID: Dana Peters, female    DOB: 1945-07-06, 70 y.o.   MRN: [id number]  HPI: Dana Peters is a 70 y.o. female  Chief Complaint  Patient presents with  . Follow-up   Since last visit, all has been well; no visits to other providers, no trips to ER, etc  She has a list of things 1.  Mammogram on October 9th IMPRESSION: No mammographic evidence of malignancy. A result letter of this screening mammogram will be mailed directly to the patient.  RECOMMENDATION: Screening mammogram in one year. (Code:SM-B-01Y)  BI-RADS CATEGORY  1: Negative.  Electronically Signed   By: Harmon PierJeffrey  Hu M.D.   On: 01/17/2016 11:14  2.  She wanted to check on her weight; her weight is stuck; she tries to lose weight, walks some, at least 3 days a week; drinks plenty of water; she does a good job with portion control at home, harder when eating out; she does not eat heavy foods, just popcorn and light snacks in the evening; she keeps ice cream out of her house; she drinks diet green tea; she does not drink as much coffee as before, [place]ruvia for sweetener  3.  She wanted to have her kidneys checked; no burning; a little hesitation with urination just sometimes; she is trying to drink enough water; sometimes she doesn't though; no strong smell; no blood in the urine  4.  She wanted to be checked for diabetes; runs in the family; strong on her daddy's side, aunts had it; no dry mouth , little blurred vision; no horrible sweet cravings  5. Any more advanced medicine besides gabapentin; she has neuropathy in the legs and feet; she has plenty of the pills; she likes to take 3 in the am and just 1 at night; taking 2 at night caused some sleep problems  6. Mildly elevated LFT in August; due for recheck  7. High cholesterol; we reviewed  her last TG and LDL readings over the last year; LDL down from 110 to 74; trying to eat oatmeal instead of sausage and bacon  8. Hx of vitamin D deficiency; came up from 22 to 8044 and still taking 2,000 iu daily  [facility] 02/14/2016 11/09/2015 05/24/2015 01/07/2015 10/14/2014  Decreased Interest 0 0 0 0 0  Down, Depressed, Hopeless 0 1 1 0 0  PHQ - 2 Score 0 1 1 0 0   Relevant past medical, surgical, family and social history reviewed Past Medical History:  Diagnosis Date  . Allergy   . CKD (chronic kidney disease) stage 2, GFR 60-89 ml/min   . Dyslipidemia   . GERD (gastroesophageal reflux disease)   . Hypertension   . Lumbar spinal stenosis   . Neuropathy (HCC)    both feet  . Personal history of tobacco use, presenting hazards to health 07/14/2015  . Reflux   . Sciatica   . Venous stasis   . Vitamin D deficiency disease    Past Surgical History:  Procedure Laterality Date  . ABDOMINAL HYSTERECTOMY     complete, due to heavy  . ECTOPIC PREGNANCY SURGERY     Family History  Problem Relation Age of Onset  . Cancer Mother  colon  . Hypertension Mother   . Alzheimer's disease Mother   . Cancer Father     prostate  . Cancer Brother     lung  . Stroke Brother   . Cancer Brother     prostate and brain  . Cancer Brother     throat  . Diabetes Paternal Aunt   . Breast cancer Paternal Aunt   . Healthy Daughter    Social History  Substance Use Topics  . Smoking status: Former Smoker    Packs/day: 1.00    Years: 30.00    Types: Cigarettes    Quit date: 10/13/2005  . Smokeless tobacco: Former Neurosurgeon    Types: Snuff    Quit date: 10/09/2011  . Alcohol use No     Comment: Rare   Interim medical history since last visit reviewed. Allergies and medications reviewed  Review of Systems Per HPI unless specifically indicated above     Objective:    BP 100/68   Pulse 77   Temp 98.4 F (36.9 C) (Oral)   Resp 16   Wt 207 lb (93.9 kg)   LMP  (LMP Unknown)    SpO2 96%   BMI 31.02 kg/m   Wt Readings from Last 3 Encounters:  02/14/16 207 lb (93.9 kg)  11/09/15 207 lb (93.9 kg)  09/09/15 200 lb 8 oz (90.9 kg)   Physical Exam  Constitutional: She appears well-developed and well-nourished. No distress.  Obese, weight stable  HENT:  Head: Normocephalic and atraumatic.  Eyes: EOM are normal. No scleral icterus.  Neck: No thyromegaly present.  Cardiovascular: Normal rate, regular rhythm and normal heart sounds.   No murmur heard. Pulmonary/Chest: Effort normal and breath sounds normal. No respiratory distress. She has no wheezes.  Abdominal: Soft. Bowel sounds are normal. She exhibits no distension. There is no guarding.  Musculoskeletal: Normal range of motion. She exhibits no edema.  Neurological: She is alert. She exhibits normal muscle tone.  Skin: Skin is warm and dry. She is not diaphoretic. No pallor.  Psychiatric: She has a normal mood and affect. Her behavior is normal. Judgment and thought content normal. Her mood appears not anxious. She does not exhibit a depressed mood.   Results for orders placed or performed in visit on 11/09/15  Lipid panel  Result Value Ref Range   Cholesterol 152 125 - 200 mg/dL   Triglycerides 161 <096 mg/dL   HDL 56 >=04 mg/dL   Total CHOL/HDL Ratio 2.7 <=5.0 Ratio   VLDL 22 <30 mg/dL   LDL Cholesterol 74 <540 mg/dL  Comprehensive Metabolic Panel (CMET)  Result Value Ref Range   Sodium 140 135 - 146 mmol/L   Potassium 3.6 3.5 - 5.3 mmol/L   Chloride 103 98 - 110 mmol/L   CO2 29 20 - 31 mmol/L   Glucose, Bld 93 65 - 99 mg/dL   BUN 15 7 - 25 mg/dL   Creat 9.81 1.91 - 4.78 mg/dL   Total Bilirubin 0.5 0.2 - 1.2 mg/dL   Alkaline Phosphatase 78 33 - 130 U/L   AST 22 10 - 35 U/L   ALT 35 (H) 6 - 29 U/L   Total Protein 6.9 6.1 - 8.1 g/dL   Albumin 4.1 3.6 - 5.1 g/dL   Calcium 9.4 8.6 - 29.5 mg/dL  VITAMIN D 25 Hydroxy (Vit-D Deficiency, Fractures)  Result Value Ref Range   Vit D, 25-Hydroxy 44 30 -  100 ng/mL      Assessment & Plan:  Problem List Items Addressed This Visit      Cardiovascular and Mediastinum   Hypertension (Chronic)    Excellent control today; suggested we stop the HCTZ, have her monitor her BP and contact me if going up; try to work on weight loss and follow DASH guidelines      Coronary artery calcification seen on CT scan (Chronic)    Referred to cardiology; continue aspirin; intolerant of statins; continue Zetia        Respiratory   Allergic rhinitis (Chronic)    Avoid decongestants        Nervous and Auditory   Neuropathy of both feet (Chronic)    Increase gabapentin      Relevant Medications   gabapentin (NEURONTIN) 100 MG capsule     Genitourinary   CKD (chronic kidney disease) stage 2, GFR 60-89 ml/min (Chronic)    Check creatinine today and avoid NSAIDs      Relevant Orders   BASIC METABOLIC PANEL WITH GFR     Other   Vitamin D deficiency disease    Back up to normal; continue 2,000 iu daily      Urinary hesitancy    Check urine today      Relevant Orders   Urinalysis w microscopic + reflex cultur   Dyslipidemia (Chronic)    Reviewed last lipids; very good numbers; keep trying to limit saturated fats; continue zetia, did not tolerate statins       Other Visit Diagnoses    Elevated serum glutamic pyruvic transaminase (SGPT) level          Follow up plan: Return in about 6 months (around 08/13/2016) for follow-up; also get Medicare wellness visit when due.  An after-visit summary was printed and given to the patient at check-out.  Please see the patient instructions which may contain other information and recommendations beyond what is mentioned above in the assessment and plan.  Meds ordered this encounter  Medications  . gabapentin (NEURONTIN) 100 MG capsule    Sig: Three pills by mouth in the morning, one pill in the early afternoon, and one pill at bedtime    Dispense:  150 capsule    Refill:  6    Cancel the other  Rx    Orders Placed This Encounter  Procedures  . Urinalysis w microscopic + reflex cultur  . BASIC METABOLIC PANEL WITH GFR

## 2016-02-15 LAB — URINALYSIS W MICROSCOPIC + REFLEX CULTURE
Bacteria, UA: NONE SEEN [HPF]
Bilirubin Urine: NEGATIVE
Casts: NONE SEEN [LPF]
Crystals: NONE SEEN [HPF]
Glucose, UA: NEGATIVE
Hgb urine dipstick: NEGATIVE
Ketones, ur: NEGATIVE
Nitrite: NEGATIVE
Protein, ur: NEGATIVE
RBC / HPF: NONE SEEN RBC/HPF
Specific Gravity, Urine: 1.007 (ref 1.001–1.035)
Squamous Epithelial / HPF: NONE SEEN [HPF]
Yeast: NONE SEEN [HPF]
pH: 5 (ref 5.0–8.0)

## 2016-02-16 ENCOUNTER — Telehealth: Payer: Self-pay

## 2016-02-16 LAB — URINE CULTURE

## 2016-02-16 NOTE — Telephone Encounter (Signed)
Called pt regarding her ;ab results. Left pt a voicemail to give me call back.

## 2016-02-16 NOTE — Telephone Encounter (Signed)
-----   Message from Kerman PasseyMelinda P Lada, MD sent at 02/16/2016 10:02 AM EST ----- Please let pt know that her urine culture is still pending, but there was no blood in her urine; not sure if any infection, but nothing growing out yet; her kidneys are doing okay; liver tests are normal; thank you

## 2016-02-16 NOTE — Telephone Encounter (Signed)
Pt returning your call

## 2016-04-12 DIAGNOSIS — R69 Illness, unspecified: Secondary | ICD-10-CM | POA: Diagnosis not present

## 2016-04-28 DIAGNOSIS — H35039 Hypertensive retinopathy, unspecified eye: Secondary | ICD-10-CM | POA: Diagnosis not present

## 2016-04-28 DIAGNOSIS — H2513 Age-related nuclear cataract, bilateral: Secondary | ICD-10-CM | POA: Diagnosis not present

## 2016-05-23 DIAGNOSIS — R69 Illness, unspecified: Secondary | ICD-10-CM | POA: Diagnosis not present

## 2016-05-25 ENCOUNTER — Ambulatory Visit (INDEPENDENT_AMBULATORY_CARE_PROVIDER_SITE_OTHER): Payer: Medicare HMO | Admitting: Family Medicine

## 2016-05-25 ENCOUNTER — Encounter: Payer: Self-pay | Admitting: Family Medicine

## 2016-05-25 ENCOUNTER — Other Ambulatory Visit: Payer: Self-pay | Admitting: Family Medicine

## 2016-05-25 VITALS — BP 110/70 | HR 81 | Temp 98.7°F | Ht 68.5 in | Wt 205.2 lb

## 2016-05-25 DIAGNOSIS — G5793 Unspecified mononeuropathy of bilateral lower limbs: Secondary | ICD-10-CM

## 2016-05-25 DIAGNOSIS — N182 Chronic kidney disease, stage 2 (mild): Secondary | ICD-10-CM

## 2016-05-25 DIAGNOSIS — I10 Essential (primary) hypertension: Secondary | ICD-10-CM

## 2016-05-25 DIAGNOSIS — E669 Obesity, unspecified: Secondary | ICD-10-CM | POA: Diagnosis not present

## 2016-05-25 DIAGNOSIS — L659 Nonscarring hair loss, unspecified: Secondary | ICD-10-CM | POA: Diagnosis not present

## 2016-05-25 DIAGNOSIS — E66811 Obesity, class 1: Secondary | ICD-10-CM

## 2016-05-25 MED ORDER — DULOXETINE HCL 30 MG PO CPEP: 30.0000 mg | ORAL_CAPSULE | Freq: Every day | ORAL | 0 refills | Status: DC

## 2016-05-25 NOTE — Assessment & Plan Note (Signed)
Reviewed last creatinine; continue to avoid NSAIDs, stay hydrated; check kidney function again in May 2018

## 2016-05-25 NOTE — Patient Instructions (Addendum)
Take just 200 mg of gabapentin daily for 3 days, then 100 mg of gabapentin daily for 3 days, then stop Start the new medicine called duloxetine Be very careful about any stimulant in the tea for weight loss, and avoid anything that could harm your heart or kidneys or liver or cause a stroke Start taking biotin to help with hair and nails

## 2016-05-25 NOTE — Assessment & Plan Note (Signed)
Patient wants to stop gabapentin; will start duloxetine

## 2016-05-25 NOTE — Assessment & Plan Note (Signed)
Excellent control today; praise given for weight loss and try to follow the DASH guidelines

## 2016-05-25 NOTE — Progress Notes (Signed)
BP 110/70   Pulse 81   Temp 98.7 F (37.1 C) (Oral)   Ht 5' 8.5" (1.74 m)   Wt 205 lb 3.2 oz (93.1 kg)   LMP  (LMP Unknown)   SpO2 95%   BMI 30.74 kg/m    Subjective:    Patient ID: Dana Peters, female    DOB: [birth year], 71 y.o.   MRN: [id number]  HPI: Dana Griffinslizabeth A Hallums is a 71 y.o. female  Chief Complaint  Patient presents with  . Annual Exam    medicare wellness   She wanted to talk about several things so we'll do her Medicare wellness visit another day; she has seen an article about how bad gabapentin is; I asked in particular what things; she says it could damage your kidneys, several things; she has neuropathy in the feet; she is uncomfortable taking the medicine; she has spinal stenosis She is out of her gabapentin anyway, just has 3-4 days in her tray, taking 300 mg every morning  She stopped taking the HTN pills; her BP is 110/70; controlled today She has lost two pounds; will try to lose ten pounds; drinking skinny tea and lemon juice She'll stay off of HTN medicines  She would like to see about her kidneys; making urine okay; getting better Urine showed no protein in November; last BUN 13, last creatinine 0.92 in November  Hair is thinning; fingernails quit growing; hair is just thin all over; no breakage  [facility] 05/25/2016 02/14/2016 11/09/2015 05/24/2015 01/07/2015  Decreased Interest 0 0 0 0 0  Down, Depressed, Hopeless 1 0 1 1 0  PHQ - 2 Score 1 0 1 1 0   Relevant past medical, surgical, family and social history reviewed Past Medical History:  Diagnosis Date  . Allergy   . CKD (chronic kidney disease) stage 2, GFR 60-89 ml/min   . Dyslipidemia   . GERD (gastroesophageal reflux disease)   . Hypertension   . Lumbar spinal stenosis   . Neuropathy (HCC)    both feet  . Obesity (BMI 30.0-34.9) 05/29/2016  . Personal history of tobacco use, presenting hazards to health 07/14/2015  . Reflux   . Sciatica   . Venous stasis   .  Vitamin D deficiency disease    Past Surgical History:  Procedure Laterality Date  . ABDOMINAL HYSTERECTOMY     complete, due to heavy  . ECTOPIC PREGNANCY SURGERY     Family History  Problem Relation Age of Onset  . Cancer Mother     colon  . Hypertension Mother   . Alzheimer's disease Mother   . Cancer Father     prostate  . Cancer Brother     lung  . Stroke Brother   . Cancer Brother     prostate and brain  . Cancer Brother     throat  . Diabetes Paternal Aunt   . Breast cancer Paternal Aunt   . Healthy Daughter   . Cirrhosis Brother    Social History  Substance Use Topics  . Smoking status: Former Smoker    Packs/day: 1.00    Years: 30.00    Types: Cigarettes    Quit date: 10/13/2005  . Smokeless tobacco: Former NeurosurgeonUser    Types: Snuff    Quit date: 10/09/2011  . Alcohol use No     Comment: Rare    Interim medical history since last visit reviewed. Allergies and medications reviewed  Review of Systems Per HPI unless  specifically indicated above     Objective:    BP 110/70   Pulse 81   Temp 98.7 F (37.1 C) (Oral)   Ht 5' 8.5" (1.74 m)   Wt 205 lb 3.2 oz (93.1 kg)   LMP  (LMP Unknown)   SpO2 95%   BMI 30.74 kg/m   Wt Readings from Last 3 Encounters:  05/25/16 205 lb 3.2 oz (93.1 kg)  02/14/16 207 lb (93.9 kg)  11/09/15 207 lb (93.9 kg)    Physical Exam  Constitutional: She appears well-developed and well-nourished.  HENT:  Mouth/Throat: Mucous membranes are normal.  Eyes: EOM are normal. No scleral icterus.  Cardiovascular: Normal rate and regular rhythm.   Pulmonary/Chest: Effort normal and breath sounds normal.  Skin:  Diffuse thinning of hair on scalp; scalp itself looks healthy without erythema or scale; no breakage of hair seen  Psychiatric: She has a normal mood and affect. Her behavior is normal. Her mood appears not anxious. She does not exhibit a depressed mood.      Assessment & Plan:   Problem List Items Addressed This Visit        Cardiovascular and Mediastinum   Hypertension - Primary (Chronic)    Excellent control today; praise given for weight loss and try to follow the DASH guidelines        Nervous and Auditory   Neuropathy of both feet (Chronic)    Patient wants to stop gabapentin; will start duloxetine      Relevant Medications   DULoxetine (CYMBALTA) 30 MG capsule     Genitourinary   CKD (chronic kidney disease) stage 2, GFR 60-89 ml/min (Chronic)    Reviewed last creatinine; continue to avoid NSAIDs, stay hydrated; check kidney function again in May 2018        Other   Obesity (BMI 30.0-34.9)    Encouraged safe healthy weight loss; avoid stimulants       Other Visit Diagnoses    Alopecia       try biotin; may be telogen effluvium; can check thyroid function and/or refer to derm if persistent       Follow up plan: Return for Medicare wellness visit, next several weeks.  An after-visit summary was printed and given to the patient at check-out.  Please see the patient instructions which may contain other information and recommendations beyond what is mentioned above in the assessment and plan.  Meds ordered this encounter  Medications  . DULoxetine (CYMBALTA) 30 MG capsule    Sig: Take 1 capsule (30 mg total) by mouth daily.    Dispense:  30 capsule    Refill:  0    For neuropathy; stopping gabapentin    No orders of the defined types were placed in this encounter.

## 2016-05-29 ENCOUNTER — Encounter: Payer: Self-pay | Admitting: Family Medicine

## 2016-05-29 DIAGNOSIS — E66811 Obesity, class 1: Secondary | ICD-10-CM | POA: Insufficient documentation

## 2016-05-29 DIAGNOSIS — E669 Obesity, unspecified: Secondary | ICD-10-CM

## 2016-05-29 HISTORY — DX: Obesity, class 1: E66.811

## 2016-05-29 HISTORY — DX: Obesity, unspecified: E66.9

## 2016-05-29 NOTE — Assessment & Plan Note (Signed)
Encouraged safe healthy weight loss; avoid stimulants

## 2016-05-30 DIAGNOSIS — R69 Illness, unspecified: Secondary | ICD-10-CM | POA: Diagnosis not present

## 2016-06-15 ENCOUNTER — Ambulatory Visit (INDEPENDENT_AMBULATORY_CARE_PROVIDER_SITE_OTHER): Payer: Medicare HMO | Admitting: Family Medicine

## 2016-06-15 ENCOUNTER — Encounter: Payer: Self-pay | Admitting: Family Medicine

## 2016-06-15 ENCOUNTER — Telehealth: Payer: Self-pay | Admitting: Family Medicine

## 2016-06-15 VITALS — BP 128/82 | Temp 98.4°F | Ht 69.0 in | Wt 203.9 lb

## 2016-06-15 DIAGNOSIS — Z1211 Encounter for screening for malignant neoplasm of colon: Secondary | ICD-10-CM | POA: Diagnosis not present

## 2016-06-15 DIAGNOSIS — Z87891 Personal history of nicotine dependence: Secondary | ICD-10-CM | POA: Diagnosis not present

## 2016-06-15 DIAGNOSIS — Z0000 Encounter for general adult medical examination without abnormal findings: Secondary | ICD-10-CM | POA: Diagnosis not present

## 2016-06-15 DIAGNOSIS — M8589 Other specified disorders of bone density and structure, multiple sites: Secondary | ICD-10-CM | POA: Diagnosis not present

## 2016-06-15 DIAGNOSIS — M8580 Other specified disorders of bone density and structure, unspecified site: Secondary | ICD-10-CM

## 2016-06-15 HISTORY — DX: Other specified disorders of bone density and structure, unspecified site: M85.80

## 2016-06-15 MED ORDER — EZETIMIBE 10 MG PO TABS: 10.0000 mg | ORAL_TABLET | Freq: Every day | ORAL | 3 refills | Status: DC

## 2016-06-15 NOTE — Assessment & Plan Note (Signed)
Order chest CT; due in April

## 2016-06-15 NOTE — Assessment & Plan Note (Signed)
Order DEXA, fall precautions; vit D and calcium

## 2016-06-15 NOTE — Telephone Encounter (Signed)
Rx sent and it was confirmed by fax confirmation

## 2016-06-15 NOTE — Patient Instructions (Addendum)
Take vitamin D3 and your dose can be 1,000 to 2,000 iu daily on the days when you don't get much sun  Health Maintenance  Topic Date Due  . MAMMOGRAM  01/16/2018  . COLONOSCOPY  07/10/2019  . TETANUS/TDAP  01/11/2024  . INFLUENZA VACCINE  Completed  . DEXA SCAN  Addressed  . Hepatitis C Screening  Completed  . PNA vac Low Risk Adult  Completed   Mammogram is recommended on or after January 16, 2017 (not 2019) Please do call to schedule your bone density study through [facility] or facility of your choice; that will be due next week or shortly thereafter [phone] Please do get a flu shot every season, and the next one will come out in August/September 2018 Consider the new shingles vaccine called Shingrix, which is recommended even if you had the other type of shingles vaccine  Health Maintenance, Female Adopting a healthy lifestyle and getting preventive care can go a long way to promote health and wellness. Talk with your health care provider about what schedule of regular examinations is right for you. This is a good chance for you to check in with your provider about disease prevention and staying healthy. In between checkups, there are plenty of things you can do on your own. Experts have done a lot of research about which lifestyle changes and preventive measures are most likely to keep you healthy. Ask your health care provider for more information. Weight and diet Eat a healthy diet  Be sure to include plenty of vegetables, fruits, low-fat dairy products, and lean protein.  Do not eat a lot of foods high in solid fats, added sugars, or salt.  Get regular exercise. This is one of the most important things you can do for your health.  Most adults should exercise for at least 150 minutes each week. The exercise should increase your heart rate and make you sweat (moderate-intensity exercise).  Most adults should also do strengthening exercises at least twice a week. This is in  addition to the moderate-intensity exercise. Maintain a healthy weight  Body mass index (BMI) is a measurement that can be used to identify possible weight problems. It estimates body fat based on height and weight. Your health care provider can help determine your BMI and help you achieve or maintain a healthy weight.  For females 67 years of age and older:  A BMI below 18.5 is considered underweight.  A BMI of 18.5 to 24.9 is normal.  A BMI of 25 to 29.9 is considered overweight.  A BMI of 30 and above is considered obese. Watch levels of cholesterol and blood lipids  You should start having your blood tested for lipids and cholesterol at 72 years of age, then have this test every 5 years.  You may need to have your cholesterol levels checked more often if:  Your lipid or cholesterol levels are high.  You are older than 72 years of age.  You are at high risk for heart disease. Cancer screening Lung Cancer  Lung cancer screening is recommended for adults 5-39 years old who are at high risk for lung cancer because of a history of smoking.  A yearly low-dose CT scan of the lungs is recommended for people who:  Currently smoke.  Have quit within the past 15 years.  Have at least a 30-pack-year history of smoking. A pack year is smoking an average of one pack of cigarettes a day for 1 year.  Yearly screening  should continue until it has been 15 years since you quit.  Yearly screening should stop if you develop a health problem that would prevent you from having lung cancer treatment. Breast Cancer  Practice breast self-awareness. This means understanding how your breasts normally appear and feel.  It also means doing regular breast self-exams. Let your health care provider know about any changes, no matter how small.  If you are in your 20s or 30s, you should have a clinical breast exam (CBE) by a health care provider every 1-3 years as part of a regular health  exam.  If you are 30 or older, have a CBE every year. Also consider having a breast X-ray (mammogram) every year.  If you have a family history of breast cancer, talk to your health care provider about genetic screening.  If you are at high risk for breast cancer, talk to your health care provider about having an MRI and a mammogram every year.  Breast cancer gene (BRCA) assessment is recommended for women who have family members with BRCA-related cancers. BRCA-related cancers include:  Breast.  Ovarian.  Tubal.  Peritoneal cancers.  Results of the assessment will determine the need for genetic counseling and BRCA1 and BRCA2 testing. Cervical Cancer  Your health care provider may recommend that you be screened regularly for cancer of the pelvic organs (ovaries, uterus, and vagina). This screening involves a pelvic examination, including checking for microscopic changes to the surface of your cervix (Pap test). You may be encouraged to have this screening done every 3 years, beginning at age 26.  For women ages 59-65, health care providers may recommend pelvic exams and Pap testing every 3 years, or they may recommend the Pap and pelvic exam, combined with testing for human papilloma virus (HPV), every 5 years. Some types of HPV increase your risk of cervical cancer. Testing for HPV may also be done on women of any age with unclear Pap test results.  Other health care providers may not recommend any screening for nonpregnant women who are considered low risk for pelvic cancer and who do not have symptoms. Ask your health care provider if a screening pelvic exam is right for you.  If you have had past treatment for cervical cancer or a condition that could lead to cancer, you need Pap tests and screening for cancer for at least 20 years after your treatment. If Pap tests have been discontinued, your risk factors (such as having a new sexual partner) need to be reassessed to determine if  screening should resume. Some women have medical problems that increase the chance of getting cervical cancer. In these cases, your health care provider may recommend more frequent screening and Pap tests. Colorectal Cancer  This type of cancer can be detected and often prevented.  Routine colorectal cancer screening usually begins at 71 years of age and continues through 72 years of age.  Your health care provider may recommend screening at an earlier age if you have risk factors for colon cancer.  Your health care provider may also recommend using home test kits to check for hidden blood in the stool.  A small camera at the end of a tube can be used to examine your colon directly (sigmoidoscopy or colonoscopy). This is done to check for the earliest forms of colorectal cancer.  Routine screening usually begins at age 72.  Direct examination of the colon should be repeated every 5-10 years through 72 years of age. However, you may need  to be screened more often if early forms of precancerous polyps or small growths are found. Skin Cancer  Check your skin from head to toe regularly.  Tell your health care provider about any new moles or changes in moles, especially if there is a change in a mole's shape or color.  Also tell your health care provider if you have a mole that is larger than the size of a pencil eraser.  Always use sunscreen. Apply sunscreen liberally and repeatedly throughout the day.  Protect yourself by wearing long sleeves, pants, a wide-brimmed hat, and sunglasses whenever you are outside. Heart disease, diabetes, and high blood pressure  High blood pressure causes heart disease and increases the risk of stroke. High blood pressure is more likely to develop in:  People who have blood pressure in the high end of the normal range (130-139/85-89 mm Hg).  People who are overweight or obese.  People who are African American.  If you are 34-31 years of age, have your  blood pressure checked every 3-5 years. If you are 27 years of age or older, have your blood pressure checked every year. You should have your blood pressure measured twice-once when you are at a hospital or clinic, and once when you are not at a hospital or clinic. Record the average of the two measurements. To check your blood pressure when you are not at a hospital or clinic, you can use:  An automated blood pressure machine at a pharmacy.  A home blood pressure monitor.  If you are between 70 years and 64 years old, ask your health care provider if you should take aspirin to prevent strokes.  Have regular diabetes screenings. This involves taking a blood sample to check your fasting blood sugar level.  If you are at a normal weight and have a low risk for diabetes, have this test once every three years after 72 years of age.  If you are overweight and have a high risk for diabetes, consider being tested at a younger age or more often. Preventing infection Hepatitis B  If you have a higher risk for hepatitis B, you should be screened for this virus. You are considered at high risk for hepatitis B if:  You were born in a country where hepatitis B is common. Ask your health care provider which countries are considered high risk.  Your parents were born in a high-risk country, and you have not been immunized against hepatitis B (hepatitis B vaccine).  You have HIV or AIDS.  You use needles to inject street drugs.  You live with someone who has hepatitis B.  You have had sex with someone who has hepatitis B.  You get hemodialysis treatment.  You take certain medicines for conditions, including cancer, organ transplantation, and autoimmune conditions. Hepatitis C  Blood testing is recommended for:  Everyone born from [birth year] through 1966.  Anyone with known risk factors for hepatitis C. Sexually transmitted infections (STIs)  You should be screened for sexually transmitted  infections (STIs) including gonorrhea and chlamydia if:  You are sexually active and are younger than 72 years of age.  You are older than 72 years of age and your health care provider tells you that you are at risk for this type of infection.  Your sexual activity has changed since you were last screened and you are at an increased risk for chlamydia or gonorrhea. Ask your health care provider if you are at risk.  If you do  not have HIV, but are at risk, it may be recommended that you take a prescription medicine daily to prevent HIV infection. This is called pre-exposure prophylaxis (PrEP). You are considered at risk if:  You are sexually active and do not regularly use condoms or know the HIV status of your partner(s).  You take drugs by injection.  You are sexually active with a partner who has HIV. Talk with your health care provider about whether you are at high risk of being infected with HIV. If you choose to begin PrEP, you should first be tested for HIV. You should then be tested every 3 months for as long as you are taking PrEP. Pregnancy  If you are premenopausal and you may become pregnant, ask your health care provider about preconception counseling.  If you may become pregnant, take 400 to 800 micrograms (mcg) of folic acid every day.  If you want to prevent pregnancy, talk to your health care provider about birth control (contraception). Osteoporosis and menopause  Osteoporosis is a disease in which the bones lose minerals and strength with aging. This can result in serious bone fractures. Your risk for osteoporosis can be identified using a bone density scan.  If you are 39 years of age or older, or if you are at risk for osteoporosis and fractures, ask your health care provider if you should be screened.  Ask your health care provider whether you should take a calcium or vitamin D supplement to lower your risk for osteoporosis.  Menopause may have certain physical  symptoms and risks.  Hormone replacement therapy may reduce some of these symptoms and risks. Talk to your health care provider about whether hormone replacement therapy is right for you. Follow these instructions at home:  Schedule regular health, dental, and eye exams.  Stay current with your immunizations.  Do not use any tobacco products including cigarettes, chewing tobacco, or electronic cigarettes.  If you are pregnant, do not drink alcohol.  If you are breastfeeding, limit how much and how often you drink alcohol.  Limit alcohol intake to no more than 1 drink per day for nonpregnant women. One drink equals 12 ounces of beer, 5 ounces of wine, or 1 ounces of hard liquor.  Do not use street drugs.  Do not share needles.  Ask your health care provider for help if you need support or information about quitting drugs.  Tell your health care provider if you often feel depressed.  Tell your health care provider if you have ever been abused or do not feel safe at home. This information is not intended to replace advice given to you by your health care provider. Make sure you discuss any questions you have with your health care provider. Document Released: 10/10/2010 Document Revised: 09/02/2015 Document Reviewed: 12/29/2014 Elsevier Interactive Patient Education  2017 [employer].

## 2016-06-15 NOTE — Assessment & Plan Note (Signed)
USPSTF grade A and B recommendations reviewed with patient; age-appropriate recommendations, preventive care, screening tests, etc discussed and encouraged; healthy living encouraged; see AVS for patient education given to patient  

## 2016-06-15 NOTE — Progress Notes (Signed)
Patient: Dana Peters, Female    DOB: 1945-04-21, 71 y.o.   MRN: [id number]  Visit Date: 06/15/2016  Today's Provider: Baruch Gouty, MD   Chief Complaint  Patient presents with  . Annual Exam    medicare wellness    Subjective:   Dana Peters is a 71 y.o. female who presents today for her Subsequent Annual Wellness Visit.  Caregiver input:  n/a  USPSTF grade A and B recommendations Alcohol: social Depression:  Depression screen [facility] 2/9 06/15/2016 05/25/2016 02/14/2016 11/09/2015 05/24/2015  Decreased Interest 0 0 0 0 0  Down, Depressed, Hopeless 0 1 0 1 1  PHQ - 2 Score 0 1 0 1 1    Hypertension: controlled Obesity:  Tobacco use: smoked about 30 about 1 ppd HIV, hep B, hep C: UTD STD testing and prevention (chl/gon/syphilis): declined Lipids: UTD Glucose: 71 in November Colorectal cancer: next due 2021 Breast cancer: UTD; Oct 2017; no lumps Intimate partner violence: no abuse Cervical cancer screening: s/p hyst --------------------------------- Lung cancer: last screen CT was July 14, 2015; next due July 14, 2016 or just after IMPRESSION: 1. Lung-RADS Category 2, benign appearance or behavior. Continue annual screening with low-dose chest CT without contrast in 12 months. 2. Borderline enlarged pulmonary arteries. 3. Coronary artery calcification.  Electronically Signed   By: Leanna Battles M.D.   On: 07/14/2015 13:47 ----------------------------------- Osteoporosis: due for DEXA Fall prevention/vitamin D: discussed AAA: not in the age range Aspirin: taking aspirin daily Diet: eating good healthy Exercise: tries to walk every day Skin cancer: no worrisome moles  HPI  Review of Systems  Past Medical History:  Diagnosis Date  . Allergy   . CKD (chronic kidney disease) stage 2, GFR 60-89 ml/min   . Dyslipidemia   . GERD (gastroesophageal reflux disease)   . Hypertension   . Lumbar spinal stenosis   . Neuropathy (HCC)    both feet  . Obesity  (BMI 30.0-34.9) 05/29/2016  . Personal history of tobacco use, presenting hazards to health 07/14/2015  . Reflux   . Sciatica   . Venous stasis   . Vitamin D deficiency disease     Past Surgical History:  Procedure Laterality Date  . ABDOMINAL HYSTERECTOMY     complete, due to heavy  . ECTOPIC PREGNANCY SURGERY      Family History  Problem Relation Age of Onset  . Cancer Mother     colon  . Hypertension Mother   . Alzheimer's disease Mother   . Cancer Father     prostate  . Cancer Brother     lung  . Stroke Brother   . Cancer Brother     prostate and brain  . Cancer Brother     throat  . Diabetes Paternal Aunt   . Breast cancer Paternal Aunt   . Healthy Daughter   . Cirrhosis Brother     Social History   Social History  . Marital status: Married    Spouse name: N/A  . Number of children: N/A  . Years of education: N/A   Occupational History  . Not on file.   Social History Main Topics  . Smoking status: Former Smoker    Packs/day: 1.00    Years: 30.00    Types: Cigarettes    Quit date: 10/13/2005  . Smokeless tobacco: Former Neurosurgeon    Types: Snuff    Quit date: 10/09/2011  . Alcohol use No     Comment: Rare  . Drug use:  No  . Sexual activity: Not on file   Other Topics Concern  . Not on file   Social History Narrative   Lives with husband in a one story home.  Has one daughter.     Retired from [employer] work.     Education: high school.    Outpatient Encounter Prescriptions as of 06/15/2016  Medication Sig Note  . aspirin EC 81 MG tablet Take 1 tablet (81 mg total) by mouth daily.   . Cholecalciferol (VITAMIN D) 2000 units CAPS Take by mouth daily.   . DULoxetine (CYMBALTA) 30 MG capsule Take 1 capsule (30 mg total) by mouth daily.   Marland Kitchen ezetimibe (ZETIA) 10 MG tablet Take 1 tablet (10 mg total) by mouth daily. (Patient not taking: Reported on 06/15/2016) 06/15/2016: States needs new rx has ran out  . loratadine (CLARITIN) 10 MG tablet Take by mouth daily as  needed.  11/09/2015: Received from: [facility] Received Sig: Take 1 tablet (10 mg total) by mouth as needed.  . mometasone (NASONEX) 50 MCG/ACT nasal spray Place 2 sprays into the nose as needed.    . montelukast (SINGULAIR) 10 MG tablet Take 1 tablet (10 mg total) by mouth at bedtime. (Patient taking differently: Take 10 mg by mouth daily as needed. )   . [DISCONTINUED] famotidine (PEPCID) 10 MG tablet Take 10 mg by mouth daily.    No facility-administered encounter medications on file as of 06/15/2016.     Functional Ability / Safety Screening 1.  Was the timed Get Up and Go test longer than 30 seconds?  no 2.  Does the patient need help with the phone, transportation, shopping,      preparing meals, housework, laundry, medications, or managing money?  no 3.  Does the patient's home have:  loose throw rugs in the hallway?   yes , taking those up      Grab bars in the bathroom? no      Handrails on the stairs?   no daily stairs      Poor lighting?   no 4.  Has the patient noticed any hearing difficulties?   yes; not that bad yet; she'll just watch and wait  Fall Risk Assessment See under rooming  Depression Screen See under rooming Depression screen [facility] 2/9 06/15/2016 05/25/2016 02/14/2016 11/09/2015 05/24/2015  Decreased Interest 0 0 0 0 0  Down, Depressed, Hopeless 0 1 0 1 1  PHQ - 2 Score 0 1 0 1 1    Advanced Directives Does patient have a HCPOA?    no If yes, name and contact information:  Does patient have a living will or MOST form?  no  Objective:   Vitals: BP 128/82   Temp 98.4 F (36.9 C) (Oral)   Ht 5\' 9"  (1.753 m)   Wt 203 lb 14.4 oz (92.5 kg)   LMP  (LMP Unknown)   SpO2 94%   BMI 30.11 kg/m  Body mass index is 30.11 kg/m. No exam data present  Physical Exam Mood/affect:  euthymic Appearance:  Casually dressed, wnwd not distressed  6CIT Screen 06/15/2016  What Year? 0 points  What month? 0 points  What time? 0 points  Count back from 20 0  points  Months in reverse 0 points  Repeat phrase 2 points  Total Score 2     Assessment & Plan:     Annual Wellness Visit  Reviewed patient's Family Medical History Reviewed and updated list of patient's medical providers  Assessment of cognitive impairment was done Assessed patient's functional ability Established a written schedule for health screening services Health Risk Assessent Completed and Reviewed  Immunization History  Administered Date(s) Administered  . Influenza-Unspecified 03/18/2014, 01/05/2015, 01/03/2016  . Pneumococcal Conjugate-13 11/18/2013  . Pneumococcal Polysaccharide-23 01/10/2014  . Td 01/10/2014  . Zoster 05/22/2014    Health Maintenance  Topic Date Due  . MAMMOGRAM  01/16/2018  . TETANUS/TDAP  01/11/2024  . COLONOSCOPY  07/09/2024  . INFLUENZA VACCINE  Completed  . DEXA SCAN  Addressed  . Hepatitis C Screening  Completed  . PNA vac Low Risk Adult  Completed    Discussed health benefits of physical activity, and encouraged her to engage in regular exercise appropriate for her age and condition.   No orders of the defined types were placed in this encounter.  Problem List Items Addressed This Visit      Musculoskeletal and Integument   Osteopenia    Order DEXA, fall precautions; vit D and calcium      Relevant Orders   DG Bone Density     Other   Preventative health care - Primary    USPSTF grade A and B recommendations reviewed with patient; age-appropriate recommendations, preventive care, screening tests, etc discussed and encouraged; healthy living encouraged; see AVS for patient education given to patient       Personal history of tobacco use, presenting hazards to health    Order chest CT; due in April      Relevant Orders   CT CHEST LUNG CA SCREEN LOW DOSE W/O CM    Other Visit Diagnoses    Screening for colon cancer       Relevant Orders   Ambulatory referral to Gastroenterology       Current Outpatient  Prescriptions:  .  aspirin EC 81 MG tablet, Take 1 tablet (81 mg total) by mouth daily., Disp: 30 tablet, Rfl: 11 .  Cholecalciferol (VITAMIN D) 2000 units CAPS, Take by mouth daily., Disp: , Rfl:  .  DULoxetine (CYMBALTA) 30 MG capsule, Take 1 capsule (30 mg total) by mouth daily., Disp: 30 capsule, Rfl: 0 .  ezetimibe (ZETIA) 10 MG tablet, Take 1 tablet (10 mg total) by mouth daily. (Patient not taking: Reported on 06/15/2016), Disp: 90 tablet, Rfl: 3 .  loratadine (CLARITIN) 10 MG tablet, Take by mouth daily as needed. , Disp: , Rfl:  .  mometasone (NASONEX) 50 MCG/ACT nasal spray, Place 2 sprays into the nose as needed. , Disp: , Rfl:  .  montelukast (SINGULAIR) 10 MG tablet, Take 1 tablet (10 mg total) by mouth at bedtime. (Patient taking differently: Take 10 mg by mouth daily as needed. ), Disp: 30 tablet, Rfl: 11 Medications Discontinued During This Encounter  Medication Reason  . famotidine (PEPCID) 10 MG tablet     Next Medicare Wellness Visit in 12+ months

## 2016-06-15 NOTE — Telephone Encounter (Signed)
Please send Zetia prescription to [facility] [place]. Please call patient once sent [phone]

## 2016-06-15 NOTE — Telephone Encounter (Signed)
I think Dana Peters did this earlier today

## 2016-06-21 ENCOUNTER — Other Ambulatory Visit: Payer: Self-pay | Admitting: Family Medicine

## 2016-06-22 ENCOUNTER — Telehealth: Payer: Self-pay | Admitting: Family Medicine

## 2016-06-22 NOTE — Telephone Encounter (Signed)
Pt has a cold which began Tuesday. No sore throat, congestion in chest, have no taste buds, no fever, no nasal congestion or runny nose she does have a cough. Please send to [facility]. [phone]

## 2016-06-22 NOTE — Telephone Encounter (Signed)
Spoke with pt to get a better understanding of her symptoms. Pt stated she sound like she has a  Cold, no fever, congestions, stuffy nose, no sore throat, cough. Spoke with Dr. lada and she mention to me the its sound like a common cold and for her to hydrate  vitamin C and plenty of rest. Let it run its course. No medication on our end for a common cold.  Also, mention to pt to avoid decongestants med's and med's that have a lot of alcohol. Also, if she still unsure please  speak with the pharmacist before getting and ovc medication.

## 2016-06-22 NOTE — Telephone Encounter (Signed)
I'm not sure [facility]

## 2016-06-24 ENCOUNTER — Ambulatory Visit (INDEPENDENT_AMBULATORY_CARE_PROVIDER_SITE_OTHER): Payer: Medicare HMO

## 2016-06-24 ENCOUNTER — Ambulatory Visit
Admission: EM | Admit: 2016-06-24 | Discharge: 2016-06-24 | Disposition: A | Discharge status: Home / Self Care | Payer: Medicare HMO | Attending: Emergency Medicine | Admitting: Emergency Medicine

## 2016-06-24 DIAGNOSIS — I4510 Unspecified right bundle-branch block: Secondary | ICD-10-CM | POA: Insufficient documentation

## 2016-06-24 DIAGNOSIS — J069 Acute upper respiratory infection, unspecified: Secondary | ICD-10-CM | POA: Diagnosis not present

## 2016-06-24 DIAGNOSIS — R05 Cough: Secondary | ICD-10-CM | POA: Diagnosis not present

## 2016-06-24 DIAGNOSIS — R69 Illness, unspecified: Secondary | ICD-10-CM | POA: Diagnosis not present

## 2016-06-24 DIAGNOSIS — R079 Chest pain, unspecified: Secondary | ICD-10-CM | POA: Diagnosis present

## 2016-06-24 MED ORDER — ALBUTEROL SULFATE HFA 108 (90 BASE) MCG/ACT IN AERS: 1.0000 | INHALATION_SPRAY | Freq: Four times a day (QID) | RESPIRATORY_TRACT | 0 refills | Status: DC | PRN

## 2016-06-24 MED ORDER — AEROCHAMBER PLUS MISC: 2 refills | Status: DC

## 2016-06-24 MED ORDER — BENZONATATE 200 MG PO CAPS: 200.0000 mg | ORAL_CAPSULE | Freq: Three times a day (TID) | ORAL | 0 refills | Status: DC | PRN

## 2016-06-24 MED ORDER — PREDNISONE 20 MG PO TABS: 40.0000 mg | ORAL_TABLET | Freq: Every day | ORAL | 0 refills | Status: AC

## 2016-06-24 NOTE — Discharge Instructions (Signed)
Restart your Singulair, Nasonex, and Claritin. Start some saline nasal irrigation with a neti pot or Lloyd Hugereil med sinus rinse to help prevent a secondary sinus infection. 2 puffs from your albuterol inhaler every 4-6 hours this will help with her chest congestion and cough that should these spacer. Have pharmacist show you how to use this. Tessalon Perles for the cough as well.  You will need to follow up with a cardiologist of your choice because you have a new incomplete right bundle branch block. Most often you do not need to do anything for this, but it is good idea to be evaluated by a specialist to make sure of this.

## 2016-06-24 NOTE — ED Provider Notes (Signed)
HPI  SUBJECTIVE:  Dana Peters is a 71 y.o. female who presents with cough productive of occasional white phlegm for the past 4 days. She reports chills, body aches and a sore throat secondary to the cough. States that she got better and then got worse. She reports intermittent substernal, central chest pressure described as congestion with no radiation to her neck, arm, back. States it lasts approximately 30 minutes and then resolves. There is no exertional component. No nausea, vomiting, diaphoresis. She denies lower extremity edema, unintentional weight gain, orthopnea, PND, abdominal pain, nocturia. States that she has had identical chest pressure like this before with URIs previously. She denies other chest pain, shortness of breath, wheezing. No GERD symptoms. She reports allergy symptoms starting this morning with nasal congestion, rhinorrhea, postnasal drip, itchy and watery eyes. She denies dyspnea on exertion, posttussive emesis, chest tightness or heaviness. She tried multiple over-the-counter medications without improvement in her symptoms. There are no aggravating factors. She has not taken any antipyretic in the past 6-8 hours. No antibiotics in the past month. States that she has coughing spells at night. She did not get a flu shot this year. She has had contacts with the flu. She has a past medical history of GERD, seasonal allergies, hypercholesterolemia, hypertension, chronic kidney disease, coronary artery calcifications seen on CT scan and peripheral arterial disease in both of her feet. She is a former smoker has a 25-pack-year history of smoking quit 15 years ago. States that she had normal pulmonary function testing since and has no diagnosis of asthma, emphysema, COPD. No history of MI,  diabetes, CHF. PMD: Baruch Gouty, MD     Past Medical History:  Diagnosis Date  . Allergy   . CKD (chronic kidney disease) stage 2, GFR 60-89 ml/min   . Dyslipidemia   . GERD  (gastroesophageal reflux disease)   . Hypertension   . Lumbar spinal stenosis   . Neuropathy (HCC)    both feet  . Obesity (BMI 30.0-34.9) 05/29/2016  . Osteopenia 06/15/2016   March 2018  . Personal history of tobacco use, presenting hazards to health 07/14/2015  . Reflux   . Sciatica   . Venous stasis   . Vitamin D deficiency disease     Past Surgical History:  Procedure Laterality Date  . ABDOMINAL HYSTERECTOMY     complete, due to heavy  . ECTOPIC PREGNANCY SURGERY      Family History  Problem Relation Age of Onset  . Cancer Mother     colon  . Hypertension Mother   . Alzheimer's disease Mother   . Cancer Father     prostate  . Cancer Brother     lung  . Stroke Brother   . Cancer Brother     prostate and brain  . Cancer Brother     throat  . Diabetes Paternal Aunt   . Breast cancer Paternal Aunt   . Healthy Daughter   . Cirrhosis Brother     Social History  Substance Use Topics  . Smoking status: Former Smoker    Packs/day: 1.00    Years: 30.00    Types: Cigarettes    Quit date: 10/13/2005  . Smokeless tobacco: Former Neurosurgeon    Types: Snuff    Quit date: 10/09/2011  . Alcohol use No     Comment: Rare    No current facility-administered medications for this encounter.   Current Outpatient Prescriptions:  .  aspirin EC 81 MG tablet, Take 1 tablet (  81 mg total) by mouth daily., Disp: 30 tablet, Rfl: 11 .  Cholecalciferol (VITAMIN D) 2000 units CAPS, Take by mouth daily., Disp: , Rfl:  .  DULoxetine (CYMBALTA) 30 MG capsule, TAKE ONE CAPSULE BY MOUTH DAILY, Disp: 30 capsule, Rfl: 6 .  ezetimibe (ZETIA) 10 MG tablet, Take 1 tablet (10 mg total) by mouth daily., Disp: 90 tablet, Rfl: 3 .  loratadine (CLARITIN) 10 MG tablet, Take by mouth daily as needed. , Disp: , Rfl:  .  mometasone (NASONEX) 50 MCG/ACT nasal spray, Place 2 sprays into the nose as needed. , Disp: , Rfl:  .  montelukast (SINGULAIR) 10 MG tablet, Take 1 tablet (10 mg total) by mouth at bedtime.  (Patient taking differently: Take 10 mg by mouth daily as needed. ), Disp: 30 tablet, Rfl: 11 .  albuterol (PROVENTIL HFA;VENTOLIN HFA) 108 (90 Base) MCG/ACT inhaler, Inhale 1-2 puffs into the lungs every 6 (six) hours as needed for wheezing or shortness of breath., Disp: 1 Inhaler, Rfl: 0 .  benzonatate (TESSALON) 200 MG capsule, Take 1 capsule (200 mg total) by mouth 3 (three) times daily as needed for cough., Disp: 30 capsule, Rfl: 0 .  predniSONE (DELTASONE) 20 MG tablet, Take 2 tablets (40 mg total) by mouth daily with breakfast., Disp: 10 tablet, Rfl: 0 .  Spacer/Aero-Holding Chambers (AEROCHAMBER PLUS) inhaler, Use as instructed, Disp: 1 each, Rfl: 2  Allergies  Allergen Reactions  . Atorvastatin Itching  . Codeine Sulfate Itching  . Pravastatin Nausea Only  . Protonix [Pantoprazole Sodium] Other (See Comments)    Legs felt heavy  . Tylenol With Codeine #3  [Acetaminophen-Codeine] Itching  . Penicillin G Rash     ROS  As noted in HPI.   Physical Exam  BP 119/70 (BP Location: Left Arm)   Pulse 70   Temp 99.2 F (37.3 C) (Oral)   Resp 16   Ht 5\' 8"  (1.727 m)   Wt 203 lb (92.1 kg)   LMP  (LMP Unknown)   SpO2 96%   BMI 30.87 kg/m   Constitutional: Well developed, well nourished, no acute distress Eyes: PERRL, EOMI, conjunctiva normal bilaterally HENT: Normocephalic, atraumatic,mucus membranes moist no nasal congestion. Normal turbinates. Extensive postnasal drip Respiratory: Clear to auscultation bilaterally, no rales, no wheezing, no rhonchi, no chest wall tenderness Cardiovascular: Normal rate and rhythm, no murmurs, no gallops, no rubs GI: nondistended skin: No rash, skin intact Musculoskeletal: Calves symmetric, nontender No edema, no tenderness, no deformities Neurologic: Alert & oriented x 3, CN II-XII grossly intact, no motor deficits, sensation grossly intact Psychiatric: Speech and behavior appropriate   ED Course   Medications - No data to  display  Orders Placed This Encounter  Procedures  . DG Chest 2 View    Standing Status:   Standing    Number of Occurrences:   1    Order Specific Question:   Reason for Exam (SYMPTOM  OR DIAGNOSIS REQUIRED)    Answer:   cough r/o PNA, CHF, pulm edema  . ED EKG    Standing Status:   Standing    Number of Occurrences:   1    Order Specific Question:   Reason for Exam    Answer:   Chest Pain  . EKG 12-Lead    Standing Status:   Standing    Number of Occurrences:   1  . EKG 12-Lead    Standing Status:   Standing    Number of Occurrences:   1  No results found for this or any previous visit (from the past 24 hour(s)). Dg Chest 2 View  Result Date: 06/24/2016 CLINICAL DATA:  Pt with cough x three days. Hx of previously smoker, hypertension. EXAM: CHEST  2 VIEW COMPARISON:  None. FINDINGS: Grossly unchanged cardiac silhouette and mediastinal contours. No focal airspace opacities. No pleural effusion or pneumothorax. No evidence of edema. No acute osseus abnormalities. Stigmata of DISH within the thoracic spine. IMPRESSION: No acute cardiopulmonary disease. Electronically Signed   By: Simonne ComeJohn  Watts M.D.   On: 06/24/2016 14:48    ED Clinical Impression  Upper respiratory tract infection, unspecified type  Incomplete right bundle branch block   ED Assessment/Plan  Patient takes Claritin and montelukast as needed and Nasonex which she states she has plenty of.  EKG: Normal sinus rhythm, rate 77. Normal axis, incomplete right bundle branch block. No hypertrophy. No ST-T wave changes compared to previous EKG. Incomplete right bundle branch block is new.  X-ray: Normal. See radiology report for details.  Presentation most consistent with a URI. She is out of the window for Tamiflu. She does not have a pneumonia. Her chest tightness is most likely from reactive airways rather than a cardiac cause. Discussed the incomplete right bundle-branch block with patient and advised her to follow-up  with a cardiologist of her choice.  Plan to for her to restart her Claritin, Singulair, Nasonex, albuterol inhaler with a spacer, 40 milligrams prednisone for 5 days, and some Tessalon Perles.  Discussed EKG, imaging, MDM, plan and followup with patient. Discussed sn/sx that should prompt return to the ED. Patient agrees with plan.   Meds ordered this encounter  Medications  . predniSONE (DELTASONE) 20 MG tablet    Sig: Take 2 tablets (40 mg total) by mouth daily with breakfast.    Dispense:  10 tablet    Refill:  0  . Spacer/Aero-Holding Chambers (AEROCHAMBER PLUS) inhaler    Sig: Use as instructed    Dispense:  1 each    Refill:  2  . albuterol (PROVENTIL HFA;VENTOLIN HFA) 108 (90 Base) MCG/ACT inhaler    Sig: Inhale 1-2 puffs into the lungs every 6 (six) hours as needed for wheezing or shortness of breath.    Dispense:  1 Inhaler    Refill:  0  . benzonatate (TESSALON) 200 MG capsule    Sig: Take 1 capsule (200 mg total) by mouth 3 (three) times daily as needed for cough.    Dispense:  30 capsule    Refill:  0    *This clinic note was created using Scientist, clinical (histocompatibility and immunogenetics)Dragon dictation software. Therefore, there may be occasional mistakes despite careful proofreading.  ?   Domenick GongAshley Zaden Sako, MD 06/24/16 [phone]

## 2016-06-24 NOTE — ED Triage Notes (Signed)
Patient complains of cough, congestion, body aches, chills. Patient states that this started on Wednesday.

## 2016-07-05 ENCOUNTER — Telehealth: Payer: Self-pay | Admitting: *Deleted

## 2016-07-05 DIAGNOSIS — Z87891 Personal history of nicotine dependence: Secondary | ICD-10-CM

## 2016-07-05 NOTE — Telephone Encounter (Signed)
Notified patient that annual lung cancer screening low dose CT scan is due. Confirmed that patient is within the age range of 55-77, and asymptomatic, (no signs or symptoms of lung cancer). Patient denies illness that would prevent curative treatment for lung cancer if found. The patient is a former smoker, quit 2007, with a 30 pack year history. The shared decision making visit was done 07/14/15. Patient is agreeable for CT scan being scheduled.

## 2016-07-12 ENCOUNTER — Telehealth: Payer: Self-pay | Admitting: Family Medicine

## 2016-07-12 NOTE — Telephone Encounter (Signed)
Order has been faxed to [facility])  P: [phone] F: [phone]  They will call the patient and schedule the appointment.

## 2016-07-12 NOTE — Telephone Encounter (Signed)
Patient called over to [facility] to schedule her bone density but they told her that we had to set it up. Please return pt call once this has been taken care of.  [phone]

## 2016-07-13 NOTE — Telephone Encounter (Signed)
Pt verbally informed. °

## 2016-07-18 DIAGNOSIS — R69 Illness, unspecified: Secondary | ICD-10-CM | POA: Diagnosis not present

## 2016-07-18 DIAGNOSIS — Z780 Asymptomatic menopausal state: Secondary | ICD-10-CM | POA: Diagnosis not present

## 2016-07-18 DIAGNOSIS — M8589 Other specified disorders of bone density and structure, multiple sites: Secondary | ICD-10-CM | POA: Diagnosis not present

## 2016-07-18 LAB — HM DEXA SCAN: HM Dexa Scan: ABNORMAL

## 2016-07-25 ENCOUNTER — Ambulatory Visit
Admission: RE | Admit: 2016-07-25 | Discharge: 2016-07-25 | Disposition: A | Discharge status: Home / Self Care | Payer: Medicare HMO | Source: Ambulatory Visit | Attending: Oncology | Admitting: Oncology

## 2016-07-25 DIAGNOSIS — I2510 Atherosclerotic heart disease of native coronary artery without angina pectoris: Secondary | ICD-10-CM | POA: Insufficient documentation

## 2016-07-25 DIAGNOSIS — Z87891 Personal history of nicotine dependence: Secondary | ICD-10-CM | POA: Diagnosis present

## 2016-07-25 DIAGNOSIS — I700 Atherosclerosis of aorta: Secondary | ICD-10-CM | POA: Diagnosis not present

## 2016-07-28 ENCOUNTER — Encounter: Payer: Self-pay | Admitting: *Deleted

## 2016-07-31 ENCOUNTER — Telehealth: Payer: Self-pay | Admitting: Family Medicine

## 2016-07-31 DIAGNOSIS — M8589 Other specified disorders of bone density and structure, multiple sites: Secondary | ICD-10-CM

## 2016-07-31 NOTE — Telephone Encounter (Signed)
Please let patient know the following:  Your bone density shows that you have osteopenia, which means that your bone is thinner than it should be but not as far along as osteoporosis.  Your risk of a fracture is slightly elevated, but not to the point of requiring medication at this point. We should get another bone scan in two years to monitor. Please do practice fall precautions (don't get up on chairs or ladders to reach high things, don't go out on slippery steps in the winter, always use handrails when going up or down stairs, etc.). Try to get three servings of calcium a day (best in foods/drinks like kale, spinach, broccoli, almond milk, tofu, etc.).  Please update HM list; DEXA done 07/18/2016; next due 2 years

## 2016-07-31 NOTE — Telephone Encounter (Signed)
Pt notified; Updated in HM

## 2016-08-01 ENCOUNTER — Encounter: Payer: Self-pay | Admitting: Family Medicine

## 2016-08-01 DIAGNOSIS — I700 Atherosclerosis of aorta: Secondary | ICD-10-CM | POA: Insufficient documentation

## 2016-08-01 HISTORY — DX: Atherosclerosis of aorta: I70.0

## 2016-08-14 DIAGNOSIS — H35363 Drusen (degenerative) of macula, bilateral: Secondary | ICD-10-CM | POA: Diagnosis not present

## 2016-08-14 DIAGNOSIS — H25012 Cortical age-related cataract, left eye: Secondary | ICD-10-CM | POA: Diagnosis not present

## 2016-08-14 DIAGNOSIS — H25013 Cortical age-related cataract, bilateral: Secondary | ICD-10-CM | POA: Diagnosis not present

## 2016-08-14 DIAGNOSIS — H2513 Age-related nuclear cataract, bilateral: Secondary | ICD-10-CM | POA: Diagnosis not present

## 2016-08-14 DIAGNOSIS — H35033 Hypertensive retinopathy, bilateral: Secondary | ICD-10-CM | POA: Diagnosis not present

## 2016-08-14 DIAGNOSIS — H2512 Age-related nuclear cataract, left eye: Secondary | ICD-10-CM | POA: Diagnosis not present

## 2016-08-23 ENCOUNTER — Ambulatory Visit (INDEPENDENT_AMBULATORY_CARE_PROVIDER_SITE_OTHER): Payer: Medicare HMO | Admitting: Family Medicine

## 2016-08-23 ENCOUNTER — Encounter: Payer: Self-pay | Admitting: Family Medicine

## 2016-08-23 VITALS — BP 130/80 | HR 88 | Temp 98.0°F | Resp 16 | Ht 69.0 in | Wt 204.1 lb

## 2016-08-23 DIAGNOSIS — J301 Allergic rhinitis due to pollen: Secondary | ICD-10-CM | POA: Diagnosis not present

## 2016-08-23 DIAGNOSIS — J181 Lobar pneumonia, unspecified organism: Secondary | ICD-10-CM | POA: Diagnosis not present

## 2016-08-23 DIAGNOSIS — R05 Cough: Secondary | ICD-10-CM | POA: Diagnosis not present

## 2016-08-23 DIAGNOSIS — R059 Cough, unspecified: Secondary | ICD-10-CM

## 2016-08-23 DIAGNOSIS — J189 Pneumonia, unspecified organism: Secondary | ICD-10-CM

## 2016-08-23 MED ORDER — MONTELUKAST SODIUM 10 MG PO TABS: 10.0000 mg | ORAL_TABLET | Freq: Every day | ORAL | 1 refills | Status: DC | PRN

## 2016-08-23 MED ORDER — LORATADINE 10 MG PO TABS: 10.0000 mg | ORAL_TABLET | Freq: Every day | ORAL | 1 refills | Status: DC | PRN

## 2016-08-23 MED ORDER — BENZONATATE 100 MG PO CAPS: 100.0000 mg | ORAL_CAPSULE | Freq: Two times a day (BID) | ORAL | 0 refills | Status: DC | PRN

## 2016-08-23 MED ORDER — AZITHROMYCIN 250 MG PO TABS: ORAL_TABLET | ORAL | 0 refills | Status: DC

## 2016-08-23 NOTE — Patient Instructions (Addendum)
Community-Acquired Pneumonia, Adult Pneumonia is an infection of the lungs. One type of pneumonia can happen while a person is in a hospital. A different type can happen when a person is not in a hospital (community-acquired pneumonia). It is easy for this kind to spread from person to person. It can spread to you if you breathe near an infected person who coughs or sneezes. Some symptoms include:  A dry cough.  A wet (productive) cough.  Fever.  Sweating.  Chest pain. Follow these instructions at home:  Take over-the-counter and prescription medicines only as told by your doctor.  Only take cough medicine if you are losing sleep.  If you were prescribed an antibiotic medicine, take it as told by your doctor. Do not stop taking the antibiotic even if you start to feel better.  Sleep with your head and neck raised (elevated). You can do this by putting a few pillows under your head, or you can sleep in a recliner.  Do not use tobacco products. These include cigarettes, chewing tobacco, and e-cigarettes. If you need help quitting, ask your doctor.  Drink enough water to keep your pee (urine) clear or pale yellow. A shot (vaccine) can help prevent pneumonia. Shots are often suggested for:  People older than 72 years of age.  People older than 72 years of age:  Who are having cancer treatment.  Who have long-term (chronic) lung disease.  Who have problems with their body's defense system (immune system). You may also prevent pneumonia if you take these actions:  Get the flu (influenza) shot every year.  Go to the dentist as often as told.  Wash your hands often. If soap and water are not available, use hand sanitizer. Contact a doctor if:  You have a fever.  You lose sleep because your cough medicine does not help. Get help right away if:  You are short of breath and it gets worse.  You have more chest pain.  Your sickness gets worse. This is very serious if:  You  are an older adult.  Your body's defense system is weak.  You cough up blood. This information is not intended to replace advice given to you by your health care provider. Make sure you discuss any questions you have with your health care provider. Document Released: 09/13/2007 Document Revised: 09/02/2015 Document Reviewed: 07/22/2014 Elsevier Interactive Patient Education  2017 [employer]  Allergies An allergy is when your body reacts to a substance in a way that is not normal. An allergic reaction can happen after you:  Eat something.  Breathe in something.  Touch something. You can be allergic to:  Things that are only around during certain seasons, like molds and pollens.  Foods.  Drugs.  Insects.  Animal dander. What are the signs or symptoms?  Puffiness (swelling). This may happen on the lips, face, tongue, mouth, or throat.  Sneezing.  Coughing.  Breathing loudly (wheezing).  Stuffy nose.  Tingling in the mouth.  A rash.  Itching.  Itchy, red, puffy areas of skin (hives).  Watery eyes.  Throwing up (vomiting).  Watery poop (diarrhea).  Dizziness.  Feeling faint or fainting.  Trouble breathing or swallowing.  A tight feeling in the chest.  A fast heartbeat. How is this diagnosed? Allergies can be diagnosed with:  A medical and family history.  Skin tests.  Blood tests.  A food diary. A food diary is a record of all the foods, drinks, and symptoms you have each day.  The results of an elimination diet. This diet involves making sure not to eat certain foods and then seeing what happens when you start eating them again. How is this treated? There is no cure for allergies, but allergic reactions can be treated with medicine. Severe reactions usually need to be treated at a hospital. How is this prevented? The best way to prevent an allergic reaction is to avoid the thing you are allergic to. Allergy shots and medicines can also  help prevent reactions in some cases. This information is not intended to replace advice given to you by your health care provider. Make sure you discuss any questions you have with your health care provider. Document Released: 07/22/2012 Document Revised: 11/22/2015 Document Reviewed: 01/06/2014 Elsevier Interactive Patient Education  2017 [employer].

## 2016-08-23 NOTE — Progress Notes (Addendum)
Name: Dana Peters   MRN: [id number]    DOB: 01-10-1946   Date:08/23/2016       Progress Note  Subjective  Chief Complaint  Chief Complaint  Patient presents with  . URI    cough, congested, scratchy throat, sweats for 1 week    HPI  Pt presents with 7 day history of chest congestion, productive cough, and occasional wheezing, mild myalgias. She endorses some nasal congestion and sinus pressure, subjective fevers/chills.  Denies NVD or ear pain/pressure. Has history of allergic rhinitis and has been out of Claritin and montelukast for about a month.  Patient Active Problem List   Diagnosis Date Noted  . Thoracic aortic atherosclerosis (HCC) 08/01/2016  . Osteopenia 06/15/2016  . Obesity (BMI 30.0-34.9) 05/29/2016  . Urinary hesitancy 02/14/2016  . PAD (peripheral artery disease) (HCC) 09/09/2015  . Coronary artery calcification seen on CT scan 07/20/2015  . Personal history of tobacco use, presenting hazards to health 07/14/2015  . Preventative health care 06/05/2015  . Medication monitoring encounter 05/07/2015  . Breast cancer screening 03/12/2015  . Foraminal stenosis of lumbar region 01/28/2015  . Facet arthropathy, lumbar (HCC) 01/28/2015  . Spinal stenosis of lumbar region 01/28/2015  . Neuropathy of both feet 01/07/2015  . Leg pain, bilateral 01/07/2015  . Leg heaviness 01/07/2015  . CKD (chronic kidney disease) stage 2, GFR 60-89 ml/min 10/17/2014  . Allergic rhinitis 10/14/2014  . Allergic conjunctivitis 10/14/2014  . Dyslipidemia 10/14/2014  . Hypertension   . Sciatica   . Vitamin D deficiency disease   . Acid reflux 02/02/2014  . Neuralgia neuritis, sciatic nerve 08/28/2012    Social History  Substance Use Topics  . Smoking status: Former Smoker    Packs/day: 1.00    Years: 30.00    Types: Cigarettes    Quit date: 10/13/2005  . Smokeless tobacco: Former Neurosurgeon    Types: Snuff    Quit date: 10/09/2011  . Alcohol use No     Comment: Rare      Current Outpatient Prescriptions:  .  albuterol (PROVENTIL HFA;VENTOLIN HFA) 108 (90 Base) MCG/ACT inhaler, Inhale 1-2 puffs into the lungs every 6 (six) hours as needed for wheezing or shortness of breath., Disp: 1 Inhaler, Rfl: 0 .  aspirin EC 81 MG tablet, Take 1 tablet (81 mg total) by mouth daily., Disp: 30 tablet, Rfl: 11 .  Cholecalciferol (VITAMIN D) 2000 units CAPS, Take by mouth daily., Disp: , Rfl:  .  DULoxetine (CYMBALTA) 30 MG capsule, TAKE ONE CAPSULE BY MOUTH DAILY, Disp: 30 capsule, Rfl: 6 .  ezetimibe (ZETIA) 10 MG tablet, Take 1 tablet (10 mg total) by mouth daily., Disp: 90 tablet, Rfl: 3 .  loratadine (CLARITIN) 10 MG tablet, Take by mouth daily as needed. , Disp: , Rfl:  .  mometasone (NASONEX) 50 MCG/ACT nasal spray, Place 2 sprays into the nose as needed. , Disp: , Rfl:  .  montelukast (SINGULAIR) 10 MG tablet, Take 1 tablet (10 mg total) by mouth at bedtime. (Patient taking differently: Take 10 mg by mouth daily as needed. ), Disp: 30 tablet, Rfl: 11 .  Spacer/Aero-Holding Chambers (AEROCHAMBER PLUS) inhaler, Use as instructed, Disp: 1 each, Rfl: 2  Allergies  Allergen Reactions  . Atorvastatin Itching  . Codeine Sulfate Itching  . Pravastatin Nausea Only  . Protonix [Pantoprazole Sodium] Other (See Comments)    Legs felt heavy  . Tylenol With Codeine #3  [Acetaminophen-Codeine] Itching  . Penicillin G Rash    ROS  Constitutional: Negative for fever or weight change.  HEENT: See HPI Respiratory: Positive for cough and denies shortness of breath.   Cardiovascular: Negative for chest pain or palpitations.  Gastrointestinal: Negative for abdominal pain, no bowel changes.  Musculoskeletal: Negative for gait problem or joint swelling.  Skin: Negative for rash.  Neurological: Negative for dizziness or headache.  No other specific complaints in a complete review of systems (except as listed in HPI above).  Objective  Vitals:   08/23/16 1321  BP: 130/80   Pulse: 88  Resp: 16  Temp: 98 F (36.7 C)  TempSrc: Oral  SpO2: 94%  Weight: 204 lb 1.6 oz (92.6 kg)  Height: 5\' 9"  (1.753 m)    Body mass index is 30.14 kg/m.  Nursing Note and Vital Signs reviewed.  Reassessed SpO2 - 96% sitting, 95% with ambulation.  Physical Exam  Constitutional: Patient appears well-developed and well-nourished. No distress.  HEENT: head atraumatic, normocephalic, pupils equal and reactive to light, EOM's intact, TM's without erythema or bulging, no maxillary or frontal sinus pain on palpation, neck supple without lymphadenopathy, oropharynx pink and moist without exudate, turbinates mildly hypertrophied Cardiovascular: Normal rate, regular rhythm, S1/S2 present.  No murmur or rub heard. No BLE edema. Pulmonary/Chest: Effort normal and breath sounds diminished in LLL with expiratory wheeze in BLL. No respiratory distress or retractions. Psychiatric: Patient has a normal mood and affect. behavior is normal. Judgment and thought content normal.  Recent Results (from the past 2160 hour(s))  HM DEXA SCAN     Status: None   Collection Time: 07/18/16 12:00 AM  Result Value Ref Range   HM Dexa Scan abnormal     Comment: osteopenis multiple spots   Assessment & Plan  1. Seasonal allergic rhinitis due to pollen  - loratadine (CLARITIN) 10 MG tablet; Take 1 tablet (10 mg total) by mouth daily as needed.  Dispense: 90 tablet; Refill: 1 - montelukast (SINGULAIR) 10 MG tablet; Take 1 tablet (10 mg total) by mouth daily as needed.  Dispense: 90 tablet; Refill: 1 - Continue to use Nasonex daily.  2. Community acquired pneumonia of left lower lobe of lung (HCC)  - azithromycin (ZITHROMAX) 250 MG tablet; Take 2 pills on day one, then 1 pill on days two through five.  Dispense: 6 tablet; Refill: 0 -Discussed needing to drink plenty of fluids, remain active, and call back/RTC per Red flags below.  3. Cough  - benzonatate (TESSALON) 100 MG capsule; Take 1 capsule (100  mg total) by mouth 2 (two) times daily as needed for cough.  Dispense: 20 capsule; Refill: 0  -Red flags and when to present for emergency care or RTC including fever >101.44F, chest pain, shortness of breath, new/worsening/un-resolving symptoms,  reviewed with patient at time of visit. Follow up and care instructions discussed and provided in AVS. -Reviewed Health Maintenance: UTD, will s/c chronic follow up in September 2018  I have reviewed this encounter including the documentation in this note and/or discussed this patient with the Deboraha Sprangprovider,Yandiel Bergum, FNP, NP-C. I am certifying that I agree with the content of this note as supervising physician.  Alba CoryKrichna Sowles, MD [facility] 08/23/2016, 2:42 PM

## 2016-08-29 DIAGNOSIS — H2512 Age-related nuclear cataract, left eye: Secondary | ICD-10-CM | POA: Diagnosis not present

## 2016-08-29 DIAGNOSIS — H25812 Combined forms of age-related cataract, left eye: Secondary | ICD-10-CM | POA: Diagnosis not present

## 2016-09-05 DIAGNOSIS — R69 Illness, unspecified: Secondary | ICD-10-CM | POA: Diagnosis not present

## 2016-09-13 ENCOUNTER — Telehealth: Payer: Self-pay | Admitting: Family Medicine

## 2016-09-13 NOTE — Telephone Encounter (Signed)
[facility] will transfer over Zetia and pt has been notified.

## 2016-09-13 NOTE — Telephone Encounter (Signed)
Pt requesting refill on zetia. She has switched pharmacy and is asking that you send to [facility]. [phone]

## 2016-09-18 DIAGNOSIS — H2511 Age-related nuclear cataract, right eye: Secondary | ICD-10-CM | POA: Diagnosis not present

## 2016-09-18 DIAGNOSIS — H25011 Cortical age-related cataract, right eye: Secondary | ICD-10-CM | POA: Diagnosis not present

## 2016-09-26 DIAGNOSIS — H25811 Combined forms of age-related cataract, right eye: Secondary | ICD-10-CM | POA: Diagnosis not present

## 2016-09-26 DIAGNOSIS — H2511 Age-related nuclear cataract, right eye: Secondary | ICD-10-CM | POA: Diagnosis not present

## 2016-09-28 DIAGNOSIS — R69 Illness, unspecified: Secondary | ICD-10-CM | POA: Diagnosis not present

## 2016-10-04 ENCOUNTER — Telehealth: Payer: Self-pay

## 2016-10-04 NOTE — Telephone Encounter (Signed)
Pt states her BP reading is higher then usual, 165/78. Pt states she might be having a reaction to a medicine that she was put on. She states she is having heavy sweating and she had catract surgery recently and because the heavy sweating it gets in her eyes. Pt  States she  Wrote down the medicine and was out running errands and forgot what medicine. She denies any headaches, dizziness, chest pain, sob. I suggest to pt if her BP is  higher then usual and she is concern please go to the nearest urgent care. Pt states she think she will be fine and rather schedule appt. Informed pt Dr. lada is out for the rest of the week but we do have Irving Burtonmily if she denies going to urgent care she can get on the schedule for tomorrow. Before [facility]fering [facility] to [employer] [employer] [facility] I stated to pt if her symptoms get any worst please go to nearest Urgent care. Pt agreed.

## 2016-10-05 ENCOUNTER — Ambulatory Visit (INDEPENDENT_AMBULATORY_CARE_PROVIDER_SITE_OTHER): Payer: Medicare HMO | Admitting: Family Medicine

## 2016-10-05 ENCOUNTER — Encounter: Payer: Self-pay | Admitting: Family Medicine

## 2016-10-05 VITALS — BP 130/76 | HR 94 | Temp 98.0°F | Resp 16 | Ht 69.0 in | Wt 209.5 lb

## 2016-10-05 DIAGNOSIS — I10 Essential (primary) hypertension: Secondary | ICD-10-CM | POA: Diagnosis not present

## 2016-10-05 DIAGNOSIS — T50905A Adverse effect of unspecified drugs, medicaments and biological substances, initial encounter: Secondary | ICD-10-CM

## 2016-10-05 DIAGNOSIS — L659 Nonscarring hair loss, unspecified: Secondary | ICD-10-CM | POA: Diagnosis not present

## 2016-10-05 DIAGNOSIS — R194 Change in bowel habit: Secondary | ICD-10-CM

## 2016-10-05 DIAGNOSIS — G5793 Unspecified mononeuropathy of bilateral lower limbs: Secondary | ICD-10-CM | POA: Diagnosis not present

## 2016-10-05 LAB — CBC WITH DIFFERENTIAL/PLATELET
Basophils Absolute: 62 {cells}/uL (ref 0–200)
Basophils Relative: 1 %
Eosinophils Absolute: 372 {cells}/uL (ref 15–500)
Eosinophils Relative: 6 %
HCT: 40.6 % (ref 35.0–45.0)
Hemoglobin: 13.5 g/dL (ref 11.7–15.5)
Lymphocytes Relative: 34 %
Lymphs Abs: 2108 {cells}/uL (ref 850–3900)
MCH: 32.1 pg (ref 27.0–33.0)
MCHC: 33.3 g/dL (ref 32.0–36.0)
MCV: 96.7 fL (ref 80.0–100.0)
MPV: 10.5 fL (ref 7.5–12.5)
Monocytes Absolute: 372 {cells}/uL (ref 200–950)
Monocytes Relative: 6 %
Neutro Abs: 3286 {cells}/uL (ref 1500–7800)
Neutrophils Relative %: 53 %
Platelets: 183 10*3/uL (ref 140–400)
RBC: 4.2 MIL/uL (ref 3.80–5.10)
RDW: 14 % (ref 11.0–15.0)
WBC: 6.2 10*3/uL (ref 3.8–10.8)

## 2016-10-05 LAB — TSH: TSH: 1.44 m[IU]/L

## 2016-10-05 MED ORDER — DULOXETINE HCL 20 MG PO CPEP: 20.0000 mg | ORAL_CAPSULE | Freq: Every day | ORAL | 0 refills | Status: DC

## 2016-10-05 MED ORDER — POLYETHYLENE GLYCOL 3350 17 GM/SCOOP PO POWD: 17.0000 g | Freq: Every day | ORAL | 1 refills | Status: DC

## 2016-10-05 NOTE — Progress Notes (Addendum)
Name: Dana Peters   MRN: [id number]    DOB: 1945-05-02   Date:10/05/2016       Progress Note  Subjective  Chief Complaint  Chief Complaint  Patient presents with  . Allergic Reaction    to Duloxetine, constipation and sweating since starting  . Hypertension    165/78 at [facility] appointment.     HPI  HTN: Pt was at eye doctor yesterday and had a BP reading of 165/78, and she became concerned that her BP was elevated. She also saw a surgeon about a week ago and was told her BP was slightly elevated then too. BP in office today is 130/76. She has been off of BP meds for about a year and has been doing well - does not check at home but is able to if needed.  Medication Side Effects: Pt has been taking Cymbalta for 2-3 months for BLE neuropathy and this has been very effective in helping her pain.  However, she has been experiencing constipation requiring stool softener (unsure of type) 3 days a week, and feeling very hot with diaphoresis throughout the day.  Also notes some hair loss recently, but has been using some new products with her beautician. Has gained 4.5lbs in the last month and has had a very good appetite.  No recent tick/insect bites, no tremors, no dry mouth, no skin or nail changes.   Patient Active Problem List   Diagnosis Date Noted  . Thoracic aortic atherosclerosis (HCC) 08/01/2016  . Osteopenia 06/15/2016  . Obesity (BMI 30.0-34.9) 05/29/2016  . Urinary hesitancy 02/14/2016  . PAD (peripheral artery disease) (HCC) 09/09/2015  . Coronary artery calcification seen on CT scan 07/20/2015  . Personal history of tobacco use, presenting hazards to health 07/14/2015  . Preventative health care 06/05/2015  . Medication monitoring encounter 05/07/2015  . Breast cancer screening 03/12/2015  . Foraminal stenosis of lumbar region 01/28/2015  . Facet arthropathy, lumbar (HCC) 01/28/2015  . Spinal stenosis of lumbar region 01/28/2015  . Neuropathy of both feet 01/07/2015  .  Leg pain, bilateral 01/07/2015  . Leg heaviness 01/07/2015  . CKD (chronic kidney disease) stage 2, GFR 60-89 ml/min 10/17/2014  . Allergic rhinitis 10/14/2014  . Allergic conjunctivitis 10/14/2014  . Dyslipidemia 10/14/2014  . Hypertension   . Sciatica   . Vitamin D deficiency disease   . Acid reflux 02/02/2014  . Neuralgia neuritis, sciatic nerve 08/28/2012    Social History  Substance Use Topics  . Smoking status: Former Smoker    Packs/day: 1.00    Years: 30.00    Types: Cigarettes    Quit date: 10/13/2005  . Smokeless tobacco: Former Neurosurgeon    Types: Snuff    Quit date: 10/09/2011  . Alcohol use No     Comment: Rare     Current Outpatient Prescriptions:  .  albuterol (PROVENTIL HFA;VENTOLIN HFA) 108 (90 Base) MCG/ACT inhaler, Inhale 1-2 puffs into the lungs every 6 (six) hours as needed for wheezing or shortness of breath., Disp: 1 Inhaler, Rfl: 0 .  aspirin EC 81 MG tablet, Take 1 tablet (81 mg total) by mouth daily., Disp: 30 tablet, Rfl: 11 .  Cholecalciferol (VITAMIN D) 2000 units CAPS, Take by mouth daily., Disp: , Rfl:  .  DULoxetine (CYMBALTA) 30 MG capsule, TAKE ONE CAPSULE BY MOUTH DAILY, Disp: 30 capsule, Rfl: 6 .  ezetimibe (ZETIA) 10 MG tablet, Take 1 tablet (10 mg total) by mouth daily., Disp: 90 tablet, Rfl: 3 .  loratadine (  CLARITIN) 10 MG tablet, Take 1 tablet (10 mg total) by mouth daily as needed., Disp: 90 tablet, Rfl: 1 .  mometasone (NASONEX) 50 MCG/ACT nasal spray, Place 2 sprays into the nose as needed. , Disp: , Rfl:  .  montelukast (SINGULAIR) 10 MG tablet, Take 1 tablet (10 mg total) by mouth daily as needed., Disp: 90 tablet, Rfl: 1 .  Spacer/Aero-Holding Chambers (AEROCHAMBER PLUS) inhaler, Use as instructed, Disp: 1 each, Rfl: 2  Allergies  Allergen Reactions  . Atorvastatin Itching  . Codeine Sulfate Itching  . Pravastatin Nausea Only  . Protonix [Pantoprazole Sodium] Other (See Comments)    Legs felt heavy  . Tylenol With Codeine #3   [Acetaminophen-Codeine] Itching  . Penicillin G Rash    ROS  Ten systems reviewed and is negative except as mentioned in HPI  Objective  Vitals:   10/05/16 1000  BP: 130/76  Pulse: 94  Resp: 16  Temp: 98 F (36.7 C)  TempSrc: Oral  SpO2: 92%  Weight: 209 lb 8 oz (95 kg)  Height: 5\' 9"  (1.753 m)   Body mass index is 30.94 kg/m.  Nursing Note and Vital Signs reviewed.  Physical Exam  Constitutional: Patient appears well-developed and well-nourished. Obese No distress.  HEENT: head atraumatic, normocephalic, pupils equal and reactive to light, EOM's intact, neck supple without lymphadenopathy or thyroid enlargement Cardiovascular: Normal rate, regular rhythm, S1/S2 present.  No murmur or rub heard. No BLE edema. Pulmonary/Chest: Effort normal and breath sounds clear. No respiratory distress or retractions. Abdominal: Soft and non-tender, bowel sounds present x4 quadrants. Psychiatric: Patient has a normal mood and affect. behavior is normal. Judgment and thought content normal.  Recent Results (from the past 2160 hour(s))  HM DEXA SCAN     Status: None   Collection Time: 07/18/16 12:00 AM  Result Value Ref Range   HM Dexa Scan abnormal     Comment: osteopenis multiple spots   Assessment & Plan  1. Medication side effects present, initial encounter - CBC w/Diff/Platelet - COMPLETE METABOLIC PANEL WITH GFR - Decrease dose of Cymbalta to 20mg  for now, follow up with PCP in 3 weeks for re-evaluation.  2. Essential hypertension - COMPLETE METABOLIC PANEL WITH GFR - Pt will check BP a few times a week and bring readings to her follow up with Dr. Sherie DonLada. Because she is below goal today, we will not start her on any medication.  Encouraged her to continue lifestyle changes.  3. Hair loss - TSH - CBC w/Diff/Platelet - COMPLETE METABOLIC PANEL WITH GFR - Unclear etiology, labs as above.  4. Recent change in frequency of bowel movements - TSH - polyethylene glycol powder  (GLYCOLAX/MIRALAX) powder; Take 17 g by mouth daily.  Dispense: 3350 g; Refill: 1 - CBC w/Diff/Platelet - COMPLETE METABOLIC PANEL WITH GFR - Decreased frequency in BM's. May be related to Cymbalta or thyroid dysfunction, and we will check this today.  5. Neuropathy of both feet - DULoxetine (CYMBALTA) 20 MG capsule; Take 1 capsule (20 mg total) by mouth daily.  Dispense: 30 capsule; Refill: 0  -Red flags and when to present for emergency care or RTC including fever >101.44F, chest pain, shortness of breath,  new/worsening/un-resolving symptoms, palpitations, syncope/near-syncope reviewed with patient at time of visit. Follow up and care instructions discussed and provided in AVS.  I have reviewed this encounter including the documentation in this note and/or discussed this patient with the Deboraha Sprangprovider,Caylie Sandquist, FNP, NP-C. I am certifying that I agree with the  content of this note as supervising physician.  Alba Cory, MD [facility] 10/08/2016, 5:32 PM

## 2016-10-06 ENCOUNTER — Other Ambulatory Visit: Payer: Self-pay | Admitting: Family Medicine

## 2016-10-06 DIAGNOSIS — R7309 Other abnormal glucose: Secondary | ICD-10-CM

## 2016-10-06 LAB — COMPLETE METABOLIC PANEL WITHOUT GFR
ALT: 30 U/L — ABNORMAL HIGH (ref 6–29)
AST: 23 U/L (ref 10–35)
Albumin: 4.3 g/dL (ref 3.6–5.1)
Alkaline Phosphatase: 93 U/L (ref 33–130)
BUN: 17 mg/dL (ref 7–25)
CO2: 31 mmol/L (ref 20–31)
Calcium: 10 mg/dL (ref 8.6–10.4)
Chloride: 103 mmol/L (ref 98–110)
Creat: 1.02 mg/dL — ABNORMAL HIGH (ref 0.60–0.93)
GFR, Est African American: 64 mL/min
GFR, Est Non African American: 55 mL/min — ABNORMAL LOW
Glucose, Bld: 107 mg/dL — ABNORMAL HIGH (ref 65–99)
Potassium: 4.4 mmol/L (ref 3.5–5.3)
Sodium: 141 mmol/L (ref 135–146)
Total Bilirubin: 0.6 mg/dL (ref 0.2–1.2)
Total Protein: 7 g/dL (ref 6.1–8.1)

## 2016-10-06 NOTE — Progress Notes (Signed)
Please notify patient: Normal thyroid, normal CBC. Her kidney function is slightly decreased and her glucose is slightly elevated. I am adding on a HgbA1C to check for diabetes. I want her to make sure she stays very well hydrated, and follow up as planned with Dr. Sherie DonLada. Thank you!

## 2016-10-17 ENCOUNTER — Telehealth: Payer: Self-pay | Admitting: Family Medicine

## 2016-10-17 NOTE — Telephone Encounter (Signed)
Could you please call lab and inquire about HgBA1C result? If it hasn't been run and it's too late, we can recheck at her next visit on 10/26/2016. Thanks!

## 2016-10-19 NOTE — Telephone Encounter (Signed)
Per Angie, no A1c was ordered. Will recheck at next visit

## 2016-10-26 ENCOUNTER — Ambulatory Visit (INDEPENDENT_AMBULATORY_CARE_PROVIDER_SITE_OTHER): Payer: Medicare HMO | Admitting: Family Medicine

## 2016-10-26 ENCOUNTER — Encounter: Payer: Self-pay | Admitting: Family Medicine

## 2016-10-26 VITALS — BP 126/74 | HR 84 | Temp 98.0°F | Resp 16 | Ht 68.0 in | Wt 209.6 lb

## 2016-10-26 DIAGNOSIS — E669 Obesity, unspecified: Secondary | ICD-10-CM | POA: Diagnosis not present

## 2016-10-26 DIAGNOSIS — R739 Hyperglycemia, unspecified: Secondary | ICD-10-CM | POA: Diagnosis not present

## 2016-10-26 DIAGNOSIS — E785 Hyperlipidemia, unspecified: Secondary | ICD-10-CM | POA: Diagnosis not present

## 2016-10-26 DIAGNOSIS — N182 Chronic kidney disease, stage 2 (mild): Secondary | ICD-10-CM

## 2016-10-26 DIAGNOSIS — I10 Essential (primary) hypertension: Secondary | ICD-10-CM | POA: Diagnosis not present

## 2016-10-26 DIAGNOSIS — E66811 Obesity, class 1: Secondary | ICD-10-CM

## 2016-10-26 NOTE — Assessment & Plan Note (Signed)
Avoid NSAIDs, stay hydrated 

## 2016-10-26 NOTE — Progress Notes (Signed)
BP 126/74   Pulse 84   Temp 98 F (36.7 C) (Oral)   Resp 16   Ht 5' 8" (1.727 m)   Wt 209 lb 9.6 oz (95.1 kg)   LMP  (LMP Unknown)   SpO2 99%   BMI 31.87 kg/m    Subjective:    Patient ID: Dana Peters, female    DOB: 05/20/1945, 71 y.o.   MRN: [id number]  HPI: Dana Peters is a 71 y.o. female  Chief Complaint  Patient presents with  . Follow-up    3 weeks    HPI Here for f/u She is interested in taking black seed oil extract  She was here in June and had labs checked Thyroid number in June was completely normal; energy level is good CBC was also normal Creatinine had bumped up a little; no NSAIDs; learning to be a better water drinker  Glucose was mildly elevated in June; had been eating more sweets but cut back; no dry mouth or blurred vision Has had cataract surgeries the last two months; vision is much better Father's side has some diabetes (aunts and cousin)  Obesity; she would like to get under 200; she thinks 195 pounds would be a healthy weight  High cholesterol; intolerant to statin; on zetia; avoiding fatty meats  Depression screen [facility] 2/9 10/26/2016 06/15/2016 05/25/2016 02/14/2016 11/09/2015  Decreased Interest 0 0 0 0 0  Down, Depressed, Hopeless 0 0 1 0 1  PHQ - 2 Score 0 0 1 0 1   Relevant past medical, surgical, family and social history reviewed Past Medical History:  Diagnosis Date  . Allergy   . CKD (chronic kidney disease) stage 2, GFR 60-89 ml/min   . Dyslipidemia   . GERD (gastroesophageal reflux disease)   . Hypertension   . Lumbar spinal stenosis   . Neuropathy    both feet  . Obesity (BMI 30.0-34.9) 05/29/2016  . Osteopenia 06/15/2016   March 2018  . Personal history of tobacco use, presenting hazards to health 07/14/2015  . Reflux   . Sciatica   . Thoracic aortic atherosclerosis ([place]) 08/01/2016   Chest CT  . Venous stasis   . Vitamin D deficiency disease    Past Surgical History:  Procedure Laterality Date  . ABDOMINAL  HYSTERECTOMY     complete, due to heavy  . CATARACT EXTRACTION, BILATERAL    . ECTOPIC PREGNANCY SURGERY     Family History  Problem Relation Age of Onset  . Cancer Mother        colon  . Hypertension Mother   . Alzheimer's disease Mother   . Cancer Father        prostate  . Cancer Brother        lung  . Stroke Brother   . Cancer Brother        prostate and brain  . Cancer Brother        throat  . Diabetes Paternal Aunt   . Breast cancer Paternal Aunt   . Healthy Daughter   . Cirrhosis Brother    Social History   Social History  . Marital status: Married    Spouse name: N/A  . Number of children: N/A  . Years of education: N/A   Occupational History  . Not on file.   Social History Main Topics  . Smoking status: Former Smoker    Packs/day: 1.00    Years: 30.00    Types: Cigarettes    Quit date:  10/13/2005  . Smokeless tobacco: Former Systems developer    Types: Snuff    Quit date: 10/09/2011  . Alcohol use No  . Drug use: No  . Sexual activity: Yes   Other Topics Concern  . Not on file   Social History Narrative   Lives with husband in a one story home.  Has one daughter.     Retired from [employer] work.     Education: high school.    Interim medical history since last visit reviewed. Allergies and medications reviewed  Review of Systems Per HPI unless specifically indicated above     Objective:    BP 126/74   Pulse 84   Temp 98 F (36.7 C) (Oral)   Resp 16   Ht 5' 8" (1.727 m)   Wt 209 lb 9.6 oz (95.1 kg)   LMP  (LMP Unknown)   SpO2 99%   BMI 31.87 kg/m   Wt Readings from Last 3 Encounters:  10/26/16 209 lb 9.6 oz (95.1 kg)  10/05/16 209 lb 8 oz (95 kg)  08/23/16 204 lb 1.6 oz (92.6 kg)    Physical Exam  Constitutional: She appears well-developed and well-nourished. No distress.  HENT:  Head: Normocephalic and atraumatic.  Eyes: EOM are normal. No scleral icterus.  Neck: No thyromegaly present.  Cardiovascular: Normal rate, regular rhythm and  normal heart sounds.   No murmur heard. Pulmonary/Chest: Effort normal and breath sounds normal. No respiratory distress. She has no wheezes.  Abdominal: Soft. Bowel sounds are normal. She exhibits no distension.  Musculoskeletal: Normal range of motion. She exhibits no edema.  Neurological: She is alert. She exhibits normal muscle tone.  Skin: Skin is warm and dry. She is not diaphoretic. No pallor.  Psychiatric: She has a normal mood and affect. Her behavior is normal. Judgment and thought content normal.    Results for orders placed or performed in visit on 10/05/16  TSH  Result Value Ref Range   TSH 1.44 mIU/L  CBC w/Diff/Platelet  Result Value Ref Range   WBC 6.2 3.8 - 10.8 K/uL   RBC 4.20 3.80 - 5.10 MIL/uL   Hemoglobin 13.5 11.7 - 15.5 g/dL   HCT 40.6 35.0 - 45.0 %   MCV 96.7 80.0 - 100.0 fL   MCH 32.1 27.0 - 33.0 pg   MCHC 33.3 32.0 - 36.0 g/dL   RDW 14.0 11.0 - 15.0 %   Platelets 183 140 - 400 K/uL   MPV 10.5 7.5 - 12.5 fL   Neutro Abs 3,286 1,500 - 7,800 cells/uL   Lymphs Abs 2,108 850 - 3,900 cells/uL   Monocytes Absolute 372 200 - 950 cells/uL   Eosinophils Absolute 372 15 - 500 cells/uL   Basophils Absolute 62 0 - 200 cells/uL   Neutrophils Relative % 53 %   Lymphocytes Relative 34 %   Monocytes Relative 6 %   Eosinophils Relative 6 %   Basophils Relative 1 %   Smear Review Criteria for review not met   COMPLETE METABOLIC PANEL WITH GFR  Result Value Ref Range   Sodium 141 135 - 146 mmol/L   Potassium 4.4 3.5 - 5.3 mmol/L   Chloride 103 98 - 110 mmol/L   CO2 31 20 - 31 mmol/L   Glucose, Bld 107 (H) 65 - 99 mg/dL   BUN 17 7 - 25 mg/dL   Creat 1.02 (H) 0.60 - 0.93 mg/dL   Total Bilirubin 0.6 0.2 - 1.2 mg/dL   Alkaline Phosphatase 93 33 -  130 U/L   AST 23 10 - 35 U/L   ALT 30 (H) 6 - 29 U/L   Total Protein 7.0 6.1 - 8.1 g/dL   Albumin 4.3 3.6 - 5.1 g/dL   Calcium 10.0 8.6 - 10.4 mg/dL   GFR, Est African American 64 >=60 mL/min   GFR, Est Non African  American 55 (L) >=60 mL/min      Assessment & Plan:   Problem List Items Addressed This Visit      Cardiovascular and Mediastinum   Hypertension - Primary (Chronic)    Controlled today; monitor at home if she decides to take the black seed oil, as that can drop BP        Genitourinary   CKD (chronic kidney disease) stage 2, GFR 60-89 ml/min (Chronic)    Avoid NSAIDs, stay hydrated        Other   Obesity (BMI 30.0-34.9)    Discussed healthy weight targets; lose gradually and safely; see AVS      Dyslipidemia (Chronic)    Check lipids today, avoid saturated fats      Relevant Orders   Lipid panel    Other Visit Diagnoses    Hyperglycemia       Relevant Orders   Hemoglobin A1c       Follow up plan: Return in about 3 months (around 01/26/2017) for follow-up visit with Dr. Sanda Klein.  An after-visit summary was printed and given to the patient at [facility].  Please see the patient instructions which may contain other information and recommendations beyond what is mentioned above in the assessment and plan.  No orders of the defined types were placed in this encounter.   Orders Placed This Encounter  Procedures  . Hemoglobin A1c  . Lipid panel

## 2016-10-26 NOTE — Assessment & Plan Note (Signed)
Discussed healthy weight targets; lose gradually and safely; see AVS

## 2016-10-26 NOTE — Assessment & Plan Note (Signed)
Controlled today; monitor at home if she decides to take the black seed oil, as that can drop BP

## 2016-10-26 NOTE — Patient Instructions (Addendum)
Just call me with any problems if you decide to take the herbal supplement If you need something for aches or pains, try to use Tylenol (acetaminophen) instead of non-steroidals (which include Aleve, ibuprofen, Advil, Motrin, and naproxen); non-steroidals can cause long-term kidney damage Try to drink 64 ounces of water through the day The highest weight for your height that is considered normal or healthy is 164 pounds Check out the information at familydoctor.org entitled "Nutrition for Weight Loss: What You Need to Know about Fad Diets" Try to lose between 1-2 pounds per week by taking in fewer calories and burning off more calories You can succeed by limiting portions, limiting foods dense in calories and fat, becoming more active, and drinking 8 glasses of water a day (64 ounces) Don't skip meals, especially breakfast, as skipping meals may alter your metabolism Do not use over-the-counter weight loss pills or gimmicks that claim rapid weight loss A healthy BMI (or body mass index) is between 18.5 and 24.9 You can calculate your ideal BMI at the NIH website [employer].huhttp://www.nhlbi.nih.gov/health/educational/lose_wt/BMI/bmicalc.htm Let's see you back in 3 months and we'll aim for ten pounds of weight loss by then

## 2016-10-26 NOTE — Assessment & Plan Note (Signed)
Check lipids today, avoid saturated fats 

## 2016-10-27 LAB — LIPID PANEL
Cholesterol: 166 mg/dL
HDL: 51 mg/dL
LDL Cholesterol: 76 mg/dL
Total CHOL/HDL Ratio: 3.3 ratio
Triglycerides: 197 mg/dL — ABNORMAL HIGH
VLDL: 39 mg/dL — ABNORMAL HIGH

## 2016-10-27 LAB — HEMOGLOBIN A1C
Hgb A1c MFr Bld: 5.4 %
Mean Plasma Glucose: 108 mg/dL

## 2016-11-09 ENCOUNTER — Other Ambulatory Visit: Payer: Self-pay | Admitting: Family Medicine

## 2016-11-09 DIAGNOSIS — G5793 Unspecified mononeuropathy of bilateral lower limbs: Secondary | ICD-10-CM

## 2016-12-03 DIAGNOSIS — R69 Illness, unspecified: Secondary | ICD-10-CM | POA: Diagnosis not present

## 2016-12-07 ENCOUNTER — Other Ambulatory Visit: Payer: Self-pay | Admitting: Family Medicine

## 2016-12-07 ENCOUNTER — Telehealth: Payer: Self-pay | Admitting: Family Medicine

## 2016-12-07 DIAGNOSIS — Z1231 Encounter for screening mammogram for malignant neoplasm of breast: Secondary | ICD-10-CM

## 2016-12-07 DIAGNOSIS — G5793 Unspecified mononeuropathy of bilateral lower limbs: Secondary | ICD-10-CM

## 2016-12-07 NOTE — Telephone Encounter (Signed)
Called the pt informed her she has 5 refills remanding. Any time she needs a refill call it in instead of going putting in the RX number that's on the bottle or ask the pharmacist to put it on automatic refill so she want have to keep calling them for a refill. Pt understood.

## 2016-12-07 NOTE — Telephone Encounter (Signed)
[facility] [address]

## 2017-01-02 DIAGNOSIS — R69 Illness, unspecified: Secondary | ICD-10-CM | POA: Diagnosis not present

## 2017-01-18 ENCOUNTER — Ambulatory Visit
Admission: RE | Admit: 2017-01-18 | Discharge: 2017-01-18 | Disposition: A | Discharge status: Home / Self Care | Payer: Medicare HMO | Source: Ambulatory Visit | Attending: Family Medicine | Admitting: Family Medicine

## 2017-01-18 DIAGNOSIS — Z1231 Encounter for screening mammogram for malignant neoplasm of breast: Secondary | ICD-10-CM | POA: Insufficient documentation

## 2017-01-19 ENCOUNTER — Encounter: Payer: Self-pay | Admitting: Family Medicine

## 2017-01-19 ENCOUNTER — Ambulatory Visit (INDEPENDENT_AMBULATORY_CARE_PROVIDER_SITE_OTHER): Payer: Medicare HMO | Admitting: Family Medicine

## 2017-01-19 VITALS — BP 122/82 | HR 78 | Temp 97.9°F | Resp 16 | Ht 68.0 in | Wt 208.4 lb

## 2017-01-19 DIAGNOSIS — E785 Hyperlipidemia, unspecified: Secondary | ICD-10-CM

## 2017-01-19 DIAGNOSIS — E669 Obesity, unspecified: Secondary | ICD-10-CM

## 2017-01-19 DIAGNOSIS — Z5181 Encounter for therapeutic drug level monitoring: Secondary | ICD-10-CM

## 2017-01-19 DIAGNOSIS — I10 Essential (primary) hypertension: Secondary | ICD-10-CM

## 2017-01-19 DIAGNOSIS — E66811 Obesity, class 1: Secondary | ICD-10-CM

## 2017-01-19 DIAGNOSIS — N182 Chronic kidney disease, stage 2 (mild): Secondary | ICD-10-CM

## 2017-01-19 DIAGNOSIS — Z23 Encounter for immunization: Secondary | ICD-10-CM | POA: Diagnosis not present

## 2017-01-19 DIAGNOSIS — K219 Gastro-esophageal reflux disease without esophagitis: Secondary | ICD-10-CM

## 2017-01-19 MED ORDER — DULOXETINE HCL 30 MG PO CPEP: 30.0000 mg | ORAL_CAPSULE | Freq: Every day | ORAL | 3 refills | Status: DC

## 2017-01-19 NOTE — Assessment & Plan Note (Signed)
Avoid NSAID, stay hdyrated

## 2017-01-19 NOTE — Assessment & Plan Note (Signed)
No red flags; occasional H2 blocker; avoiding triggers

## 2017-01-19 NOTE — Assessment & Plan Note (Signed)
Encouraged weight loss; see AVS 

## 2017-01-19 NOTE — Assessment & Plan Note (Addendum)
Check liver and kidneys in Jan

## 2017-01-19 NOTE — Assessment & Plan Note (Signed)
Excellent control.   

## 2017-01-19 NOTE — Progress Notes (Addendum)
BP 122/82   Pulse 78   Temp 97.9 F (36.6 C) (Oral)   Resp 16   Ht  (1.727 m)   Wt 208 lb 6.4 oz (94.5 kg)   LMP  (LMP Unknown)   SpO2 95%   BMI 31.69 kg/m    Subjective:    Patient ID: Dana Peters, female    DOB: [birth year], 71 y.o.   MRN: [id number]  HPI: Dana Peters is a 71 y.o. female  Chief Complaint  Patient presents with  . Follow-up  . Hot Flashes    HPI Patient is here for f/u Since last visit, no medical excitement Obesity; frustrated at not losing weight; tried low carb, tried exercising a little bit, not as much as she could; trying to drink water, trying to limit sweets CKD; avoiding NSAIDs; trying to drink enough water Hyperlipidemia; last lipids reviewed; avoiding fatty processed meats Lab Results  Component Value Date   CHOL 166 10/26/2016   HDL 51 10/26/2016   LDLCALC 76 10/26/2016   TRIG 197 (H) 10/26/2016   CHOLHDL 3.3 10/26/2016  acid reflux; "it's been preetty good"; avoiding triggers; not taking pills any more except for rare H2 blocker Duloxetine is 20 mg daily; sweating more on the 20 mg instead of the 30 mg Allergies are controlled with the medicines  Depression screen [facility] 2/9 01/19/2017 10/26/2016 06/15/2016 05/25/2016 02/14/2016  Decreased Interest 0 0 0 0 0  Down, Depressed, Hopeless 0 0 0 1 0  PHQ - 2 Score 0 0 0 1 0    Relevant past medical, surgical, family and social history reviewed Past Medical History:  Diagnosis Date  . Allergy   . CKD (chronic kidney disease) stage 2, GFR 60-89 ml/min   . Dyslipidemia   . GERD (gastroesophageal reflux disease)   . Hypertension   . Lumbar spinal stenosis   . Neuropathy    both feet  . Obesity (BMI 30.0-34.9) 05/29/2016  . Osteopenia 06/15/2016   March 2018  . Personal history of tobacco use, presenting hazards to health 07/14/2015  . Reflux   . Sciatica   . Thoracic aortic atherosclerosis ([facility]) 08/01/2016   Chest CT  . Venous stasis   . Vitamin D deficiency disease     Past Surgical History:  Procedure Laterality Date  . ABDOMINAL HYSTERECTOMY     complete, due to heavy  . CATARACT EXTRACTION, BILATERAL    . ECTOPIC PREGNANCY SURGERY     Family History  Problem Relation Age of Onset  . Cancer Mother        colon  . Hypertension Mother   . Alzheimer's disease Mother   . Cancer Father        prostate  . Cancer Brother        lung  . Stroke Brother   . Cancer Brother        prostate and brain  . Cancer Brother        throat  . Diabetes Paternal Aunt   . Breast cancer Paternal Aunt   . Healthy Daughter   . Cirrhosis Brother    Social History   Social History  . Marital status: Married    Spouse name: N/A  . Number of children: N/A  . Years of education: N/A   Occupational History  . Not on file.   Social History Main Topics  . Smoking status: Former Smoker    Packs/day: 1.00    Years: 30.00    Types:  Cigarettes    Quit date: 10/13/2005  . Smokeless tobacco: Former Neurosurgeon    Types: Snuff    Quit date: 10/09/2011  . Alcohol use No  . Drug use: No  . Sexual activity: Yes   Other Topics Concern  . Not on file   Social History Narrative   Lives with husband in a one story home.  Has one daughter.     Retired from [employer] work.     Education: high school.   Interim medical history since last visit reviewed. Allergies and medications reviewed  Review of Systems  Respiratory: Negative for shortness of breath.   Cardiovascular: Negative for chest pain.   Per HPI unless specifically indicated above     Objective:    BP 122/82   Pulse 78   Temp 97.9 F (36.6 C) (Oral)   Resp 16   Ht  (1.727 m)   Wt 208 lb 6.4 oz (94.5 kg)   LMP  (LMP Unknown)   SpO2 95%   BMI 31.69 kg/m   Wt Readings from Last 3 Encounters:  01/19/17 208 lb 6.4 oz (94.5 kg)  10/26/16 209 lb 9.6 oz (95.1 kg)  10/05/16 209 lb 8 oz (95 kg)    Physical Exam  Constitutional: She appears well-developed and well-nourished. No distress.  HENT:   Head: Normocephalic and atraumatic.  Eyes: EOM are normal. No scleral icterus.  Neck: No thyromegaly present.  Cardiovascular: Normal rate, regular rhythm and normal heart sounds.   No murmur heard. Pulmonary/Chest: Effort normal and breath sounds normal. No respiratory distress. She has no wheezes.  Abdominal: Soft. Bowel sounds are normal. She exhibits no distension.  Musculoskeletal: Normal range of motion. She exhibits no edema.  Neurological: She is alert. She exhibits normal muscle tone.  Skin: Skin is warm and dry. She is not diaphoretic. No pallor.  Psychiatric: She has a normal mood and affect. Her behavior is normal. Judgment and thought content normal.   Results for orders placed or performed in visit on 10/26/16  Hemoglobin A1c  Result Value Ref Range   Hgb A1c MFr Bld 5.4 <5.7 %   Mean Plasma Glucose 108 mg/dL  Lipid panel  Result Value Ref Range   Cholesterol 166 <200 mg/dL   Triglycerides 161 (H) <150 mg/dL   HDL 51 >09 mg/dL   Total CHOL/HDL Ratio 3.3 <5.0 Ratio   VLDL 39 (H) <30 mg/dL   LDL Cholesterol 76 <604 mg/dL      Assessment & Plan:   Problem List Items Addressed This Visit      Cardiovascular and Mediastinum   Hypertension (Chronic)    Excellent control        Digestive   Acid reflux (Chronic)    No red flags; occasional H2 blocker; avoiding triggers        Genitourinary   CKD (chronic kidney disease) stage 2, GFR 60-89 ml/min - Primary (Chronic)    Avoid NSAID, stay hdyrated        Other   Obesity (BMI 30.0-34.9)    Encouraged weight loss; see AVS      Medication monitoring encounter    Check liver and kidneys in Jan      Dyslipidemia (Chronic)    Intolerant to statins; work on weight loss; avoiding processed fatty meats       Other Visit Diagnoses    Needs flu shot       Relevant Orders   Flu vaccine HIGH DOSE PF (Fluzone High dose) (  Completed)      Follow up plan: Return in about 3 months (around 04/30/2017) for twenty  minute follow-up with fasting labs.  An after-visit summary was printed and given to the patient at check-out.  Please see the patient instructions which may contain other information and recommendations beyond what is mentioned above in the assessment and plan.  Meds ordered this encounter  Medications  . DULoxetine (CYMBALTA) 30 MG capsule    Sig: Take 1 capsule (30 mg total) by mouth daily.    Dispense:  30 capsule    Refill:  3    Change dose    Orders Placed This Encounter  Procedures  . Flu vaccine HIGH DOSE PF (Fluzone High dose)

## 2017-01-19 NOTE — Assessment & Plan Note (Signed)
Intolerant to statins; work on weight loss; avoiding processed fatty meats

## 2017-01-19 NOTE — Patient Instructions (Signed)
A healthy weight for your height is 164 pounds or less Check out the information at familydoctor.org entitled "Nutrition for Weight Loss: What You Need to Know about Fad Diets" Try to lose between 1-2 pounds per week by taking in fewer calories and burning off more calories You can succeed by limiting portions, limiting foods dense in calories and fat, becoming more active, and drinking 8 glasses of water a day (64 ounces) Don't skip meals, especially breakfast, as skipping meals may alter your metabolism Do not use over-the-counter weight loss pills or gimmicks that claim rapid weight loss A healthy BMI (or body mass index) is between 18.5 and 24.9 You can calculate your ideal BMI at the NIH website [employer].hu

## 2017-01-31 ENCOUNTER — Telehealth: Payer: Self-pay | Admitting: Family Medicine

## 2017-01-31 NOTE — Telephone Encounter (Signed)
We received a note from the patient's pharmacy Please let her know that her insurance is not going to cover the brand name Zetia in 2019; we have to document a previous trial and failure of the generic ezetimibe; please find out when she took the generic, for how long, and what her reaction was so we can have that in the chart in case we need to do an appeal Thank you

## 2017-01-31 NOTE — Telephone Encounter (Signed)
Called pt, inquired about ezetimibe, pt states that she is unable to remember if she has ever taken this and if she had any reaction when and if she took it. Also of note pt states that she may be switching insurance companies as she is looking into her options now. Made pt aware that brand name would not be covered by current carrier next year. Pt gave verbal understanding.

## 2017-02-14 ENCOUNTER — Telehealth: Payer: Self-pay

## 2017-02-14 MED ORDER — EZETIMIBE 10 MG PO TABS: 10.0000 mg | ORAL_TABLET | Freq: Every day | ORAL | 3 refills | Status: DC

## 2017-02-14 NOTE — Telephone Encounter (Signed)
Copied from CRM #[id number]. Topic: General - Other >> Feb 14, 2017 11:38 AM Cecelia ByarsGreen, Temeka L, RMA wrote: Reason for CRM: patient is requesting a generic brand of Zetia due to the increase of price , pt has spoken with pharmacy and insurance, pt would like a call back [phone]   Please advise.

## 2017-02-14 NOTE — Telephone Encounter (Signed)
Okay for generic New Rx sent

## 2017-02-20 ENCOUNTER — Telehealth: Payer: Self-pay

## 2017-02-20 NOTE — Telephone Encounter (Signed)
Called pt to inquire if she is currently taking duloxetine. Pt states that she is currently taking this medication, pt states that she was finishing the bottle of previous dosage before starting new one which is why she has not needed refills recently. Pt states that she did this due to cost. She is aware she needs to start new dosage when this bottle is finished.

## 2017-03-26 DIAGNOSIS — Z961 Presence of intraocular lens: Secondary | ICD-10-CM | POA: Diagnosis not present

## 2017-03-26 DIAGNOSIS — H26493 Other secondary cataract, bilateral: Secondary | ICD-10-CM | POA: Diagnosis not present

## 2017-03-30 DIAGNOSIS — Z972 Presence of dental prosthetic device (complete) (partial): Secondary | ICD-10-CM | POA: Diagnosis not present

## 2017-03-30 DIAGNOSIS — G629 Polyneuropathy, unspecified: Secondary | ICD-10-CM | POA: Diagnosis not present

## 2017-03-30 DIAGNOSIS — Z0000 Encounter for general adult medical examination without abnormal findings: Secondary | ICD-10-CM | POA: Diagnosis not present

## 2017-03-30 DIAGNOSIS — Z87891 Personal history of nicotine dependence: Secondary | ICD-10-CM | POA: Diagnosis not present

## 2017-03-30 DIAGNOSIS — E782 Mixed hyperlipidemia: Secondary | ICD-10-CM | POA: Diagnosis not present

## 2017-03-30 DIAGNOSIS — Z6830 Body mass index (BMI) 30.0-30.9, adult: Secondary | ICD-10-CM | POA: Diagnosis not present

## 2017-03-30 DIAGNOSIS — Z7982 Long term (current) use of aspirin: Secondary | ICD-10-CM | POA: Diagnosis not present

## 2017-03-30 DIAGNOSIS — K219 Gastro-esophageal reflux disease without esophagitis: Secondary | ICD-10-CM | POA: Diagnosis not present

## 2017-03-30 DIAGNOSIS — E669 Obesity, unspecified: Secondary | ICD-10-CM | POA: Diagnosis not present

## 2017-03-30 DIAGNOSIS — K08109 Complete loss of teeth, unspecified cause, unspecified class: Secondary | ICD-10-CM | POA: Diagnosis not present

## 2017-05-27 NOTE — Progress Notes (Signed)
Closing out lab/order note open since:  June 2018 

## 2017-05-27 NOTE — Progress Notes (Signed)
Signing off on old abstraction 

## 2017-05-27 NOTE — Progress Notes (Signed)
Closing out lab/order note open since:  Aug 2018 

## 2017-05-28 ENCOUNTER — Ambulatory Visit
Admission: EM | Admit: 2017-05-28 | Discharge: 2017-05-28 | Disposition: A | Discharge status: Home / Self Care | Payer: Medicare HMO | Attending: Internal Medicine | Admitting: Internal Medicine

## 2017-05-28 ENCOUNTER — Other Ambulatory Visit: Payer: Self-pay

## 2017-05-28 ENCOUNTER — Ambulatory Visit
Admit: 2017-05-28 | Discharge: 2017-05-28 | Disposition: A | Discharge status: Home / Self Care | Payer: Medicare HMO | Attending: Internal Medicine | Admitting: Internal Medicine

## 2017-05-28 ENCOUNTER — Ambulatory Visit: Payer: Medicare HMO | Admitting: Family Medicine

## 2017-05-28 DIAGNOSIS — R2241 Localized swelling, mass and lump, right lower limb: Secondary | ICD-10-CM

## 2017-05-28 DIAGNOSIS — Z885 Allergy status to narcotic agent status: Secondary | ICD-10-CM | POA: Insufficient documentation

## 2017-05-28 DIAGNOSIS — N182 Chronic kidney disease, stage 2 (mild): Secondary | ICD-10-CM | POA: Insufficient documentation

## 2017-05-28 DIAGNOSIS — I129 Hypertensive chronic kidney disease with stage 1 through stage 4 chronic kidney disease, or unspecified chronic kidney disease: Secondary | ICD-10-CM | POA: Diagnosis not present

## 2017-05-28 DIAGNOSIS — K219 Gastro-esophageal reflux disease without esophagitis: Secondary | ICD-10-CM | POA: Insufficient documentation

## 2017-05-28 DIAGNOSIS — Z880 Allergy status to penicillin: Secondary | ICD-10-CM | POA: Diagnosis not present

## 2017-05-28 DIAGNOSIS — M7989 Other specified soft tissue disorders: Secondary | ICD-10-CM | POA: Diagnosis present

## 2017-05-28 DIAGNOSIS — I739 Peripheral vascular disease, unspecified: Secondary | ICD-10-CM | POA: Diagnosis not present

## 2017-05-28 DIAGNOSIS — E785 Hyperlipidemia, unspecified: Secondary | ICD-10-CM | POA: Diagnosis not present

## 2017-05-28 DIAGNOSIS — E559 Vitamin D deficiency, unspecified: Secondary | ICD-10-CM | POA: Insufficient documentation

## 2017-05-28 DIAGNOSIS — Z6829 Body mass index (BMI) 29.0-29.9, adult: Secondary | ICD-10-CM | POA: Diagnosis not present

## 2017-05-28 DIAGNOSIS — Z79899 Other long term (current) drug therapy: Secondary | ICD-10-CM | POA: Insufficient documentation

## 2017-05-28 DIAGNOSIS — R600 Localized edema: Secondary | ICD-10-CM | POA: Insufficient documentation

## 2017-05-28 DIAGNOSIS — I2510 Atherosclerotic heart disease of native coronary artery without angina pectoris: Secondary | ICD-10-CM | POA: Insufficient documentation

## 2017-05-28 DIAGNOSIS — Z886 Allergy status to analgesic agent status: Secondary | ICD-10-CM | POA: Insufficient documentation

## 2017-05-28 DIAGNOSIS — G629 Polyneuropathy, unspecified: Secondary | ICD-10-CM | POA: Insufficient documentation

## 2017-05-28 DIAGNOSIS — E669 Obesity, unspecified: Secondary | ICD-10-CM | POA: Insufficient documentation

## 2017-05-28 DIAGNOSIS — M8580 Other specified disorders of bone density and structure, unspecified site: Secondary | ICD-10-CM | POA: Diagnosis not present

## 2017-05-28 DIAGNOSIS — Z7982 Long term (current) use of aspirin: Secondary | ICD-10-CM | POA: Insufficient documentation

## 2017-05-28 DIAGNOSIS — Z87891 Personal history of nicotine dependence: Secondary | ICD-10-CM | POA: Diagnosis not present

## 2017-05-28 LAB — CBC WITH DIFFERENTIAL/PLATELET
Basophils Absolute: 0.1 10*3/uL (ref 0–0.1)
Basophils Relative: 1 %
Eosinophils Absolute: 0.2 10*3/uL (ref 0–0.7)
Eosinophils Relative: 3 %
HCT: 39.7 % (ref 35.0–47.0)
Hemoglobin: 13.7 g/dL (ref 12.0–16.0)
Lymphocytes Relative: 31 %
Lymphs Abs: 2 10*3/uL (ref 1.0–3.6)
MCH: 32.3 pg (ref 26.0–34.0)
MCHC: 34.5 g/dL (ref 32.0–36.0)
MCV: 93.6 fL (ref 80.0–100.0)
Monocytes Absolute: 0.5 10*3/uL (ref 0.2–0.9)
Monocytes Relative: 7 %
Neutro Abs: 3.7 10*3/uL (ref 1.4–6.5)
Neutrophils Relative %: 58 %
Platelets: 205 10*3/uL (ref 150–440)
RBC: 4.24 MIL/uL (ref 3.80–5.20)
RDW: 12.7 % (ref 11.5–14.5)
WBC: 6.3 10*3/uL (ref 3.6–11.0)

## 2017-05-28 LAB — COMPREHENSIVE METABOLIC PANEL WITH GFR
ALT: 18 U/L (ref 14–54)
AST: 19 U/L (ref 15–41)
Albumin: 4.1 g/dL (ref 3.5–5.0)
Alkaline Phosphatase: 80 U/L (ref 38–126)
Anion gap: 6 (ref 5–15)
BUN: 15 mg/dL (ref 6–20)
CO2: 29 mmol/L (ref 22–32)
Calcium: 9.3 mg/dL (ref 8.9–10.3)
Chloride: 102 mmol/L (ref 101–111)
Creatinine, Ser: 0.84 mg/dL (ref 0.44–1.00)
GFR calc Af Amer: >60 mL/min
GFR calc non Af Amer: >60 mL/min
Glucose, Bld: 107 mg/dL — ABNORMAL HIGH (ref 65–99)
Potassium: 4.5 mmol/L (ref 3.5–5.1)
Sodium: 137 mmol/L (ref 135–145)
Total Bilirubin: 0.7 mg/dL (ref 0.3–1.2)
Total Protein: 7.8 g/dL (ref 6.5–8.1)

## 2017-05-28 NOTE — Discharge Instructions (Addendum)
Ultrasound of the right leg today was negative for DVT (no blood clot seen).  Lab work was unremarkable (blood counts, chemistries/liver/kidney tests).  Would resume usual activity.  Elevate leg at the end of the day for 10-20 minutes to relieve swelling, and as needed.  Recheck or followup with Dr Sherie DonLada if swelling becomes much worse, to reassess.

## 2017-05-28 NOTE — ED Provider Notes (Signed)
[facility]    CSN: [id number] Arrival date & time: 05/28/17  0945     History   Chief Complaint Chief Complaint  Patient presents with  . Leg Swelling    HPI Dana Peters is a 72 y.o. female.   She presents today with onset 3-4 days ago of right lower leg swelling, this extends from the foot/ankle to up above the knee.  No redness, not painful.  No injury, no unusual activities.  She is not taking a hormone, and has no history of cancer or recent immobilization.  No family or personal history of blood clot.  She says that she does have a kidney disorder, and wonders if this is causing the swelling.    HPI  Past Medical History:  Diagnosis Date  . Allergy   . CKD (chronic kidney disease) stage 2, GFR 60-89 ml/min   . Dyslipidemia   . GERD (gastroesophageal reflux disease)   . Hypertension   . Lumbar spinal stenosis   . Neuropathy    both feet  . Obesity (BMI 30.0-34.9) 05/29/2016  . Osteopenia 06/15/2016   March 2018  . Personal history of tobacco use, presenting hazards to health 07/14/2015  . Reflux   . Sciatica   . Thoracic aortic atherosclerosis (HCC) 08/01/2016   Chest CT  . Venous stasis   . Vitamin D deficiency disease     Patient Active Problem List   Diagnosis Date Noted  . Thoracic aortic atherosclerosis (HCC) 08/01/2016  . Osteopenia 06/15/2016  . Obesity (BMI 30.0-34.9) 05/29/2016  . Urinary hesitancy 02/14/2016  . PAD (peripheral artery disease) (HCC) 09/09/2015  . Coronary artery calcification seen on CT scan 07/20/2015  . Personal history of tobacco use, presenting hazards to health 07/14/2015  . Preventative health care 06/05/2015  . Medication monitoring encounter 05/07/2015  . Breast cancer screening 03/12/2015  . Foraminal stenosis of lumbar region 01/28/2015  . Facet arthropathy, lumbar 01/28/2015  . Spinal stenosis of lumbar region 01/28/2015  . Neuropathy of both feet 01/07/2015  . Leg pain, bilateral 01/07/2015  . Leg  heaviness 01/07/2015  . CKD (chronic kidney disease) stage 2, GFR 60-89 ml/min 10/17/2014  . Allergic rhinitis 10/14/2014  . Allergic conjunctivitis 10/14/2014  . Dyslipidemia 10/14/2014  . Hypertension   . Sciatica   . Vitamin D deficiency disease   . Acid reflux 02/02/2014  . Neuralgia neuritis, sciatic nerve 08/28/2012    Past Surgical History:  Procedure Laterality Date  . ABDOMINAL HYSTERECTOMY     complete, due to heavy  . CATARACT EXTRACTION, BILATERAL    . ECTOPIC PREGNANCY SURGERY       Home Medications    Prior to Admission medications   Medication Sig Start Date End Date Taking? Authorizing Provider  aspirin EC 81 MG tablet Take 1 tablet (81 mg total) by mouth daily. 05/24/15   Kerman Passey, MD  Cholecalciferol (VITAMIN D) 2000 units CAPS Take by mouth daily.    [provider]  DULoxetine (CYMBALTA) 30 MG capsule Take 1 capsule (30 mg total) by mouth daily. 01/19/17   Kerman Passey, MD  ezetimibe (ZETIA) 10 MG tablet Take 1 tablet (10 mg total) daily by mouth. 02/14/17   Kerman Passey, MD  loratadine (CLARITIN) 10 MG tablet Take 1 tablet (10 mg total) by mouth daily as needed. 08/23/16   Doren Custard, FNP  mometasone (NASONEX) 50 MCG/ACT nasal spray Place 2 sprays into the nose as needed.  [provider]  montelukast (SINGULAIR) 10 MG tablet Take 1 tablet (10 mg total) by mouth daily as needed. 08/23/16   Doren CustardBoyce, Emily E, FNP  Spacer/Aero-Holding Chambers (AEROCHAMBER PLUS) inhaler Use as instructed 06/24/16   Domenick GongMortenson, Ashley, MD    Family History Family History  Problem Relation Age of Onset  . Cancer Mother        colon  . Hypertension Mother   . Alzheimer's disease Mother   . Cancer Father        prostate  . Cancer Brother        lung  . Stroke Brother   . Cancer Brother        prostate and brain  . Cancer Brother        throat  . Diabetes Paternal Aunt   . Breast cancer Paternal Aunt   . Healthy Daughter   . Cirrhosis  Brother     Social History Social History   Tobacco Use  . Smoking status: Former Smoker    Packs/day: 1.00    Years: 30.00    Pack years: 30.00    Types: Cigarettes    Last attempt to quit: 10/13/2005    Years since quitting: 11.6  . Smokeless tobacco: Former NeurosurgeonUser    Types: Snuff    Quit date: 10/09/2011  Substance Use Topics  . Alcohol use: No    Alcohol/week: 0.0 oz  . Drug use: No     Allergies   Atorvastatin; Codeine sulfate; Pravastatin; Protonix [pantoprazole sodium]; Tylenol with codeine #3  [acetaminophen-codeine]; and Penicillin g   Review of Systems Review of Systems  All other systems reviewed and are negative.    Physical Exam Triage Vital Signs ED Triage Vitals  Enc Vitals Group     BP 05/28/17 1014 (!) 142/74     Pulse Rate 05/28/17 1014 60     Resp 05/28/17 1014 18     Temp 05/28/17 1014 97.7 F (36.5 C)     Temp Source 05/28/17 1014 Oral     SpO2 05/28/17 1014 98 %     Weight 05/28/17 1016 194 lb (88 kg)     Height 05/28/17 1016 5' 8.5" (1.74 m)     Pain Score --      Pain Loc --    Updated Vital Signs BP (!) 142/74 (BP Location: Left Arm)   Pulse 60   Temp 97.7 F (36.5 C) (Oral)   Resp 18   Ht 5' 8.5" (1.74 m)   Wt 194 lb (88 kg)   LMP  (LMP Unknown)   SpO2 98%   BMI 29.07 kg/m   Physical Exam  Constitutional: She is oriented to person, place, and time. No distress.  Alert, nicely groomed  HENT:  Head: Atraumatic.  Eyes:  Conjugate gaze, no eye redness/drainage  Neck: Neck supple.  Cardiovascular: Normal rate.  Pulmonary/Chest: No respiratory distress.  Lungs clear, symmetric breath sounds  Abdominal: She exhibits no distension.  Musculoskeletal: Normal range of motion.  Diffuse pitting edema of the bilateral lower extremities extending to just above the knee, significantly greater on the right than on the left.  The right knee is slightly warm to palpation compared to the left, no other temperature differences appreciated.   No erythema, no bruising, no rash. R calf 15 1/4", L calf 14 3/4"; R ankle 10 1/8", L ankle 9 1/4".  Neurological: She is alert and oriented to person, place, and time.  Skin: Skin is warm and dry.  No cyanosis  Nursing note and vitals reviewed.    UC Treatments / Results  Labs Results for orders placed or performed during the hospital encounter of 05/28/17  Comprehensive metabolic panel  Result Value Ref Range   Sodium 137 135 - 145 mmol/L   Potassium 4.5 3.5 - 5.1 mmol/L   Chloride 102 101 - 111 mmol/L   CO2 29 22 - 32 mmol/L   Glucose, Bld 107 (H) 65 - 99 mg/dL   BUN 15 6 - 20 mg/dL   Creatinine, Ser 1.61 0.44 - 1.00 mg/dL   Calcium 9.3 8.9 - 09.6 mg/dL   Total Protein 7.8 6.5 - 8.1 g/dL   Albumin 4.1 3.5 - 5.0 g/dL   AST 19 15 - 41 U/L   ALT 18 14 - 54 U/L   Alkaline Phosphatase 80 38 - 126 U/L   Total Bilirubin 0.7 0.3 - 1.2 mg/dL   GFR calc non Af Amer >60 >60 mL/min   GFR calc Af Amer >60 >60 mL/min   Anion gap 6 5 - 15  CBC with Differential  Result Value Ref Range   WBC 6.3 3.6 - 11.0 K/uL   RBC 4.24 3.80 - 5.20 MIL/uL   Hemoglobin 13.7 12.0 - 16.0 g/dL   HCT 04.5 40.9 - 81.1 %   MCV 93.6 80.0 - 100.0 fL   MCH 32.3 26.0 - 34.0 pg   MCHC 34.5 32.0 - 36.0 g/dL   RDW 91.4 78.2 - 95.6 %   Platelets 205 150 - 440 K/uL   Neutrophils Relative % 58 %   Neutro Abs 3.7 1.4 - 6.5 K/uL   Lymphocytes Relative 31 %   Lymphs Abs 2.0 1.0 - 3.6 K/uL   Monocytes Relative 7 %   Monocytes Absolute 0.5 0.2 - 0.9 K/uL   Eosinophils Relative 3 %   Eosinophils Absolute 0.2 0 - 0.7 K/uL   Basophils Relative 1 %   Basophils Absolute 0.1 0 - 0.1 K/uL    Procedures Procedures (including critical care time) CLINICAL DATA:  Right lower extremity edema for the past 4 days. Evaluate for DVT.  EXAM: RIGHT LOWER EXTREMITY VENOUS DOPPLER ULTRASOUND  TECHNIQUE: Gray-scale sonography with graded compression, as well as color Doppler and duplex ultrasound were performed to  evaluate the lower extremity deep venous systems from the level of the common femoral vein and including the common femoral, femoral, profunda femoral, popliteal and calf veins including the posterior tibial, peroneal and gastrocnemius veins when visible. The superficial great saphenous vein was also interrogated. Spectral Doppler was utilized to evaluate flow at rest and with distal augmentation maneuvers in the common femoral, femoral and popliteal veins.  COMPARISON:  None.  FINDINGS: Contralateral Common Femoral Vein: Respiratory phasicity is normal and symmetric with the symptomatic side. No evidence of thrombus. Normal compressibility.  Common Femoral Vein: No evidence of thrombus. Normal compressibility, respiratory phasicity and response to augmentation.  Saphenofemoral Junction: No evidence of thrombus. Normal compressibility and flow on color Doppler imaging.  Profunda Femoral Vein: No evidence of thrombus. Normal compressibility and flow on color Doppler imaging.  Femoral Vein: No evidence of thrombus. Normal compressibility, respiratory phasicity and response to augmentation.  Popliteal Vein: No evidence of thrombus. Normal compressibility, respiratory phasicity and response to augmentation.  Calf Veins: No evidence of thrombus. Normal compressibility and flow on color Doppler imaging.  Superficial Great Saphenous Vein: No evidence of thrombus. Normal compressibility.  Venous Reflux:  None.  Other Findings: Note is made of a  benign appearing non pathologically enlarged right inguinal lymph node which measures approximately 0.6 cm in greatest short axis diameter and maintains a benign fatty hilum (image 41). Note is made of a approximately 1.9 x 5.6 x 1.0 cm serpiginous apparent fluid collection with the right popliteal fossa favored to represent a Baker cyst.  IMPRESSION: No evidence of DVT within the right lower extremity.   Electronically  Signed   By: Simonne Come M.D.   On: 05/28/2017 13:44   Final Clinical Impressions(s) / UC Diagnoses   Final diagnoses:  Edema of right lower extremity   Ultrasound of the right leg today was negative for DVT (no blood clot seen).  Lab work was unremarkable (blood counts, chemistries/liver/kidney tests).  Would resume usual activity.  Elevate leg at the end of the day for 10-20 minutes to relieve swelling, and as needed. Not painful and patient reports hx renal insufficiency, so did not pursue an anti inflammatory.  Recheck or followup with Dr Sherie Don if swelling becomes much worse, to reassess and discuss other treatment options.  Dayton Scrape, Renie Ora, MD 05/30/17 1320

## 2017-05-28 NOTE — ED Triage Notes (Signed)
3 days of right lower leg and ankle swelling. Denies pain but gets more swollen throughout the day. Denies injury

## 2017-05-29 DIAGNOSIS — R69 Illness, unspecified: Secondary | ICD-10-CM | POA: Diagnosis not present

## 2017-06-04 ENCOUNTER — Ambulatory Visit (INDEPENDENT_AMBULATORY_CARE_PROVIDER_SITE_OTHER): Payer: Medicare HMO | Admitting: Family Medicine

## 2017-06-04 ENCOUNTER — Encounter: Payer: Self-pay | Admitting: Family Medicine

## 2017-06-04 VITALS — BP 122/82 | HR 89 | Temp 98.2°F | Resp 14 | Wt 195.6 lb

## 2017-06-04 DIAGNOSIS — R82998 Other abnormal findings in urine: Secondary | ICD-10-CM

## 2017-06-04 DIAGNOSIS — M7121 Synovial cyst of popliteal space [Baker], right knee: Secondary | ICD-10-CM | POA: Insufficient documentation

## 2017-06-04 DIAGNOSIS — H6123 Impacted cerumen, bilateral: Secondary | ICD-10-CM | POA: Diagnosis not present

## 2017-06-04 HISTORY — DX: Synovial cyst of popliteal space (Baker), right knee: M71.21

## 2017-06-04 LAB — POCT URINALYSIS DIPSTICK
Bilirubin, UA: NEGATIVE
Blood, UA: NEGATIVE
Glucose, UA: NEGATIVE
Ketones, UA: NEGATIVE
Leukocytes, UA: NEGATIVE
Nitrite, UA: NEGATIVE
Odor: NORMAL
Protein, UA: NEGATIVE
Spec Grav, UA: 1.02
Urobilinogen, UA: 0.2 U/dL
pH, UA: 6

## 2017-06-04 NOTE — Progress Notes (Signed)
BP 122/82   Pulse 89   Temp 98.2 F (36.8 C) (Oral)   Resp 14   Wt 195 lb 9.6 oz (88.7 kg)   LMP  (LMP Unknown)   SpO2 94%   BMI 29.31 kg/m    Subjective:    Patient ID: Dana Peters, female    DOB: [birth year], 72 y.o.   MRN: [id number]  HPI: Dana Peters is a 72 y.o. female  Chief Complaint  Patient presents with  . Leg Swelling    right leg with upper calf pain.  Onset 1 week, went to urgent care had normal labs and normal DVT workup.    HPI Patient is here for evaluation of her leg; she has already been seen at urgent care and had bloodwork and an ultrasound done; notes reviewed Right leg is the same Reviewed [name]: Other Findings: Note is made of a benign appearing non pathologically enlarged right inguinal lymph node which measures approximately 0.6 cm in greatest short axis diameter and maintains a benign fatty hilum (image 41). Note is made of a approximately 1.9 x 5.6 x 1.0 cm serpiginous apparent fluid collection with the right popliteal fossa favored to represent a Baker cyst.  IMPRESSION: No evidence of DVT within the right lower extremity.   Electronically Signed By: Simonne Come M.D. On: 05/28/2017 13:44  Depression screen [facility] 2/9 06/04/2017 01/19/2017 10/26/2016 06/15/2016 05/25/2016  Decreased Interest 0 0 0 0 0  Down, Depressed, Hopeless 0 0 0 0 1  PHQ - 2 Score 0 0 0 0 1    Relevant past medical, surgical, family and social history reviewed Past Medical History:  Diagnosis Date  . Allergy   . Baker's cyst of knee, right 06/04/2017   [name] Feb 2019  . CKD (chronic kidney disease) stage 2, GFR 60-89 ml/min   . Dyslipidemia   . GERD (gastroesophageal reflux disease)   . Hypertension   . Lumbar spinal stenosis   . Neuropathy    both feet  . Obesity (BMI 30.0-34.9) 05/29/2016  . Osteopenia 06/15/2016   March 2018  . Personal history of tobacco use, presenting hazards to health 07/14/2015  . Reflux   . Sciatica   . Thoracic aortic  atherosclerosis (HCC) 08/01/2016   Chest CT  . Venous stasis   . Vitamin D deficiency disease    Past Surgical History:  Procedure Laterality Date  . ABDOMINAL HYSTERECTOMY     complete, due to heavy  . CATARACT EXTRACTION, BILATERAL    . ECTOPIC PREGNANCY SURGERY     Family History  Problem Relation Age of Onset  . Cancer Mother        colon  . Hypertension Mother   . Alzheimer's disease Mother   . Cancer Father        prostate  . Cancer Brother        lung  . Stroke Brother   . Cancer Brother        prostate and brain  . Cancer Brother        throat  . Diabetes Paternal Aunt   . Breast cancer Paternal Aunt   . Healthy Daughter   . Cirrhosis Brother    Social History   Tobacco Use  . Smoking status: Former Smoker    Packs/day: 1.00    Years: 30.00    Pack years: 30.00    Types: Cigarettes    Last attempt to quit: 10/13/2005    Years since quitting: 11.6  .  Smokeless tobacco: Former NeurosurgeonUser    Types: Snuff    Quit date: 10/09/2011  Substance Use Topics  . Alcohol use: No    Alcohol/week: 0.0 oz  . Drug use: No    Interim medical history since last visit reviewed. Allergies and medications reviewed  Review of Systems Per HPI unless specifically indicated above     Objective:    BP 122/82   Pulse 89   Temp 98.2 F (36.8 C) (Oral)   Resp 14   Wt 195 lb 9.6 oz (88.7 kg)   LMP  (LMP Unknown)   SpO2 94%   BMI 29.31 kg/m   Wt Readings from Last 3 Encounters:  06/04/17 195 lb 9.6 oz (88.7 kg)  05/28/17 194 lb (88 kg)  01/19/17 208 lb 6.4 oz (94.5 kg)    Physical Exam  Constitutional: She appears well-developed and well-nourished.  HENT:  Mouth/Throat: Mucous membranes are normal.  Bilateral impacted cerumen; both sides were cleared with curette under direct visualization by provider  Eyes: EOM are normal. No scleral icterus.  Cardiovascular: Normal rate and regular rhythm.  Pulmonary/Chest: Effort normal and breath sounds normal.  Musculoskeletal:        Right knee: She exhibits swelling (fullness posteriorly). She exhibits no effusion.  Lymphadenopathy:       Right: No inguinal adenopathy present.       Left: No inguinal adenopathy present.  Skin:  No erythema or increased warmth of the skin of the leg  Psychiatric: She has a normal mood and affect. Her behavior is normal.       Assessment & Plan:   Problem List Items Addressed This Visit      Musculoskeletal and Integument   Baker's cyst of knee, right    Reviewed US findings; refer to ortho      Relevant Orders   Ambulatory referral to Orthopedic Surgery    Other Visit Diagnoses    Foamy urine    -  Primary   Relevant Orders   POCT urinalysis dipstick (Completed)   Urine Microalbumin w/creat. ratio (Completed)   Bilateral impacted cerumen           Follow up plan: No Follow-up on file.  An after-visit summary was printed and given to the patient at check-out.  Please see the patient instructions which may contain other information and recommendations beyond what is mentioned above in the assessment and plan.  No orders of the defined types were placed in this encounter.   Orders Placed This Encounter  Procedures  . Urine Microalbumin w/creat. ratio  . Ambulatory referral to Orthopedic Surgery  . POCT urinalysis dipstick

## 2017-06-04 NOTE — Patient Instructions (Addendum)
We'll get you to see the orthopaedist If you have not heard anything from my staff in a week about any orders/referrals/studies from today, please contact us here to follow-up [phone] Use ACE wrap, start at the foot and wrap up the leg Unwrap and rewrap every few hours Elevate the leg Avoid salt, no added salt to the food even when cooking  Baker Cyst A Baker cyst, also called a popliteal cyst, is a sac-like growth that forms at the back of the knee. The cyst forms when the fluid-filled sac (bursa) that cushions the knee joint becomes enlarged. The bursa that becomes a Baker cyst is located at the back of the knee joint. What are the causes? In most cases, a Baker cyst results from another knee problem that causes swelling inside the knee. This makes the fluid inside the knee joint (synovial fluid) flow into the bursa behind the knee, causing the bursa to enlarge. What increases the risk? You may be more likely to develop a Baker cyst if you already have a knee problem, such as:  A tear in cartilage that cushions the knee joint (meniscal tear).  A tear in the tissues that connect the bones of the knee joint (ligament tear).  Knee swelling from osteoarthritis, rheumatoid arthritis, or gout.  What are the signs or symptoms? A Baker cyst does not always cause symptoms. A lump behind the knee may be the only sign of the condition. The lump may be painful, especially when the knee is straightened. If the lump is painful, the pain may come and go. The knee may also be stiff. Symptoms may quickly get more severe if the cyst breaks open (ruptures). If your cyst ruptures, signs and symptoms may affect the knee and the back of the lower leg (calf) and may include:  Sudden or worsening pain.  Swelling.  Bruising.  How is this diagnosed? This condition may be diagnosed based on your symptoms and medical history. Your health care provider will also do a physical exam. This may  include:  Feeling the cyst to check whether it is tender.  Checking your knee for signs of another knee condition that causes swelling.  You may have imaging tests, such as:  X-rays.  MRI.  Ultrasound.  How is this treated? A Baker cyst that is not painful may go away without treatment. If the cyst gets large or painful, it will likely get better if the underlying knee problem is treated. Treatment for a Baker cyst may include:  Resting.  Keeping weight off of the knee. This means not leaning on the knee to support your body weight.  NSAIDs to reduce pain and swelling.  A procedure to drain the fluid from the cyst with a needle (aspiration). You may also get an injection of a medicine that reduces swelling (steroid).  Surgery. This may be needed if other treatments do not work. This usually involves correcting knee damage and removing the cyst.  Follow these instructions at home:  Take over-the-counter and prescription medicines only as told by your health care provider.  Rest and return to your normal activities as told by your health care provider. Avoid activities that make pain or swelling worse. Ask your health care provider what activities are safe for you.  Keep all follow-up visits as told by your health care provider. This is important. Contact a health care provider if:  You have knee pain, stiffness, or swelling that does not get better. Get help right away  if:  You have sudden or worsening pain and swelling in your calf area. This information is not intended to replace advice given to you by your health care provider. Make sure you discuss any questions you have with your health care provider. Document Released: 03/27/2005 Document Revised: 12/16/2015 Document Reviewed: 12/16/2015 Elsevier Interactive Patient Education  2018 [employer].

## 2017-06-04 NOTE — Assessment & Plan Note (Signed)
Reviewed US findings; refer to ortho

## 2017-06-05 LAB — MICROALBUMIN / CREATININE URINE RATIO
Creatinine, Urine: 121 mg/dL (ref 20–275)
Microalb Creat Ratio: 3 ug/mg{creat}
Microalb, Ur: 0.4 mg/dL

## 2017-06-12 DIAGNOSIS — M1711 Unilateral primary osteoarthritis, right knee: Secondary | ICD-10-CM | POA: Diagnosis not present

## 2017-06-12 DIAGNOSIS — M179 Osteoarthritis of knee, unspecified: Secondary | ICD-10-CM | POA: Insufficient documentation

## 2017-06-12 DIAGNOSIS — M1710 Unilateral primary osteoarthritis, unspecified knee: Secondary | ICD-10-CM | POA: Insufficient documentation

## 2017-06-25 ENCOUNTER — Encounter: Payer: Self-pay | Admitting: Family Medicine

## 2017-06-25 ENCOUNTER — Ambulatory Visit (INDEPENDENT_AMBULATORY_CARE_PROVIDER_SITE_OTHER): Payer: Medicare HMO | Admitting: Family Medicine

## 2017-06-25 VITALS — BP 134/76 | HR 78 | Temp 98.5°F | Resp 16 | Ht 68.0 in | Wt 193.2 lb

## 2017-06-25 DIAGNOSIS — Z0001 Encounter for general adult medical examination with abnormal findings: Secondary | ICD-10-CM | POA: Diagnosis not present

## 2017-06-25 DIAGNOSIS — M67449 Ganglion, unspecified hand: Secondary | ICD-10-CM | POA: Diagnosis not present

## 2017-06-25 DIAGNOSIS — Z87891 Personal history of nicotine dependence: Secondary | ICD-10-CM

## 2017-06-25 DIAGNOSIS — Z0000 Encounter for general adult medical examination without abnormal findings: Secondary | ICD-10-CM | POA: Insufficient documentation

## 2017-06-25 DIAGNOSIS — Z789 Other specified health status: Secondary | ICD-10-CM | POA: Diagnosis not present

## 2017-06-25 NOTE — Patient Instructions (Addendum)
Consider getting the new shingles vaccine called Shingrix; that is available for individuals 73 years of age and older, and is recommended even if you have had shingles in the past and/or already received the old shingles vaccine (Zostavax); it is a two-part series, and is available at many local pharmacies  Advance Directive Advance directives are legal documents that let you make choices ahead of time about your health care and medical treatment in case you become unable to communicate for yourself. Advance directives are a way for you to communicate your wishes to family, friends, and health care providers. This can help convey your decisions about end-of-life care if you become unable to communicate. Discussing and writing advance directives should happen over time rather than all at once. Advance directives can be changed depending on your situation and what you want, even after you have signed the advance directives. If you do not have an advance directive, some states assign family decision makers to act on your behalf based on how closely you are related to them. Each state has its own laws regarding advance directives. You may want to check with your health care provider, attorney, or state representative about the laws in your state. There are different types of advance directives, such as:  Medical power of attorney.  Living will.  Do not resuscitate (DNR) or do not attempt resuscitation (DNAR) order.  Health care proxy and medical power of attorney A health care proxy, also called a health care agent, is a person who is appointed to make medical decisions for you in cases in which you are unable to make the decisions yourself. Generally, people choose someone they know well and trust to represent their preferences. Make sure to ask this person for an agreement to act as your proxy. A proxy may have to exercise judgment in the event of a medical decision for which your wishes are not  known. A medical power of attorney is a legal document that names your health care proxy. Depending on the laws in your state, after the document is written, it may also need to be:  Signed.  Notarized.  Dated.  Copied.  Witnessed.  Incorporated into your medical record.  You may also want to appoint someone to manage your financial affairs in a situation in which you are unable to do so. This is called a durable power of attorney for finances. It is a separate legal document from the durable power of attorney for health care. You may choose the same person or someone different from your health care proxy to act as your agent in financial matters. If you do not appoint a proxy, or if there is a concern that the proxy is not acting in your best interests, a court-appointed guardian may be designated to act on your behalf. Living will A living will is a set of instructions documenting your wishes about medical care when you cannot express them yourself. Health care providers should keep a copy of your living will in your medical record. You may want to give a copy to family members or friends. To alert caregivers in case of an emergency, you can place a card in your wallet to let them know that you have a living will and where they can find it. A living will is used if you become:  Terminally ill.  Incapacitated.  Unable to communicate or make decisions.  Items to consider in your living will include:  The use or non-use of life-sustaining   equipment, such as dialysis machines and breathing machines (ventilators).  A DNR or DNAR order, which is the instruction not to use cardiopulmonary resuscitation (CPR) if breathing or heartbeat stops.  The use or non-use of tube feeding.  Withholding of food and fluids.  Comfort (palliative) care when the goal becomes comfort rather than a cure.  Organ and tissue donation.  A living will does not give instructions for distributing your money  and property if you should pass away. It is recommended that you seek the advice of a lawyer when writing a will. Decisions about taxes, beneficiaries, and asset distribution will be legally binding. This process can relieve your family and friends of any concerns surrounding disputes or questions that may come up about the distribution of your assets. DNR or DNAR A DNR or DNAR order is a request not to have CPR in the event that your heart stops beating or you stop breathing. If a DNR or DNAR order has not been made and shared, a health care provider will try to help any patient whose heart has stopped or who has stopped breathing. If you plan to have surgery, talk with your health care provider about how your DNR or DNAR order will be followed if problems occur. Summary  Advance directives are the legal documents that allow you to make choices ahead of time about your health care and medical treatment in case you become unable to communicate for yourself.  The process of discussing and writing advance directives should happen over time. You can change the advance directives, even after you have signed them.  Advance directives include DNR or DNAR orders, living wills, and designating an agent as your medical power of attorney. This information is not intended to replace advice given to you by your health care provider. Make sure you discuss any questions you have with your health care provider. Document Released: 07/04/2007 Document Revised: 02/14/2016 Document Reviewed: 02/14/2016 Elsevier Interactive Patient Education  2017 [place] Maintenance, Female Adopting a healthy lifestyle and getting preventive care can go a long way to promote health and wellness. Talk with your health care provider about what schedule of regular examinations is right for you. This is a good chance for you to check in with your provider about disease prevention and staying healthy. In between checkups, there  are plenty of things you can do on your own. Experts have done a lot of research about which lifestyle changes and preventive measures are most likely to keep you healthy. Ask your health care provider for more information. Weight and diet Eat a healthy diet  Be sure to include plenty of vegetables, fruits, low-fat dairy products, and lean protein.  Do not eat a lot of foods high in solid fats, added sugars, or salt.  Get regular exercise. This is one of the most important things you can do for your health. ? Most adults should exercise for at least 150 minutes each week. The exercise should increase your heart rate and make you sweat (moderate-intensity exercise). ? Most adults should also do strengthening exercises at least twice a week. This is in addition to the moderate-intensity exercise.  Maintain a healthy weight  Body mass index (BMI) is a measurement that can be used to identify possible weight problems. It estimates body fat based on height and weight. Your health care provider can help determine your BMI and help you achieve or maintain a healthy weight.  For females 64 years of age and older: ?  A BMI below 18.5 is considered underweight. ? A BMI of 18.5 to 24.9 is normal. ? A BMI of 25 to 29.9 is considered overweight. ? A BMI of 30 and above is considered obese.  Watch levels of cholesterol and blood lipids  You should start having your blood tested for lipids and cholesterol at 73 years of age, then have this test every 5 years.  You may need to have your cholesterol levels checked more often if: ? Your lipid or cholesterol levels are high. ? You are older than 73 years of age. ? You are at high risk for heart disease.  Cancer screening Lung Cancer  Lung cancer screening is recommended for adults 15-48 years old who are at high risk for lung cancer because of a history of smoking.  A yearly low-dose CT scan of the lungs is recommended for people who: ? Currently  smoke. ? Have quit within the past 15 years. ? Have at least a 30-pack-year history of smoking. A pack year is smoking an average of one pack of cigarettes a day for 1 year.  Yearly screening should continue until it has been 15 years since you quit.  Yearly screening should stop if you develop a health problem that would prevent you from having lung cancer treatment.  Breast Cancer  Practice breast self-awareness. This means understanding how your breasts normally appear and feel.  It also means doing regular breast self-exams. Let your health care provider know about any changes, no matter how small.  If you are in your 20s or 30s, you should have a clinical breast exam (CBE) by a health care provider every 1-3 years as part of a regular health exam.  If you are 27 or older, have a CBE every year. Also consider having a breast X-ray (mammogram) every year.  If you have a family history of breast cancer, talk to your health care provider about genetic screening.  If you are at high risk for breast cancer, talk to your health care provider about having an MRI and a mammogram every year.  Breast cancer gene (BRCA) assessment is recommended for women who have family members with BRCA-related cancers. BRCA-related cancers include: ? Breast. ? Ovarian. ? Tubal. ? Peritoneal cancers.  Results of the assessment will determine the need for genetic counseling and BRCA1 and BRCA2 testing.  Cervical Cancer Your health care provider may recommend that you be screened regularly for cancer of the pelvic organs (ovaries, uterus, and vagina). This screening involves a pelvic examination, including checking for microscopic changes to the surface of your cervix (Pap test). You may be encouraged to have this screening done every 3 years, beginning at age 1.  For women ages 23-65, health care providers may recommend pelvic exams and Pap testing every 3 years, or they may recommend the Pap and pelvic  exam, combined with testing for human papilloma virus (HPV), every 5 years. Some types of HPV increase your risk of cervical cancer. Testing for HPV may also be done on women of any age with unclear Pap test results.  Other health care providers may not recommend any screening for nonpregnant women who are considered low risk for pelvic cancer and who do not have symptoms. Ask your health care provider if a screening pelvic exam is right for you.  If you have had past treatment for cervical cancer or a condition that could lead to cancer, you need Pap tests and screening for cancer for at least 20  years after your treatment. If Pap tests have been discontinued, your risk factors (such as having a new sexual partner) need to be reassessed to determine if screening should resume. Some women have medical problems that increase the chance of getting cervical cancer. In these cases, your health care provider may recommend more frequent screening and Pap tests.  Colorectal Cancer  This type of cancer can be detected and often prevented.  Routine colorectal cancer screening usually begins at 73 years of age and continues through 73 years of age.  Your health care provider may recommend screening at an earlier age if you have risk factors for colon cancer.  Your health care provider may also recommend using home test kits to check for hidden blood in the stool.  A small camera at the end of a tube can be used to examine your colon directly (sigmoidoscopy or colonoscopy). This is done to check for the earliest forms of colorectal cancer.  Routine screening usually begins at age 41.  Direct examination of the colon should be repeated every 5-10 years through 73 years of age. However, you may need to be screened more often if early forms of precancerous polyps or small growths are found.  Skin Cancer  Check your skin from head to toe regularly.  Tell your health care provider about any new moles or  changes in moles, especially if there is a change in a mole's shape or color.  Also tell your health care provider if you have a mole that is larger than the size of a pencil eraser.  Always use sunscreen. Apply sunscreen liberally and repeatedly throughout the day.  Protect yourself by wearing long sleeves, pants, a wide-brimmed hat, and sunglasses whenever you are outside.  Heart disease, diabetes, and high blood pressure  High blood pressure causes heart disease and increases the risk of stroke. High blood pressure is more likely to develop in: ? People who have blood pressure in the high end of the normal range (130-139/85-89 mm Hg). ? People who are overweight or obese. ? People who are African American.  If you are 17-76 years of age, have your blood pressure checked every 3-5 years. If you are 37 years of age or older, have your blood pressure checked every year. You should have your blood pressure measured twice-once when you are at a hospital or clinic, and once when you are not at a hospital or clinic. Record the average of the two measurements. To check your blood pressure when you are not at a hospital or clinic, you can use: ? An automated blood pressure machine at a pharmacy. ? A home blood pressure monitor.  If you are between 24 years and 27 years old, ask your health care provider if you should take aspirin to prevent strokes.  Have regular diabetes screenings. This involves taking a blood sample to check your fasting blood sugar level. ? If you are at a normal weight and have a low risk for diabetes, have this test once every three years after 73 years of age. ? If you are overweight and have a high risk for diabetes, consider being tested at a younger age or more often. Preventing infection Hepatitis B  If you have a higher risk for hepatitis B, you should be screened for this virus. You are considered at high risk for hepatitis B if: ? You were born in a country where  hepatitis B is common. Ask your health care provider which countries are  considered high risk. ? Your parents were born in a high-risk country, and you have not been immunized against hepatitis B (hepatitis B vaccine). ? You have HIV or AIDS. ? You use needles to inject street drugs. ? You live with someone who has hepatitis B. ? You have had sex with someone who has hepatitis B. ? You get hemodialysis treatment. ? You take certain medicines for conditions, including cancer, organ transplantation, and autoimmune conditions.  Hepatitis C  Blood testing is recommended for: ? Everyone born from [birth year] through 1966. ? Anyone with known risk factors for hepatitis C.  Sexually transmitted infections (STIs)  You should be screened for sexually transmitted infections (STIs) including gonorrhea and chlamydia if: ? You are sexually active and are younger than 73 years of age. ? You are older than 73 years of age and your health care provider tells you that you are at risk for this type of infection. ? Your sexual activity has changed since you were last screened and you are at an increased risk for chlamydia or gonorrhea. Ask your health care provider if you are at risk.  If you do not have HIV, but are at risk, it may be recommended that you take a prescription medicine daily to prevent HIV infection. This is called pre-exposure prophylaxis (PrEP). You are considered at risk if: ? You are sexually active and do not regularly use condoms or know the HIV status of your partner(s). ? You take drugs by injection. ? You are sexually active with a partner who has HIV.  Talk with your health care provider about whether you are at high risk of being infected with HIV. If you choose to begin PrEP, you should first be tested for HIV. You should then be tested every 3 months for as long as you are taking PrEP. Pregnancy  If you are premenopausal and you may become pregnant, ask your health care provider  about preconception counseling.  If you may become pregnant, take 400 to 800 micrograms (mcg) of folic acid every day.  If you want to prevent pregnancy, talk to your health care provider about birth control (contraception). Osteoporosis and menopause  Osteoporosis is a disease in which the bones lose minerals and strength with aging. This can result in serious bone fractures. Your risk for osteoporosis can be identified using a bone density scan.  If you are 44 years of age or older, or if you are at risk for osteoporosis and fractures, ask your health care provider if you should be screened.  Ask your health care provider whether you should take a calcium or vitamin D supplement to lower your risk for osteoporosis.  Menopause may have certain physical symptoms and risks.  Hormone replacement therapy may reduce some of these symptoms and risks. Talk to your health care provider about whether hormone replacement therapy is right for you. Follow these instructions at home:  Schedule regular health, dental, and eye exams.  Stay current with your immunizations.  Do not use any tobacco products including cigarettes, chewing tobacco, or electronic cigarettes.  If you are pregnant, do not drink alcohol.  If you are breastfeeding, limit how much and how often you drink alcohol.  Limit alcohol intake to no more than 1 drink per day for nonpregnant women. One drink equals 12 ounces of beer, 5 ounces of wine, or 1 ounces of hard liquor.  Do not use street drugs.  Do not share needles.  Ask your health care provider for  help if you need support or information about quitting drugs.  Tell your health care provider if you often feel depressed.  Tell your health care provider if you have ever been abused or do not feel safe at home. This information is not intended to replace advice given to you by your health care provider. Make sure you discuss any questions you have with your health care  provider. Document Released: 10/10/2010 Document Revised: 09/02/2015 Document Reviewed: 12/29/2014 Elsevier Interactive Patient Education  2018 [employer]  Ganglion Cyst A ganglion cyst is a noncancerous, fluid-filled lump that occurs near joints or tendons. The ganglion cyst grows out of a joint or the lining of a tendon. It most often develops in the hand or wrist, but it can also develop in the shoulder, elbow, hip, knee, ankle, or foot. The round or oval ganglion cyst can be the size of a pea or larger than a grape. Increased activity may enlarge the size of the cyst because more fluid starts to build up. What are the causes? It is not known what causes a ganglion cyst to grow. However, it may be related to:  Inflammation or irritation around the joint.  An injury.  Repetitive movements or overuse.  Arthritis.  What increases the risk? Risk factors include:  Being a woman.  Being age 20-50.  What are the signs or symptoms? Symptoms may include:  A lump. This most often appears on the hand or wrist, but it can occur in other areas of the body.  Tingling.  Pain.  Numbness.  Muscle weakness.  Weak grip.  Less movement in a joint.  How is this diagnosed? Ganglion cysts are most often diagnosed based on a physical exam. Your health care provider will feel the lump and may shine a light alongside it. If it is a ganglion cyst, a light often shines through it. Your health care provider may order an X-ray, ultrasound, or MRI to rule out other conditions. How is this treated? Ganglion cysts usually go away on their own without treatment. If pain or other symptoms are involved, treatment may be needed. Treatment is also needed if the ganglion cyst limits your movement or if it gets infected. Treatment may include:  Wearing a brace or splint on your wrist or finger.  Taking anti-inflammatory medicine.  Draining fluid from the lump with a needle (aspiration).  Injecting  a steroid into the joint.  Surgery to remove the ganglion cyst.  Follow these instructions at home:  Do not press on the ganglion cyst, poke it with a needle, or hit it.  Take medicines only as directed by your health care provider.  Wear your brace or splint as directed by your health care provider.  Watch your ganglion cyst for any changes.  Keep all follow-up visits as directed by your health care provider. This is important. Contact a health care provider if:  Your ganglion cyst becomes larger or more painful.  You have increased redness, red streaks, or swelling.  You have pus coming from the lump.  You have weakness or numbness in the affected area.  You have a fever or chills. This information is not intended to replace advice given to you by your health care provider. Make sure you discuss any questions you have with your health care provider. Document Released: 03/24/2000 Document Revised: 09/02/2015 Document Reviewed: 09/09/2013 Elsevier Interactive Patient Education  2018 [employer]  

## 2017-06-25 NOTE — Assessment & Plan Note (Signed)
USPSTF grade A and B recommendations reviewed with patient; age-appropriate recommendations, preventive care, screening tests, etc discussed and encouraged; healthy living encouraged; see AVS for patient education given to patient  

## 2017-06-25 NOTE — Assessment & Plan Note (Signed)
Right thumb; explained dx; can refer to hand specailsit if bothersome; never ever poke with a needle to try to drain it

## 2017-06-25 NOTE — Progress Notes (Signed)
Patient: Dana Peters, Female    DOB: 1946/02/06, 72 y.o.   MRN: [id number]  Visit Date: 06/25/2017  Today's Provider: Baruch GoutyMelinda Taiga Lupinacci, MD   Chief Complaint  Patient presents with  . Medicare Wellness  . Hand Pain    right thumb a knot onset 1 year ago and getting larger    Subjective:   Dana Peters is a 72 y.o. female who presents today for her Subsequent Annual Wellness Visit.  She has two knots on the right thumb; one has been there almost two years; smaller one on the radial side popped up a month ago; not painful; good ROM  USPSTF grade A and B recommendations Depression:  [facility] 06/25/2017 06/04/2017 01/19/2017 10/26/2016 06/15/2016  Decreased Interest 0 0 0 0 0  Down, Depressed, Hopeless 0 0 0 0 0  PHQ - 2 Score 0 0 0 0 0   Hypertension: BP Readings from Last 3 Encounters:  06/25/17 134/76  06/04/17 122/82  05/28/17 (!) 142/74   Obesity: Wt Readings from Last 3 Encounters:  06/25/17 193 lb 3.2 oz (87.6 kg)  06/04/17 195 lb 9.6 oz (88.7 kg)  05/28/17 194 lb (88 kg)   BMI Readings from Last 3 Encounters:  06/25/17 29.38 kg/m  06/04/17 29.31 kg/m  05/28/17 29.07 kg/m    Skin cancer: no worrisome moles; one on the left helix, no changes in 10 changes Lung cancer:  Former smoker, quit 10 years ago, 1 ppd x 25 years; less than 30 pack years Breast cancer: due in October 2019 Colorectal cancer: last done 2016; five years next due 2021 Cervical cancer screening: s/p hyst HIV, hep B, hep C: not interested STD testing and prevention (chl/gon/syphilis): not interested Intimate partner violence: no abuse Osteoporosis: due April 2020 Fall prevention/vitamin D: discussed Immunizations: discussed Diet: taking calcium, calcium-rich food; limiting saturated fats Exercise: walks a little, planting garden, flowers, going to exercise bike; wants to do water aerobics with [employer] Alcohol: no Tobacco use: quit 10 years ago AAA: n/a Aspirin:  taking aspirin daily Glucose: last A1c excellent Glucose, Bld  Date Value Ref Range Status  05/28/2017 107 (H) 65 - 99 mg/dL Final  40/98/119106/28/2018 478107 (H) 65 - 99 mg/dL Final  29/56/213011/09/2015 71 65 - 99 mg/dL Final   Lipids:  Lab Results  Component Value Date   CHOL 166 10/26/2016   CHOL 152 11/09/2015   CHOL 177 05/07/2015   Lab Results  Component Value Date   HDL 51 10/26/2016   HDL 56 11/09/2015   HDL 47 05/07/2015   Lab Results  Component Value Date   LDLCALC 76 10/26/2016   LDLCALC 74 11/09/2015   LDLCALC 90 05/07/2015   Lab Results  Component Value Date   TRIG 197 (H) 10/26/2016   TRIG 112 11/09/2015   TRIG 199 (H) 05/07/2015   Lab Results  Component Value Date   CHOLHDL 3.3 10/26/2016   CHOLHDL 2.7 11/09/2015   No results found for: LDLDIRECT  Review of Systems  Past Medical History:  Diagnosis Date  . Allergy   . Baker's cyst of knee, right 06/04/2017   US Feb 2019  . CKD (chronic kidney disease) stage 2, GFR 60-89 ml/min   . Dyslipidemia   . GERD (gastroesophageal reflux disease)   . Hypertension   . Lumbar spinal stenosis   . Neuropathy    both feet  . Obesity (BMI 30.0-34.9) 05/29/2016  . Osteopenia 06/15/2016   March 2018  . Personal history  of tobacco use, presenting hazards to health 07/14/2015  . Reflux   . Sciatica   . Thoracic aortic atherosclerosis (HCC) 08/01/2016   Chest CT  . Venous stasis   . Vitamin D deficiency disease     Past Surgical History:  Procedure Laterality Date  . ABDOMINAL HYSTERECTOMY     complete, due to heavy  . CATARACT EXTRACTION, BILATERAL    . ECTOPIC PREGNANCY SURGERY      Family History  Problem Relation Age of Onset  . Cancer Mother        colon  . Hypertension Mother   . Alzheimer's disease Mother   . Cancer Father        prostate  . Cancer Brother        lung  . Stroke Brother   . Cancer Brother        prostate and brain  . Cancer Brother        throat  . Diabetes Paternal Aunt   . Breast cancer  Paternal Aunt   . Healthy Daughter   . Cirrhosis Brother     Social History   Tobacco Use  . Smoking status: Former Smoker    Packs/day: 1.00    Years: 30.00    Pack years: 30.00    Types: Cigarettes    Last attempt to quit: 10/13/2005    Years since quitting: 11.7  . Smokeless tobacco: Former Neurosurgeon    Types: Snuff    Quit date: 10/09/2011  Substance Use Topics  . Alcohol use: No    Alcohol/week: 0.0 oz  . Drug use: No    Outpatient Encounter Medications as of 06/25/2017  Medication Sig  . aspirin EC 81 MG tablet Take 1 tablet (81 mg total) by mouth daily.  . Cholecalciferol (VITAMIN D) 2000 units CAPS Take by mouth daily.  . DULoxetine (CYMBALTA) 30 MG capsule Take 1 capsule (30 mg total) by mouth daily.  Marland Kitchen ezetimibe (ZETIA) 10 MG tablet Take 1 tablet (10 mg total) daily by mouth.  . loratadine (CLARITIN) 10 MG tablet Take 1 tablet (10 mg total) by mouth daily as needed.  . mometasone (NASONEX) 50 MCG/ACT nasal spray Place 2 sprays into the nose as needed.   . montelukast (SINGULAIR) 10 MG tablet Take 1 tablet (10 mg total) by mouth daily as needed.  Marland Kitchen Spacer/Aero-Holding Chambers (AEROCHAMBER PLUS) inhaler Use as instructed   No facility-administered encounter medications on file as of 06/25/2017.     Functional Ability / Safety Screening 1.  Was the timed Get Up and Go test less than 12 seconds?  no 2.  Does the patient need help with the phone, transportation, shopping,      preparing meals, housework, laundry, medications, or managing money?  no 3.  Does the patient's home have:  loose throw rugs in the hallway?   no      Grab bars in the bathroom? no      Handrails on the stairs?   no stairs      Good lighting?   yes 4.  Has the patient noticed any hearing difficulties?   no  Advanced Directives Does patient have a HCPOA?    no If yes, name and contact information: husband, Dana Peters, [phone] Does patient have a living will or MOST form?  no Full code at  this time; if her health declines and may change then  Immunizations: recommended Shingrix at pharmacy  Fall Risk Assessment See under rooming  Depression Screen  See under rooming Depression screen [facility] 2/9 06/25/2017 06/04/2017 01/19/2017 10/26/2016 06/15/2016  Decreased Interest 0 0 0 0 0  Down, Depressed, Hopeless 0 0 0 0 0  PHQ - 2 Score 0 0 0 0 0    Objective:   Vitals: BP 134/76   Pulse 78   Temp 98.5 F (36.9 C) (Oral)   Resp 16   Ht 5\' 8"  (1.727 m)   Wt 193 lb 3.2 oz (87.6 kg)   LMP  (LMP Unknown)   SpO2 93%   BMI 29.38 kg/m  Body mass index is 29.38 kg/m. No exam data present  Physical Exam  Constitutional: She appears well-developed and well-nourished.  HENT:  Mouth/Throat: Mucous membranes are normal.  Eyes: No scleral icterus.  Cardiovascular: Normal rate.  Pulmonary/Chest: Effort normal.  Musculoskeletal:       Right hand: She exhibits deformity (two nodular swellings on the extensor surface of the right thumb).       Hands: Psychiatric: She has a normal mood and affect.   Mood/affect:  euthymic Appearance:  Casually dressed, comfortable  6CIT Screen 06/25/2017 06/15/2016  What Year? 0 points 0 points  What month? 0 points 0 points  What time? 0 points 0 points  Count back from 20 0 points 0 points  Months in reverse 2 points 0 points  Repeat phrase 0 points 2 points  Total Score 2 2    Assessment & Plan:     Annual Wellness Visit  Reviewed patient's Family Medical History Reviewed and updated list of patient's medical providers Assessment of cognitive impairment was done Assessed patient's functional ability Established a written schedule for health screening services Health Risk Assessent Completed and Reviewed  Exercise Activities and Dietary recommendations Goals    . Increase physical activity (pt-stated)       Immunization History  Administered Date(s) Administered  . Influenza, High Dose Seasonal PF 01/19/2017  .  Influenza-Unspecified 03/18/2014, 01/05/2015, 01/03/2016  . Pneumococcal Conjugate-13 11/18/2013  . Pneumococcal Polysaccharide-23 01/10/2014  . Td 01/10/2014  . Zoster 05/22/2014    Health Maintenance  Topic Date Due  . MAMMOGRAM  01/18/2018  . DEXA SCAN  07/19/2018  . COLONOSCOPY  07/10/2019  . TETANUS/TDAP  01/11/2024  . INFLUENZA VACCINE  Completed  . Hepatitis C Screening  Completed  . PNA vac Low Risk Adult  Completed    Discussed health benefits of physical activity, and encouraged her to engage in regular exercise appropriate for her age and condition.   No orders of the defined types were placed in this encounter.   Current Outpatient Medications:  .  aspirin EC 81 MG tablet, Take 1 tablet (81 mg total) by mouth daily., Disp: 30 tablet, Rfl: 11 .  Cholecalciferol (VITAMIN D) 2000 units CAPS, Take by mouth daily., Disp: , Rfl:  .  DULoxetine (CYMBALTA) 30 MG capsule, Take 1 capsule (30 mg total) by mouth daily., Disp: 30 capsule, Rfl: 3 .  ezetimibe (ZETIA) 10 MG tablet, Take 1 tablet (10 mg total) daily by mouth., Disp: 90 tablet, Rfl: 3 .  loratadine (CLARITIN) 10 MG tablet, Take 1 tablet (10 mg total) by mouth daily as needed., Disp: 90 tablet, Rfl: 1 .  mometasone (NASONEX) 50 MCG/ACT nasal spray, Place 2 sprays into the nose as needed. , Disp: , Rfl:  .  montelukast (SINGULAIR) 10 MG tablet, Take 1 tablet (10 mg total) by mouth daily as needed., Disp: 90 tablet, Rfl: 1 .  Spacer/Aero-Holding Chambers (AEROCHAMBER PLUS) inhaler, Use  as instructed, Disp: 1 each, Rfl: 2 There are no discontinued medications.  Next Medicare Wellness Visit in 12+ months  Problem List Items Addressed This Visit      Other   Personal history of tobacco use, presenting hazards to health    Less than 30 pack years; no CT scan chest indicated      Medicare annual wellness visit, subsequent - Primary    USPSTF grade A and B recommendations reviewed with patient; age-appropriate  recommendations, preventive care, screening tests, etc discussed and encouraged; healthy living encouraged; see AVS for patient education given to patient      Ganglion cyst of finger    Right thumb; explained dx; can refer to hand specailsit if bothersome; never ever poke with a needle to try to drain it      Full code status    Patient wishes to be full code at her current level of health; papers given today for HCPOA and living will; she will take this home and discuss with her family      Relevant Orders   Full code

## 2017-06-25 NOTE — Assessment & Plan Note (Signed)
Less than 30 pack years; no CT scan chest indicated

## 2017-06-25 NOTE — Assessment & Plan Note (Signed)
Patient wishes to be full code at her current level of health; papers given today for HCPOA and living will; she will take this home and discuss with her family

## 2017-07-11 ENCOUNTER — Other Ambulatory Visit: Payer: Self-pay | Admitting: Family Medicine

## 2017-07-26 ENCOUNTER — Telehealth: Payer: Self-pay | Admitting: *Deleted

## 2017-07-26 DIAGNOSIS — Z122 Encounter for screening for malignant neoplasm of respiratory organs: Secondary | ICD-10-CM

## 2017-07-26 DIAGNOSIS — Z87891 Personal history of nicotine dependence: Secondary | ICD-10-CM

## 2017-07-26 NOTE — Telephone Encounter (Signed)
Notified patient that annual lung cancer screening low dose CT scan is due currently or will be in near future. Confirmed that patient is within the age range of 55-77, and asymptomatic, (no signs or symptoms of lung cancer). Patient denies illness that would prevent curative treatment for lung cancer if found. Verified smoking history, (former, quit 2007, 30 pack year). The shared decision making visit was done 07/14/15. Patient is agreeable for CT scan being scheduled.

## 2017-08-07 ENCOUNTER — Ambulatory Visit
Admission: RE | Admit: 2017-08-07 | Discharge: 2017-08-07 | Disposition: A | Discharge status: Home / Self Care | Payer: Medicare HMO | Source: Ambulatory Visit | Attending: Nurse Practitioner | Admitting: Nurse Practitioner

## 2017-08-07 DIAGNOSIS — I2510 Atherosclerotic heart disease of native coronary artery without angina pectoris: Secondary | ICD-10-CM | POA: Diagnosis not present

## 2017-08-07 DIAGNOSIS — J439 Emphysema, unspecified: Secondary | ICD-10-CM | POA: Insufficient documentation

## 2017-08-07 DIAGNOSIS — I700 Atherosclerosis of aorta: Secondary | ICD-10-CM | POA: Diagnosis not present

## 2017-08-07 DIAGNOSIS — Z87891 Personal history of nicotine dependence: Secondary | ICD-10-CM

## 2017-08-07 DIAGNOSIS — Z122 Encounter for screening for malignant neoplasm of respiratory organs: Secondary | ICD-10-CM | POA: Diagnosis present

## 2017-08-14 ENCOUNTER — Telehealth: Payer: Self-pay | Admitting: Family Medicine

## 2017-08-14 ENCOUNTER — Encounter: Payer: Self-pay | Admitting: *Deleted

## 2017-08-14 DIAGNOSIS — J439 Emphysema, unspecified: Secondary | ICD-10-CM | POA: Insufficient documentation

## 2017-08-14 NOTE — Telephone Encounter (Signed)
Please ask patient to schedule an appt to go over the results of her CT scan Thank you

## 2017-08-14 NOTE — Telephone Encounter (Signed)
Spoke with pt and made appt for this coming friday

## 2017-08-17 ENCOUNTER — Ambulatory Visit (INDEPENDENT_AMBULATORY_CARE_PROVIDER_SITE_OTHER): Payer: Medicare HMO | Admitting: Family Medicine

## 2017-08-17 ENCOUNTER — Encounter: Payer: Self-pay | Admitting: Family Medicine

## 2017-08-17 DIAGNOSIS — E66811 Obesity, class 1: Secondary | ICD-10-CM

## 2017-08-17 DIAGNOSIS — I739 Peripheral vascular disease, unspecified: Secondary | ICD-10-CM | POA: Diagnosis not present

## 2017-08-17 DIAGNOSIS — E669 Obesity, unspecified: Secondary | ICD-10-CM | POA: Diagnosis not present

## 2017-08-17 DIAGNOSIS — I2510 Atherosclerotic heart disease of native coronary artery without angina pectoris: Secondary | ICD-10-CM

## 2017-08-17 DIAGNOSIS — I700 Atherosclerosis of aorta: Secondary | ICD-10-CM | POA: Diagnosis not present

## 2017-08-17 DIAGNOSIS — J439 Emphysema, unspecified: Secondary | ICD-10-CM

## 2017-08-17 LAB — LIPID PANEL
Cholesterol: 155 mg/dL
HDL: 48 mg/dL — ABNORMAL LOW
LDL Cholesterol (Calc): 74 mg/dL
Non-HDL Cholesterol (Calc): 107 mg/dL
Total CHOL/HDL Ratio: 3.2 (calc)
Triglycerides: 251 mg/dL — ABNORMAL HIGH

## 2017-08-17 NOTE — Assessment & Plan Note (Signed)
discussed

## 2017-08-17 NOTE — Assessment & Plan Note (Signed)
Encouraged weight loss, adequate hydration, more movement

## 2017-08-17 NOTE — Patient Instructions (Addendum)
Try almond milk unsweetened, plain instead of whole milk Limit cheese Limit eggs or avoid them altogether Limit saturated fats as much as you can  Try to limit saturated fats in your diet (bologna, hot dogs, barbeque, cheeseburgers, hamburgers, steak, bacon, sausage, cheese, etc.) and get more fresh fruits, vegetables, and whole grains  Atherosclerosis Atherosclerosis is narrowing and hardening of the blood vessels (arteries). Arteries are tubes that carry blood that contains oxygen from the heart to all parts of the body. Arteries can become narrow or clogged with a buildup of fat, cholesterol, calcium, or other substances (plaque). Plaque decreases the amount of blood that can flow through the artery. Atherosclerosis can affect any artery in the body, including:  Heart arteries (coronary artery disease), which may cause heart attack.  Brain arteries, which may cause stroke.  Leg, arm, and pelvis arteries (peripheral artery disease), which may cause pain and numbness.  Kidney arteries, which may cause kidney (renal) failure.  Treatment may slow the disease and prevent further damage to the heart, brain, peripheral arteries, and kidneys. What are the causes? Atherosclerosis develops when plaque forms in an artery. This damages the inside wall of the artery. Over time, the plaque grows and hardens. It may break through the artery wall. This causes a blood clot to form over the break, which narrows the artery more. The clot may also break loose and travel to other arteries, causing more damage. What increases the risk? This condition is more likely to develop in people who:  Are middle age or older.  Have a family history of atherosclerosis.  Have high cholesterol.  Have high blood fats (triglycerides).  Have diabetes.  Are overweight.  Smoke tobacco.  Do not exercise enough.  Have a substance in the blood that indicates increased levels of inflammation in the body (C-reactive  protein, or CRP).  Have sleep apnea.  Are stressed.  Drink too much alcohol.  What are the signs or symptoms? This condition may not cause any symptoms. If you do have symptoms, they are caused by damage to an area of your body that is not getting enough blood. The following symptoms are possible:  Coronary artery disease may cause chest pain and shortness of breath.  Decreased blood supply to your brain may cause a stroke. Signs and symptoms of stroke may include sudden: ? Weakness on one side of the body. ? Confusion. ? Changes in vision. ? Inability to speak or understand speech. ? Loss of balance, coordination, or ability to walk. ? Severe headache. ? Loss of consciousness.  Peripheral artery disease may cause pain and numbness, often in the legs and hips.  Renal failure may cause fatigue, nausea, swelling, and itchy skin.  How is this diagnosed? This condition is diagnosed based on your medical history and a physical exam. During the exam, your health care provider will check your pulses and listen for a "whooshing" sound over your arteries (bruit). You may have tests, such as:  Blood tests to check your levels of cholesterol, triglycerides, and CRP.  Electrocardiogram (ECG) to check for heart damage.  Chest X-ray to see if your heart is enlarged, which is a sign of heart failure.  Stress test to see how your heart reacts to exercise.  Echocardiogram to get images of the inside of your heart.  Ankle-Brachial index to compare blood pressure in your arms to blood pressure in your ankles.  Ultrasound of your peripheral arteries to check blood flow.  CT scan to check for  damage to your heart or brain.  X-rays of blood vessels after dye has been injected (angiogram) to check blood flow.  How is this treated? Treatment starts with lifestyle changes, which may include:  Changing your diet.  Losing weight.  Reducing stress.  Exercising and being more physically  active.  Not smoking.  You also may need medicine to:  Lower triglycerides and cholesterol.  Lower and control blood pressure.  Prevent blood clots.  Lower inflammation in your body.  Lower and control your blood sugar.  Sometimes, surgery is needed to remove plaque, widen your artery, or create a new path for your blood (bypass). Surgical treatment may include:  Removing plaque from an artery (endarterectomy).  Opening a narrowed heart artery (angioplasty).  Heart or peripheral artery bypass graft surgery.  Follow these instructions at home: Lifestyle   Eat a heart-healthy diet. Talk to your health care provider or a diet specialist (dietitian) if you need help. A heart-healthy diet includes: ? Limiting unhealthy fats and increasing healthy fats. Some examples of healthy fats are olive oil and canola oil. ? Eating plant-based foods, such as fruits, vegetables, nuts, legumes, and whole grains.  Follow an exercise program as told by your health care provider.  Maintain a healthy weight. Lose weight if directed by your health care provider.  Rest when you are tired.  Learn to manage your stress.  Do not use any tobacco products, such as cigarettes, chewing tobacco, and e-cigarettes. If you need help quitting, ask your health care provider.  Limit alcohol intake to no more than 1 drink a day for nonpregnant women and 2 drinks a day for men. One drink equals 12 oz of beer, 5 oz of wine, or 1 oz of hard liquor.  Do not abuse drugs. General instructions  Take over-the-counter and prescription medicines only as told by your health care provider.  Manage other health conditions as told by your health care provider.  Keep all follow-up visits as told by your health care provider. This is important. Contact a health care provider if:  You have chest pain or discomfort. This includes squeezing chest pain that may feel like indigestion (angina).  You have shortness of  breath.  You have an irregular heartbeat.  You have unexplained fatigue.  You have unexplained pain or numbness in an arm, leg, or hip.  You have nausea, swelling of your hands or feet, and itchy skin. Get help right away if:  You have symptoms of a heart attack, such as: ? Chest pain. ? Shortness of breath. ? Pain in your neck, jaw, arms, back, or stomach. ? Cold sweat. ? Nausea. ? Light-headedness.  You have symptoms of a stroke, such as sudden: ? Weakness on one side of your body. ? Confusion. ? Changes in vision. ? Inability to speak or understand speech. ? Loss of balance, coordination, or ability to walk. ? Severe headache. ? Loss of consciousness. These symptoms may represent a serious problem that is an emergency. Do not wait to see if the symptoms will go away. Get medical help right away. Call your local emergency services (911 in the [place] Do not drive yourself to the hospital. This information is not intended to replace advice given to you by your health care provider. Make sure you discuss any questions you have with your health care provider. Document Released: 06/17/2003 Document Revised: 09/02/2015 Document Reviewed: 02/15/2015 Elsevier Interactive Patient Education  2018 [employer]  Coronary Calcium Scan A coronary calcium scan  is an imaging test used to look for deposits of calcium and other fatty materials (plaques) in the inner lining of the blood vessels of the heart (coronary arteries). These deposits of calcium and plaques can partly clog and narrow the coronary arteries without producing any symptoms or warning signs. This puts a person at risk for a heart attack. This test can detect these deposits before symptoms develop. Tell a health care provider about:  Any allergies you have.  All medicines you are taking, including vitamins, herbs, eye drops, creams, and over-the-counter medicines.  Any problems you or family members have had with  anesthetic medicines.  Any blood disorders you have.  Any surgeries you have had.  Any medical conditions you have.  Whether you are pregnant or may be pregnant. What are the risks? Generally, this is a safe procedure. However, problems may occur, including:  Harm to a pregnant woman and her unborn baby. This test involves the use of radiation. Radiation exposure can be dangerous to a pregnant woman and her unborn baby. If you are pregnant, you generally should not have this procedure done.  Slight increase in the risk of cancer. This is because of the radiation involved in the test.  What happens before the procedure? No preparation is needed for this procedure. What happens during the procedure?  You will undress and remove any jewelry around your neck or chest.  You will put on a hospital gown.  Sticky electrodes will be placed on your chest. The electrodes will be connected to an electrocardiogram (ECG) machine to record a tracing of the electrical activity of your heart.  A CT scanner will take pictures of your heart. During this time, you will be asked to lie still and hold your breath for 2-3 seconds while a picture of your heart is being taken. The procedure may vary among health care providers and hospitals. What happens after the procedure?  You can get dressed.  You can return to your normal activities.  It is up to you to get the results of your test. Ask your health care provider, or the department that is doing the test, when your results will be ready. Summary  A coronary calcium scan is an imaging test used to look for deposits of calcium and other fatty materials (plaques) in the inner lining of the blood vessels of the heart (coronary arteries).  Generally, this is a safe procedure. Tell your health care provider if you are pregnant or may be pregnant.  No preparation is needed for this procedure.  A CT scanner will take pictures of your heart.  You can  return to your normal activities after the scan is done. This information is not intended to replace advice given to you by your health care provider. Make sure you discuss any questions you have with your health care provider. Document Released: 09/23/2007 Document Revised: 02/14/2016 Document Reviewed: 02/14/2016 Elsevier Interactive Patient Education  2017 [employer]  Coronary Artery Disease, Female Coronary artery disease (CAD) is a condition in which the arteries that lead to the heart (coronary arteries) become narrow or blocked. The narrowing or blockage can lead to decreased blood flow to the heart. Prolonged reduced blood flow can cause a heart attack (myocardial infarction or MI). This condition may also be called coronary heart disease. Because CAD is the leading cause of death in women, it is important to understand what causes this condition and how it is treated. What are the causes? CAD is  most often caused by atherosclerosis. This is the buildup of fat and cholesterol (plaque) on the inside of the arteries. Over time, the plaque may narrow or block the artery, reducing blood flow to the heart. Plaque can also become weak and break off within a coronary artery and cause a sudden blockage. Other less common causes of CAD include:  An embolism or blood clot in a coronary artery.  A tearing of the artery (spontaneous coronary artery dissection).  An aneurysm.  Inflammation (vasculitis) in the artery wall.  What increases the risk? The following factors may make you more likely to develop this condition:  Age. Women over age 50 are at a greater risk of CAD.  Family history of CAD.  High blood pressure (hypertension).  Diabetes.  High cholesterol levels.  Tobacco use.  Lack of exercise.  Menopause. ? All postmenopausal women are at greater risk of CAD. ? Women who have experienced menopause between the ages of 76-45 (early menopause) are at a higher risk of  CAD. ? Women who have experienced menopause before age 20 (premature menopause) are at a very high risk of CAD.  Excessive alcohol use  A diet high in saturated and trans fats, such as fried food and processed meat.  Other possible risk factors include:  High stress levels.  Depression  Obesity.  Sleep apnea.  What are the signs or symptoms? Many people do not have any symptoms during the early stages of CAD. As the condition progresses, symptoms may include:  Chest pain (angina). The pain can: ? Feel like crushing or squeezing, or a tightness, pressure, fullness, or heaviness in the chest. ? Last more than a few minutes or can stop and recur. The pain tends to get worse with exercise or stress and to fade with rest.  Pain in the arms, neck, jaw, or back.  Unexplained heartburn or indigestion.  Shortness of breath.  Nausea.  Sudden cold sweats.  Sudden light-headedness.  Fluttering or fast heartbeat (palpitations).  Many women have chest discomfort and the other symptoms. However, women often have unusual (atypical) symptoms, such as:  Fatigue.  Vomiting.  Unexplained feelings of nervousness or anxiety.  Unexplained weakness.  Dizziness or fainting.  How is this diagnosed? This condition is diagnosed based on:  Your family and medical history.  A physical exam.  Tests, including: ? A test to check the electrical signals in your heart (electrocardiogram). ? Exercise stress test. This looks for signs of blockage when the heart is stressed with exercise, such as running on a treadmill. ? Pharmacologic stress test. This test looks for signs of blockage when the heart is being stressed with a medicine. ? Blood tests. ? Coronary angiogram. This is a procedure to look at the coronary arteries to see if there is any blockage. During this test, a dye is injected into your arteries so they appear on an X-ray. ? A test that uses sound waves to take a picture of  your heart (echocardiogram). ? Chest X-ray.  How is this treated? This condition may be treated by:  Healthy lifestyle changes to reduce risk factors.  Medicines such as: ? Antiplatelet medicines and blood-thinning medicines, such as aspirin. These help prevent blood clots. ? Nitroglycerin. ? Blood pressure medicines. ? Cholesterol-lowering medicine.  Coronary angioplasty and stenting. During this procedure, a thin, flexible tube is inserted through a blood vessel and into a blocked artery. A balloon or similar device on the end of the tube is inflated to open  up the artery. In some cases, a small, mesh tube (stent) is inserted into the artery to keep it open.  Coronary artery bypass surgery. During this surgery, veins or arteries from other parts of the body are used to create a bypass around the blockage and allow blood to reach your heart.  Follow these instructions at home: Medicines  Take over-the-counter and prescription medicines only as told by your health care provider.  Do not take the following medicines unless your health care provider approves: ? NSAIDs, such as ibuprofen, naproxen, or celecoxib. ? Vitamin supplements that contain vitamin A, vitamin E, or both. ? Hormone replacement therapy that contains estrogen with or without progestin. Lifestyle  Follow an exercise program approved by your health care provider. Aim for 150 minutes of moderate exercise or 75 minutes of vigorous exercise each week.  Maintain a healthy weight or lose weight as approved by your health care provider.  Rest when you are tired.  Learn to manage stress or try to limit your stress. Ask your health care provider for suggestions if you need help.  Get screened for depression and seek treatment, if needed.  Do not use any products that contain nicotine or tobacco, such as cigarettes and e-cigarettes. If you need help quitting, ask your health care provider.  Do not use illegal  drugs. Eating and drinking  Follow a heart-healthy diet. A dietitian can help educate you about healthy food options and changes. In general, eat plenty of fruits and vegetables, lean meats, and whole grains.  Avoid foods high in: ? Sugar. ? Salt (sodium). ? Saturated fats, such as processed or fatty meat. ? Trans fats, such as fried food.  Use healthy cooking methods such as roasting, grilling, broiling, baking, poaching, steaming, or stir-frying.  If you drink alcohol, and your health care provider approves, limit your alcohol intake to no more than 1 drink per day. One drink equals 12 ounces of beer, 5 ounces of wine, or 1 ounces of hard liquor. General instructions  Manage any other health conditions, such as hypertension and diabetes. These conditions affect your heart.  Your health care provider may ask you to monitor your blood pressure. Ideally, your blood pressure should be below 130/80.  Keep all follow-up visits as told by your health care provider. This is important. Get help right away if:  You have pain in your chest, neck, arm, jaw, stomach, or back that: ? Lasts more than a few minutes. ? Is recurring. ? Is not relieved by taking medicine under your tongue (sublingualnitroglycerin).  You have profuse sweating without cause.  You have unexplained: ? Heartburn or indigestion. ? Shortness of breath or difficulty breathing. ? Fluttering or fast heartbeat (palpitations). ? Nausea or vomiting. ? Fatigue. ? Feelings of nervousness or anxiety. ? Weakness. ? Diarrhea.  You have sudden light-headedness or dizziness.  You faint.  You feel like hurting yourself or think about taking your own life. These symptoms may represent a serious problem that is an emergency. Do not wait to see if the symptoms will go away. Get medical help right away. Call your local emergency services (911 in the [place] Do not drive yourself to the hospital. Summary  Coronary artery  disease (CAD) is a process in which the arteries that lead to the heart (coronary arteries) become narrow or blocked. The narrowing or blockage can lead to a heart attack.  Many women have chest discomfort and other common symptoms of CAD. However, women often have different (  atypical) symptoms, such as fatigue, vomiting, and dizziness or weakness.  CAD can be treated with lifestyle changes, medicines, surgery, or a combination of these treatments. This information is not intended to replace advice given to you by your health care provider. Make sure you discuss any questions you have with your health care provider. Document Released: 06/19/2011 Document Revised: 03/17/2016 Document Reviewed: 03/17/2016 Elsevier Interactive Patient Education  [employer].

## 2017-08-17 NOTE — Progress Notes (Signed)
BP 130/76   Pulse 78   Temp 98 F (36.7 C) (Oral)   Resp 14   Ht $RemoveB 727 m)   Wt 198 lb (89.8 kg)   LMP  (LMP Unknown)   SpO2 94%   BMI 30.11 kg/m    Subjective:    Patient ID: Dana Peters, female    DOB: 20-Jan-1946, 72 y.o.   MRN: [id number]  HPI: Dana Peters is a 72 y.o. female  Chief Complaint  Patient presents with  . Results    CT scan    HPI She had a chest CT No signs of cancer Atherosclerosis in aorta Cousin had a heart attack, female around age 59  Fatty meats: not much bacon; eats pork chops once a month; hamburgers twice a month Eats quit a few eggs; maybe 3 a week, maybe none, varies Occasional likes milk in her cereal, whole Used to eat oatmeal every day, but got off of that; she was doing that with flaxseed for cleansing Lots of mayonnaise  Coronary artery athero; no chest pain; one cousin had the heart attack, but no one else in the family; she has never had a stress test; she saw Dr. Mariah Milling in 2017; he prescribed atorvastatin  Mild to moderate emphysema; not limited with activities; grew up around smokers; last cigarette was around 2010; quit over time, not cold [place]  Lab Results  Component Value Date   CHOL 155 08/17/2017   HDL 48 (L) 08/17/2017   LDLCALC 74 08/17/2017   TRIG 251 (H) 08/17/2017   CHOLHDL 3.2 08/17/2017     Depression screen PHQ 2/9 06/25/2017 06/04/2017 01/19/2017 10/26/2016 06/15/2016  Decreased Interest 0 0 0 0 0  Down, Depressed, Hopeless 0 0 0 0 0  PHQ - 2 Score 0 0 0 0 0    Relevant past medical, surgical, family and social history reviewed Past Medical History:  Diagnosis Date  . Allergy   . Baker's cyst of knee, right 06/04/2017   [name] Feb 2019  . CKD (chronic kidney disease) stage 2, GFR 60-89 ml/min   . Dyslipidemia   . GERD (gastroesophageal reflux disease)   . Hypertension   . Lumbar spinal stenosis   . Neuropathy    both feet  . Obesity (BMI 30.0-34.9) 05/29/2016  . Osteopenia 06/15/2016     March 2018  . Personal history of tobacco use, presenting hazards to health 07/14/2015  . Reflux   . Sciatica   . Thoracic aortic atherosclerosis (HCC) 08/01/2016   Chest CT  . Venous stasis   . Vitamin D deficiency disease    Past Surgical History:  Procedure Laterality Date  . ABDOMINAL HYSTERECTOMY     complete, due to heavy  . CATARACT EXTRACTION, BILATERAL    . ECTOPIC PREGNANCY SURGERY     Family History  Problem Relation Age of Onset  . Cancer Mother        colon  . Hypertension Mother   . Alzheimer's disease Mother   . Cancer Father        prostate  . Cancer Brother        lung  . Stroke Brother   . Cancer Brother        prostate and brain  . Cancer Brother        throat  . Diabetes Paternal Aunt   . Breast cancer Paternal Aunt   . Healthy Daughter   . Cirrhosis Brother    Social History  Tobacco Use  . Smoking status: Former Smoker    Packs/day: 1.00    Years: 30.00    Pack years: 30.00    Types: Cigarettes    Last attempt to quit: 10/13/2005    Years since quitting: 11.8  . Smokeless tobacco: Former Neurosurgeon    Types: Snuff    Quit date: 10/09/2011  Substance Use Topics  . Alcohol use: No    Alcohol/week: 0.0 oz  . Drug use: No    Interim medical history since last visit reviewed. Allergies and medications reviewed  Review of Systems Per HPI unless specifically indicated above     Objective:    BP 130/76   Pulse 78   Temp 98 F (36.7 C) (Oral)   Resp 14   Ht  (1.727 m)   Wt 198 lb (89.8 kg)   LMP  (LMP Unknown)   SpO2 94%   BMI 30.11 kg/m   Wt Readings from Last 3 Encounters:  08/17/17 198 lb (89.8 kg)  08/07/17 192 lb (87.1 kg)  06/25/17 193 lb 3.2 oz (87.6 kg)    Physical Exam  Constitutional: She appears well-developed and well-nourished.  HENT:  Mouth/Throat: Mucous membranes are normal.  Eyes: EOM are normal. No scleral icterus.  Cardiovascular: Normal rate and regular rhythm.  Pulmonary/Chest: Effort normal and breath  sounds normal.  Psychiatric: She has a normal mood and affect. Her behavior is normal.      Assessment & Plan:   Problem List Items Addressed This Visit      Cardiovascular and Mediastinum   Thoracic aortic atherosclerosis (HCC)    discussed      Relevant Orders   Lipid panel (Completed)   PAD (peripheral artery disease) (HCC) (Chronic)    Goal LDL is less than 70; on aspirin lipid-lowering agent; check cholesterol today      Relevant Orders   Lipid panel (Completed)   Coronary artery calcification seen on CT scan (Chronic)    Already seen by cardiologist; reasons to call 911 reviewed; check lipids today; continue statin and aspirin      Relevant Orders   Lipid panel (Completed)     Respiratory   Emphysema of lung (HCC)    Explained diagnosis; avoid 2nd hand smoke; let me know if symptomatic and we can start medicine        Other   Obesity (BMI 30.0-34.9)    Encouraged weight loss, adequate hydration, more movement          Follow up plan: No follow-ups on file.  An after-visit summary was printed and given to the patient at check-out.  Please see the patient instructions which may contain other information and recommendations beyond what is mentioned above in the assessment and plan.  No orders of the defined types were placed in this encounter.   Orders Placed This Encounter  Procedures  . Lipid panel

## 2017-08-17 NOTE — Assessment & Plan Note (Signed)
Goal LDL is less than 70; on aspirin lipid-lowering agent; check cholesterol today

## 2017-08-17 NOTE — Assessment & Plan Note (Signed)
Explained diagnosis; avoid 2nd hand smoke; let me know if symptomatic and we can start medicine

## 2017-08-17 NOTE — Assessment & Plan Note (Signed)
Already seen by cardiologist; reasons to call 911 reviewed; check lipids today; continue statin and aspirin

## 2017-09-11 ENCOUNTER — Ambulatory Visit: Payer: Self-pay

## 2017-09-11 NOTE — Telephone Encounter (Signed)
Pt c/o bilateral calf to foot edema, right greater than left. Pt denies calf pain, fever or redness. Pt stated she thinks the edema is due to poor blood circulation. Pt denies any other sx. Care advice given per protocol. Appt made for tomorrow at 2:40 pm.  Reason for Disposition . [1] MILD swelling of both ankles (i.e., pedal edema) AND [2] new onset or worsening  Answer Assessment - Initial Assessment Questions 1. ONSET: "When did the swelling start?" (e.g., minutes, hours, days)     09/07/17 2. LOCATION: "What part of the leg is swollen?"  "Are both legs swollen or just one leg?"     Calf to foot both legs are swollen the right leg greater than the left 3. SEVERITY: "How bad is the swelling?" (e.g., localized; mild, moderate, severe)  - Localized - small area of swelling localized to one leg  - MILD pedal edema - swelling limited to foot and ankle, pitting edema < 1/4 inch (6 mm) deep, rest and elevation eliminate most or all swelling  - MODERATE edema - swelling of lower leg to knee, pitting edema > 1/4 inch (6 mm) deep, rest and elevation only partially reduce swelling  - SEVERE edema - swelling extends above knee, facial or hand swelling present      mild 4. REDNESS: "Does the swelling look red or infected?"     no 5. PAIN: "Is the swelling painful to touch?" If so, ask: "How painful is it?"   (Scale 1-10; mild, moderate or severe)     no 6. FEVER: "Do you have a fever?" If so, ask: "What is it, how was it measured, and when did it start?"      no 7. CAUSE: "What do you think is causing the leg swelling?"     Poor blood circulation 8. MEDICAL HISTORY: "Do you have a history of heart failure, kidney disease, liver failure, or cancer?"     no 9. RECURRENT SYMPTOM: "Have you had leg swelling before?" If so, ask: "When was the last time?" "What happened that time?"     Yes - 3 months- was given for the swelling - DVT was ruled out 10. OTHER SYMPTOMS: "Do you have any other symptoms?"  (e.g., chest pain, difficulty breathing)       no 11. PREGNANCY: "Is there any chance you are pregnant?" "When was your last menstrual period?"       n/a  Protocols used: LEG SWELLING AND EDEMA-A-AH

## 2017-09-12 ENCOUNTER — Encounter: Payer: Self-pay | Admitting: Nurse Practitioner

## 2017-09-12 ENCOUNTER — Ambulatory Visit (INDEPENDENT_AMBULATORY_CARE_PROVIDER_SITE_OTHER): Payer: Medicare HMO | Admitting: Nurse Practitioner

## 2017-09-12 VITALS — BP 120/80 | HR 82 | Temp 98.0°F | Resp 16 | Ht 68.0 in | Wt 199.1 lb

## 2017-09-12 DIAGNOSIS — R600 Localized edema: Secondary | ICD-10-CM

## 2017-09-12 NOTE — Progress Notes (Addendum)
Name: Dana Peters   MRN: [id number]    DOB: 08/04/1945   Date:09/12/2017       Progress Note  Subjective  Chief Complaint  Chief Complaint  Patient presents with  . Leg Swelling    Onset-1 week, bilateral leg edema. Has been elevating her legs for symptom relief.    HPI  Patient endorses bilateral feet swelling ongoing for one week. States noticed when she put her sneakers on last week and they were very tight. States has been elevating legs with some improvement. More improvement of swelling in the left leg. States does have a lot of salt in her diet. Denies pain in legs. Patient denies shob or chest pain, orthopnea, fatigue, trauma to legs, long travels, doesn't smoke for over 12 years, recent surgerys. Pt + hypertension, controlled, PAD, atherosclerosis and calcification of coronary artery. Has seen Dr. Mariah MillingGollan in the past 09/2015.  Last GFR >60 05/2017 Patient Active Problem List   Diagnosis Date Noted  . Emphysema of lung (HCC) 08/14/2017  . Ganglion cyst of finger 06/25/2017  . Medicare annual wellness visit, subsequent 06/25/2017  . Full code status 06/25/2017  . Baker's cyst of knee, right 06/04/2017  . Thoracic aortic atherosclerosis (HCC) 08/01/2016  . Osteopenia 06/15/2016  . Obesity (BMI 30.0-34.9) 05/29/2016  . Urinary hesitancy 02/14/2016  . PAD (peripheral artery disease) (HCC) 09/09/2015  . Coronary artery calcification seen on CT scan 07/20/2015  . Personal history of tobacco use, presenting hazards to health 07/14/2015  . Preventative health care 06/05/2015  . Medication monitoring encounter 05/07/2015  . Breast cancer screening 03/12/2015  . Foraminal stenosis of lumbar region 01/28/2015  . Facet arthropathy, lumbar 01/28/2015  . Spinal stenosis of lumbar region 01/28/2015  . Neuropathy of both feet 01/07/2015  . Leg pain, bilateral 01/07/2015  . Leg heaviness 01/07/2015  . CKD (chronic kidney disease) stage 2, GFR 60-89 ml/min 10/17/2014  . Allergic  rhinitis 10/14/2014  . Allergic conjunctivitis 10/14/2014  . Dyslipidemia 10/14/2014  . Hypertension   . Sciatica   . Vitamin D deficiency disease   . Acid reflux 02/02/2014  . Neuralgia neuritis, sciatic nerve 08/28/2012    Past Medical History:  Diagnosis Date  . Allergy   . Baker's cyst of knee, right 06/04/2017   US Feb 2019  . CKD (chronic kidney disease) stage 2, GFR 60-89 ml/min   . Dyslipidemia   . GERD (gastroesophageal reflux disease)   . Hypertension   . Lumbar spinal stenosis   . Neuropathy    both feet  . Obesity (BMI 30.0-34.9) 05/29/2016  . Osteopenia 06/15/2016   March 2018  . Personal history of tobacco use, presenting hazards to health 07/14/2015  . Reflux   . Sciatica   . Thoracic aortic atherosclerosis (HCC) 08/01/2016   Chest CT  . Venous stasis   . Vitamin D deficiency disease     Past Surgical History:  Procedure Laterality Date  . ABDOMINAL HYSTERECTOMY     complete, due to heavy  . CATARACT EXTRACTION, BILATERAL    . ECTOPIC PREGNANCY SURGERY      Social History   Tobacco Use  . Smoking status: Former Smoker    Packs/day: 1.00    Years: 30.00    Pack years: 30.00    Types: Cigarettes    Last attempt to quit: 10/13/2005    Years since quitting: 11.9  . Smokeless tobacco: Former NeurosurgeonUser    Types: Snuff    Quit date: 10/09/2011  Substance Use  Topics  . Alcohol use: No    Alcohol/week: 0.0 oz     Current Outpatient Medications:  .  aspirin EC 81 MG tablet, Take 1 tablet (81 mg total) by mouth daily., Disp: 30 tablet, Rfl: 11 .  Cholecalciferol (VITAMIN D) 2000 units CAPS, Take by mouth daily., Disp: , Rfl:  .  DULoxetine (CYMBALTA) 30 MG capsule, TAKE 1 CAPSULE BY MOUTH ONCE DAILY, Disp: 90 capsule, Rfl: 1 .  ezetimibe (ZETIA) 10 MG tablet, Take 1 tablet (10 mg total) daily by mouth., Disp: 90 tablet, Rfl: 3 .  loratadine (CLARITIN) 10 MG tablet, Take 1 tablet (10 mg total) by mouth daily as needed., Disp: 90 tablet, Rfl: 1 .  mometasone  (NASONEX) 50 MCG/ACT nasal spray, Place 2 sprays into the nose as needed. , Disp: , Rfl:  .  montelukast (SINGULAIR) 10 MG tablet, Take 1 tablet (10 mg total) by mouth daily as needed., Disp: 90 tablet, Rfl: 1 .  Spacer/Aero-Holding Chambers (AEROCHAMBER PLUS) inhaler, Use as instructed, Disp: 1 each, Rfl: 2  Allergies  Allergen Reactions  . Atorvastatin Itching  . Codeine Sulfate Itching  . Pravastatin Nausea Only  . Protonix [Pantoprazole Sodium] Other (See Comments)    Legs felt heavy  . Tylenol With Codeine #3  [Acetaminophen-Codeine] Itching  . Penicillin G Rash    ROS   No other specific complaints in a complete review of systems (except as listed in HPI above).  Objective  Vitals:   09/12/17 1222  BP: 120/80  Pulse: 82  Resp: 16  Temp: 98 F (36.7 C)  TempSrc: Oral  SpO2: 96%  Weight: 199 lb 1.6 oz (90.3 kg)  Height: 5\' 8"  (1.727 m)    Body mass index is 30.27 kg/m.  Nursing Note and Vital Signs reviewed.  Physical Exam  Constitutional: Patient appears well-developed and well-nourished. ObeseNo distress.  Cardiovascular: Normal rate, regular rhythm, S1/S2 present.  No murmur or rub heard. Pulses intact, trace edema in left foot, 1+ pitting edema in right foot and edema noted in right calf, no JVD Pulmonary/Chest: Effort normal and breath sounds clear. No respiratory distress or retractions. Abdominal: Soft and non-tender, no ascites Skin: bilateral lower leg smooth and shiny, no redness, bruising or heat noted, non-tender  Psychiatric: Patient has a normal mood and affect. behavior is normal. Judgment and thought content normal.  No results found for this or any previous visit (from the past 72 hour(s)).  Assessment & Plan 1. Leg edema - compression, elevation, low- salt diet.  Pending results will send to specialty as needed. Discussed ER precautions - B Nat Peptide - Urinalysis, Routine w reflex microscopic - Basic Metabolic Panel (BMET) - VAS [name]  LOWER EXTREMITY VENOUS (DVT); Future  Follow up in 2 weeks  -Red flags and when to present for emergency care or RTC including fever >101.79F, chest pain, shortness of breath, new/worsening/un-resolving symptoms, reviewed with patient at time of visit. Follow up and care instructions discussed and provided in AVS.  --------------------------------------------- I have reviewed this encounter including the documentation in this note and/or discussed this patient with the provider, Sharyon Cable DNP AGNP-C. I am certifying that I agree with the content of this note as supervising physician. Baruch Gouty, MD [facility] 09/25/2017, 5:44 PM

## 2017-09-12 NOTE — Patient Instructions (Signed)
-   Limit sodium in diet - Elevate legs when resting - Wear compression stockings - If you have shortness of breath, chest pain please call [phone] and get immediate attention  - We are checking for heart failure, kidney problems and a blood clot in your legs to make sure that your swelling is not caused by one of these issues. If it is we will send you to a specialist.  If you have not heard anything from my staff in a week about any orders/referrals/studies from today, please contact us here to follow-up [phone]   Peripheral Edema Peripheral edema is swelling that is caused by a buildup of fluid. Peripheral edema most often affects the lower legs, ankles, and feet. It can also develop in the arms, hands, and face. The area of the body that has peripheral edema will look swollen. It may also feel heavy or warm. Your clothes may start to feel tight. Pressing on the area may make a temporary dent in your skin. You may not be able to move your arm or leg as much as usual. There are many causes of peripheral edema. It can be a complication of other diseases, such as congestive heart failure, kidney disease, or a problem with your blood circulation. It also can be a side effect of certain medicines. It often happens to women during pregnancy. Sometimes, the cause is not known. Treating the underlying condition is often the only treatment for peripheral edema. Follow these instructions at home: Pay attention to any changes in your symptoms. Take these actions to help with your discomfort:  Raise (elevate) your legs while you are sitting or lying down.  Move around often to prevent stiffness and to lessen swelling. Do not sit or stand for long periods of time.  Wear support stockings as told by your health care provider.  Follow instructions from your health care provider about limiting salt (sodium) in your diet. Sometimes eating less salt can reduce swelling.  Take over-the-counter and  prescription medicines only as told by your health care provider. Your health care provider may prescribe medicine to help your body get rid of excess water (diuretic).  Keep all follow-up visits as told by your health care provider. This is important.  Contact a health care provider if:  You have a fever.  Your edema starts suddenly or is getting worse, especially if you are pregnant or have a medical condition.  You have swelling in only one leg.  You have increased swelling and pain in your legs. Get help right away if:  You develop shortness of breath, especially when you are lying down.  You have pain in your chest or abdomen.  You feel weak.  You faint. This information is not intended to replace advice given to you by your health care provider. Make sure you discuss any questions you have with your health care provider. Document Released: 05/04/2004 Document Revised: 08/30/2015 Document Reviewed: 10/07/2014 Elsevier Interactive Patient Education  [employer]2018 Elsevier Inc[employer]

## 2017-09-13 ENCOUNTER — Telehealth: Payer: Self-pay

## 2017-09-13 ENCOUNTER — Other Ambulatory Visit: Payer: Self-pay | Admitting: Nurse Practitioner

## 2017-09-13 ENCOUNTER — Other Ambulatory Visit: Payer: Self-pay

## 2017-09-13 DIAGNOSIS — M7989 Other specified soft tissue disorders: Secondary | ICD-10-CM

## 2017-09-13 DIAGNOSIS — R600 Localized edema: Secondary | ICD-10-CM

## 2017-09-13 LAB — URINALYSIS, ROUTINE W REFLEX MICROSCOPIC
Bacteria, UA: NONE SEEN /HPF
Bilirubin Urine: NEGATIVE
Glucose, UA: NEGATIVE
Hgb urine dipstick: NEGATIVE
Hyaline Cast: NONE SEEN /LPF
Ketones, ur: NEGATIVE
Nitrite: NEGATIVE
Protein, ur: NEGATIVE
RBC / HPF: NONE SEEN /HPF (ref 0–2)
Specific Gravity, Urine: 1.014 (ref 1.001–1.03)
Squamous Epithelial / HPF: NONE SEEN /HPF
pH: <=5 (ref 5.0–8.0)

## 2017-09-13 LAB — BASIC METABOLIC PANEL WITH GFR
BUN: 15 mg/dL (ref 7–25)
CO2: 30 mmol/L (ref 20–32)
Calcium: 9.6 mg/dL (ref 8.6–10.4)
Chloride: 105 mmol/L (ref 98–110)
Creat: 0.89 mg/dL (ref 0.60–0.93)
Glucose, Bld: 82 mg/dL (ref 65–139)
Potassium: 4.1 mmol/L (ref 3.5–5.3)
Sodium: 142 mmol/L (ref 135–146)

## 2017-09-13 LAB — BRAIN NATRIURETIC PEPTIDE: Brain Natriuretic Peptide: 68 pg/mL

## 2017-09-13 NOTE — Telephone Encounter (Signed)
Copied from CRM [id number] [id number]. Topic: Referral - Question >> Sep 13, 2017 10:19 AM Terisa Starraylor, Brittany L wrote: Reason for CRM: Patient states she was in the office yesterday with Sharyon CableElizabeth Poulose and was told to call the " vein " doctor. She said that she called and they said they need a referral so they know exactly what she is being seen for. She said she does not know the doctor's name or the office.   Call back @ [phone]

## 2017-09-13 NOTE — Telephone Encounter (Signed)
Spoke with scheduling either needs to be changed to Venous US or sent to Vein and Vascular Dr to perform.

## 2017-09-14 NOTE — Progress Notes (Signed)
Patient notified of labs.   

## 2017-09-18 ENCOUNTER — Telehealth: Payer: Self-pay

## 2017-09-18 ENCOUNTER — Other Ambulatory Visit: Payer: Self-pay | Admitting: Nurse Practitioner

## 2017-09-18 DIAGNOSIS — I739 Peripheral vascular disease, unspecified: Secondary | ICD-10-CM

## 2017-09-18 DIAGNOSIS — M79604 Pain in right leg: Secondary | ICD-10-CM

## 2017-09-18 DIAGNOSIS — R600 Localized edema: Secondary | ICD-10-CM

## 2017-09-18 DIAGNOSIS — M79605 Pain in left leg: Secondary | ICD-10-CM

## 2017-09-18 NOTE — Telephone Encounter (Signed)
Copied from CRM [id number]. Topic: General - Other >> Sep 18, 2017 10:30 AM Mcneil, Ja-Kwan wrote: Reason for CRM: Pt  states she was told she would be referred to a Vein Specialist but when called she was told there was no referral. Pt states it is very urgent that she be contacted. Pt request a call back. Cb# [phone]    Please advise

## 2017-09-18 NOTE — Telephone Encounter (Signed)
Plan was for her to get blood work and US and referral to cardiology or vascular to be made based on results. Labs were stable, however US has not yet been completed. Order placed for vascular referral now. Patient is to still follow-up with cardiology, in addition to compression stockings, low-salt diet and elevation of legs when resting.

## 2017-09-21 NOTE — Telephone Encounter (Signed)
Called NA 

## 2017-09-26 ENCOUNTER — Ambulatory Visit (INDEPENDENT_AMBULATORY_CARE_PROVIDER_SITE_OTHER): Payer: Self-pay | Admitting: Vascular Surgery

## 2017-09-27 ENCOUNTER — Encounter (INDEPENDENT_AMBULATORY_CARE_PROVIDER_SITE_OTHER): Payer: Self-pay | Admitting: Vascular Surgery

## 2017-09-27 ENCOUNTER — Ambulatory Visit (INDEPENDENT_AMBULATORY_CARE_PROVIDER_SITE_OTHER): Payer: Medicare HMO | Admitting: Vascular Surgery

## 2017-09-27 VITALS — BP 145/88 | HR 69 | Resp 16 | Ht 68.0 in | Wt 199.0 lb

## 2017-09-27 DIAGNOSIS — R600 Localized edema: Secondary | ICD-10-CM | POA: Diagnosis not present

## 2017-09-27 DIAGNOSIS — I739 Peripheral vascular disease, unspecified: Secondary | ICD-10-CM

## 2017-09-27 NOTE — Progress Notes (Signed)
Subjective:    Patient ID: Dana Peters, female    DOB: 07/07/1945, 72 y.o.   MRN: [id number] Chief Complaint  Patient presents with  . New Patient (Initial Visit)    right le swelling   Presents as a new patient referred by Dr. Olen Cordial for evaluation of lower extremity edema.  The patient notes what sounds like having a Baker's cyst drained to the back of her right knee.  The patient notes that when this was drained the swelling in her leg improved.  The patient experiences intermittent bilateral lower extremity swelling right greater than left.  She notes that there is no pain associated with the swelling.  At this time, the patient denies any claudication-like symptoms, rest pain or ulcer formation to the bilateral lower extremity.  The patient notes that the swelling is worse towards the end of the day.  At this time, the patient does not engage in conservative therapy including wearing medical grade 1 compression stockings or elevating her legs.  The patient denies any recent surgery or trauma to the bilateral lower extremity the patient denies any DVT history to the bilateral lower extremity.  The patient denies any erythema to the bilateral lower extremity.  Patient denies any fever, nausea vomiting.  Review of Systems  Constitutional: Negative.   HENT: Negative.   Eyes: Negative.   Respiratory: Negative.   Cardiovascular: Positive for leg swelling.  Gastrointestinal: Negative.   Endocrine: Negative.   Genitourinary: Negative.   Musculoskeletal: Negative.   Skin: Negative.   Allergic/Immunologic: Negative.   Neurological: Negative.   Hematological: Negative.   Psychiatric/Behavioral: Negative.       Objective:   Physical Exam  Constitutional: She is oriented to person, place, and time. She appears well-developed and well-nourished. No distress.  HENT:  Head: Normocephalic and atraumatic.  Right Ear: External ear normal.  Left Ear: External ear normal.  Eyes: Pupils  are equal, round, and reactive to light. Conjunctivae and EOM are normal.  Neck: Normal range of motion.  Cardiovascular: Normal rate, regular rhythm, normal heart sounds and intact distal pulses.  Pulses:      Radial pulses are 2+ on the right side, and 2+ on the left side.       Dorsalis pedis pulses are 1+ on the right side, and 1+ on the left side.       Posterior tibial pulses are 1+ on the right side, and 1+ on the left side.  Pulmonary/Chest: Effort normal and breath sounds normal.  Musculoskeletal: Normal range of motion. She exhibits edema (Mild nonpitting edema noted to the bilateral lower extremity).  Neurological: She is alert and oriented to person, place, and time.  Skin: Skin is warm and dry. She is not diaphoretic.  No varicosities noted.  No stasis dermatitis, fibrosis, cellulitis or ulcerations noted to the bilateral lower extremity.  Psychiatric: She has a normal mood and affect. Her behavior is normal. Judgment and thought content normal.  Vitals reviewed.  BP (!) 145/88 (BP Location: Right Arm)   Pulse 69   Resp 16   Ht 5\' 8"  (1.727 m)   Wt 199 lb (90.3 kg)   LMP  (LMP Unknown)   BMI 30.26 kg/m   Past Medical History:  Diagnosis Date  . Allergy   . Baker's cyst of knee, right 06/04/2017   [name] Feb 2019  . CKD (chronic kidney disease) stage 2, GFR 60-89 ml/min   . Dyslipidemia   . GERD (gastroesophageal reflux disease)   .  Hypertension   . Lumbar spinal stenosis   . Neuropathy    both feet  . Obesity (BMI 30.0-34.9) 05/29/2016  . Osteopenia 06/15/2016   March 2018  . Personal history of tobacco use, presenting hazards to health 07/14/2015  . Reflux   . Sciatica   . Thoracic aortic atherosclerosis (HCC) 08/01/2016   Chest CT  . Venous stasis   . Vitamin D deficiency disease    Social History   Socioeconomic History  . Marital status: Married    Spouse name: Not on file  . Number of children: Not on file  . Years of education: Not on file  . Highest  education level: Not on file  Occupational History  . Not on file  Social Needs  . Financial resource strain: Not on file  . Food insecurity:    Worry: Not on file    Inability: Not on file  . Transportation needs:    Medical: Not on file    Non-medical: Not on file  Tobacco Use  . Smoking status: Former Smoker    Packs/day: 1.00    Years: 30.00    Pack years: 30.00    Types: Cigarettes    Last attempt to quit: 10/13/2005    Years since quitting: 11.9  . Smokeless tobacco: Former Neurosurgeon    Types: Snuff    Quit date: 10/09/2011  Substance and Sexual Activity  . Alcohol use: No    Alcohol/week: 0.0 oz  . Drug use: No  . Sexual activity: Yes  Lifestyle  . Physical activity:    Days per week: Not on file    Minutes per session: Not on file  . Stress: Not on file  Relationships  . Social connections:    Talks on phone: Not on file    Gets together: Not on file    Attends religious service: Not on file    Active member of club or organization: Not on file    Attends meetings of clubs or organizations: Not on file    Relationship status: Not on file  . Intimate partner violence:    Fear of current or ex partner: Not on file    Emotionally abused: Not on file    Physically abused: Not on file    Forced sexual activity: Not on file  Other Topics Concern  . Not on file  Social History Narrative   Lives with husband in a one story home.  Has one daughter.     Retired from [employer] work.     Education: high school.   Past Surgical History:  Procedure Laterality Date  . ABDOMINAL HYSTERECTOMY     complete, due to heavy  . CATARACT EXTRACTION, BILATERAL    . ECTOPIC PREGNANCY SURGERY     Family History  Problem Relation Age of Onset  . Cancer Mother        colon  . Hypertension Mother   . Alzheimer's disease Mother   . Cancer Father        prostate  . Cancer Brother        lung  . Stroke Brother   . Cancer Brother        prostate and brain  . Cancer Brother         throat  . Diabetes Paternal Aunt   . Breast cancer Paternal Aunt   . Healthy Daughter   . Cirrhosis Brother    Allergies  Allergen Reactions  . Atorvastatin Itching  . Codeine  Sulfate Itching  . Pravastatin Nausea Only  . Protonix [Pantoprazole Sodium] Other (See Comments)    Legs felt heavy  . Tylenol With Codeine #3  [Acetaminophen-Codeine] Itching  . Penicillin G Rash      Assessment & Plan:  Presents as a new patient referred by Dr. Olen Cordial for evaluation of lower extremity edema.  The patient notes what sounds like having a Baker's cyst drained to the back of her right knee.  The patient notes that when this was drained the swelling in her leg improved.  The patient experiences intermittent bilateral lower extremity swelling right greater than left.  She notes that there is no pain associated with the swelling.  At this time, the patient denies any claudication-like symptoms, rest pain or ulcer formation to the bilateral lower extremity.  The patient notes that the swelling is worse towards the end of the day.  At this time, the patient does not engage in conservative therapy including wearing medical grade 1 compression stockings or elevating her legs.  The patient denies any recent surgery or trauma to the bilateral lower extremity the patient denies any DVT history to the bilateral lower extremity.  The patient denies any erythema to the bilateral lower extremity.  Patient denies any fever, nausea vomiting.  1. Bilateral lower extremity edema - New The patient was encouraged to wear graduated compression stockings (20-30 mmHg) on a daily basis. The patient was instructed to begin wearing the stockings first thing in the morning and removing them in the evening. The patient was instructed specifically not to sleep in the stockings. Prescription given.  In addition, behavioral modification including elevation during the day will be initiated. Anti-inflammatories for pain. I will bring  the patient back in 3 months to assess her progress with conservative therapy and at that time have her undergo a bilateral venous duplex to rule out any contributing venous versus lymphatic disease Information on compression stockings was given to the patient. The patient was instructed to call the office in the interim if any worsening edema or ulcerations to the legs, feet or toes occurs. The patient expresses their understanding.  - VAS [name] LOWER EXTREMITY VENOUS REFLUX; Future  2. PAD (peripheral artery disease) (HCC) - Stable Patient is not complaining of any claudication-like symptoms, rest pain or ulceration to the bilateral lower extremity Patient denies any pain with her edema Patient does have risk factors for peripheral artery disease and peripheral artery disease listed as a past medical history issue The patient has not undergone a recent ABI so when she comes back in 3 months I will have her undergo an ABI just to surveilled her peripheral artery disease I have discussed with the patient at length the risk factors for and pathogenesis of atherosclerotic disease and encouraged a healthy diet, regular exercise regimen and blood pressure / glucose control.  The patient was encouraged to call the office in the interim if he experiences any claudication like symptoms, rest pain or ulcers to his feet / toes.  - VAS [name] ABI WITH/WO TBI; Future  Current Outpatient Medications on File Prior to Visit  Medication Sig Dispense Refill  . aspirin EC 81 MG tablet Take 1 tablet (81 mg total) by mouth daily. 30 tablet 11  . Cholecalciferol (VITAMIN D) 2000 units CAPS Take by mouth daily.    . DULoxetine (CYMBALTA) 30 MG capsule TAKE 1 CAPSULE BY MOUTH ONCE DAILY 90 capsule 1  . ezetimibe (ZETIA) 10 MG tablet Take 1 tablet (10  mg total) daily by mouth. 90 tablet 3  . loratadine (CLARITIN) 10 MG tablet Take 1 tablet (10 mg total) by mouth daily as needed. 90 tablet 1  . mometasone (NASONEX) 50  MCG/ACT nasal spray Place 2 sprays into the nose as needed.     . montelukast (SINGULAIR) 10 MG tablet Take 1 tablet (10 mg total) by mouth daily as needed. 90 tablet 1  . Spacer/Aero-Holding Chambers (AEROCHAMBER PLUS) inhaler Use as instructed 1 each 2   No current facility-administered medications on file prior to visit.    There are no Patient Instructions on file for this visit. No follow-ups on file.  Joncarlo Friberg A Thy Gullikson, PA-C

## 2017-12-06 DIAGNOSIS — R69 Illness, unspecified: Secondary | ICD-10-CM | POA: Diagnosis not present

## 2017-12-12 ENCOUNTER — Other Ambulatory Visit: Payer: Self-pay | Admitting: Family Medicine

## 2017-12-12 DIAGNOSIS — Z1231 Encounter for screening mammogram for malignant neoplasm of breast: Secondary | ICD-10-CM

## 2018-01-14 ENCOUNTER — Other Ambulatory Visit: Payer: Self-pay | Admitting: Family Medicine

## 2018-01-14 NOTE — Telephone Encounter (Signed)
Review for refill. 

## 2018-01-14 NOTE — Telephone Encounter (Signed)
Already done

## 2018-01-14 NOTE — Telephone Encounter (Signed)
Copied from CRM [id number]. Topic: Quick Communication - Rx Refill/Question >> Jan 14, 2018  9:41 AM Burchel, Abbi R wrote: Medication: DULoxetine (CYMBALTA) 30 MG capsule  Preferred Pharmacy:  [facility] [address] ([address]), [place] - [address] [place]) [place] [address] Phone: [phone] Fax: [phone]  Pt was advised that RX refills may take up to 3 business days. We ask that you follow-up with your pharmacy.

## 2018-01-21 ENCOUNTER — Ambulatory Visit
Admission: RE | Admit: 2018-01-21 | Discharge: 2018-01-21 | Disposition: A | Discharge status: Home / Self Care | Payer: Medicare HMO | Source: Ambulatory Visit | Attending: Family Medicine | Admitting: Family Medicine

## 2018-01-21 DIAGNOSIS — Z1231 Encounter for screening mammogram for malignant neoplasm of breast: Secondary | ICD-10-CM | POA: Diagnosis present

## 2018-01-24 DIAGNOSIS — R69 Illness, unspecified: Secondary | ICD-10-CM | POA: Diagnosis not present

## 2018-02-20 ENCOUNTER — Other Ambulatory Visit: Payer: Self-pay | Admitting: Family Medicine

## 2018-02-20 NOTE — Telephone Encounter (Signed)
Pt calling to check on the status of this.  States that she has no pills left.

## 2018-02-21 NOTE — Telephone Encounter (Signed)
Patient is inquiring to see if she needs an appt or can this just be called in because she is out.

## 2018-02-21 NOTE — Telephone Encounter (Signed)
Rx sent She does need an appointment though Her last cholesterol was May 2019, so she's due We'll see her soon, but she can pick up her medicine any time; I sent it

## 2018-02-25 ENCOUNTER — Encounter (INDEPENDENT_AMBULATORY_CARE_PROVIDER_SITE_OTHER): Payer: Medicare HMO

## 2018-02-25 ENCOUNTER — Encounter

## 2018-02-25 ENCOUNTER — Ambulatory Visit (INDEPENDENT_AMBULATORY_CARE_PROVIDER_SITE_OTHER): Payer: Medicare HMO | Admitting: Vascular Surgery

## 2018-02-25 NOTE — Telephone Encounter (Signed)
Unable to leave voicemail.

## 2018-03-25 DIAGNOSIS — Z961 Presence of intraocular lens: Secondary | ICD-10-CM | POA: Diagnosis not present

## 2018-03-25 DIAGNOSIS — H26493 Other secondary cataract, bilateral: Secondary | ICD-10-CM | POA: Diagnosis not present

## 2018-04-09 DIAGNOSIS — R69 Illness, unspecified: Secondary | ICD-10-CM | POA: Diagnosis not present

## 2018-04-28 ENCOUNTER — Ambulatory Visit
Admission: EM | Admit: 2018-04-28 | Discharge: 2018-04-28 | Disposition: A | Discharge status: Home / Self Care | Payer: Medicare HMO | Attending: Family Medicine | Admitting: Family Medicine

## 2018-04-28 ENCOUNTER — Other Ambulatory Visit: Payer: Self-pay

## 2018-04-28 ENCOUNTER — Encounter: Payer: Self-pay | Admitting: Gynecology

## 2018-04-28 DIAGNOSIS — J020 Streptococcal pharyngitis: Secondary | ICD-10-CM | POA: Diagnosis not present

## 2018-04-28 LAB — RAPID STREP SCREEN (MED CTR MEBANE ONLY): Streptococcus, Group A Screen (Direct): POSITIVE — AB

## 2018-04-28 MED ORDER — AZITHROMYCIN 250 MG PO TABS: ORAL_TABLET | ORAL | 0 refills | Status: DC

## 2018-04-28 NOTE — ED Triage Notes (Signed)
Patient c/o sore throat x 1 week. 

## 2018-04-28 NOTE — ED Provider Notes (Signed)
[facility]    CSN: [id number] Arrival date & time: 04/28/18  1529     History   Chief Complaint No chief complaint on file.   HPI Dana Peters is a 73 y.o. female.   The history is provided by the patient.  Sore Throat  This is a new problem. The current episode started 2 days ago. The problem occurs constantly. The problem has not changed since onset.Pertinent negatives include no chest pain, no abdominal pain, no headaches and no shortness of breath. She has tried nothing for the symptoms.    Past Medical History:  Diagnosis Date  . Allergy   . Baker's cyst of knee, right 06/04/2017   US Feb 2019  . CKD (chronic kidney disease) stage 2, GFR 60-89 ml/min   . Dyslipidemia   . GERD (gastroesophageal reflux disease)   . Hypertension   . Lumbar spinal stenosis   . Neuropathy    both feet  . Obesity (BMI 30.0-34.9) 05/29/2016  . Osteopenia 06/15/2016   March 2018  . Personal history of tobacco use, presenting hazards to health 07/14/2015  . Reflux   . Sciatica   . Thoracic aortic atherosclerosis (HCC) 08/01/2016   Chest CT  . Venous stasis   . Vitamin D deficiency disease     Patient Active Problem List   Diagnosis Date Noted  . Bilateral lower extremity edema 09/27/2017  . Emphysema of lung (HCC) 08/14/2017  . Ganglion cyst of finger 06/25/2017  . Medicare annual wellness visit, subsequent 06/25/2017  . Full code status 06/25/2017  . Baker's cyst of knee, right 06/04/2017  . Thoracic aortic atherosclerosis (HCC) 08/01/2016  . Osteopenia 06/15/2016  . Obesity (BMI 30.0-34.9) 05/29/2016  . Urinary hesitancy 02/14/2016  . PAD (peripheral artery disease) (HCC) 09/09/2015  . Coronary artery calcification seen on CT scan 07/20/2015  . Personal history of tobacco use, presenting hazards to health 07/14/2015  . Preventative health care 06/05/2015  . Medication monitoring encounter 05/07/2015  . Breast cancer screening 03/12/2015  . Foraminal stenosis  of lumbar region 01/28/2015  . Facet arthropathy, lumbar 01/28/2015  . Spinal stenosis of lumbar region 01/28/2015  . Neuropathy of both feet 01/07/2015  . Leg pain, bilateral 01/07/2015  . Leg heaviness 01/07/2015  . CKD (chronic kidney disease) stage 2, GFR 60-89 ml/min 10/17/2014  . Allergic rhinitis 10/14/2014  . Allergic conjunctivitis 10/14/2014  . Dyslipidemia 10/14/2014  . Hypertension   . Sciatica   . Vitamin D deficiency disease   . Acid reflux 02/02/2014  . Neuralgia neuritis, sciatic nerve 08/28/2012    Past Surgical History:  Procedure Laterality Date  . ABDOMINAL HYSTERECTOMY     complete, due to heavy  . CATARACT EXTRACTION, BILATERAL    . ECTOPIC PREGNANCY SURGERY      OB History   No obstetric history on file.      Home Medications    Prior to Admission medications   Medication Sig Start Date End Date Taking? Authorizing Provider  aspirin EC 81 MG tablet Take 1 tablet (81 mg total) by mouth daily. 05/24/15  Yes Lada, Janit BernMelinda P, MD  Cholecalciferol (VITAMIN D) 2000 units CAPS Take by mouth daily.   Yes [provider]  DULoxetine (CYMBALTA) 30 MG capsule TAKE 1 CAPSULE BY MOUTH ONCE DAILY 01/14/18  Yes Lada, Janit BernMelinda P, MD  ezetimibe (ZETIA) 10 MG tablet Take 1 tablet (10 mg total) by mouth daily. 02/21/18  Yes Lada, Janit BernMelinda P, MD  loratadine (CLARITIN) 10 MG  tablet Take 1 tablet (10 mg total) by mouth daily as needed. 08/23/16  Yes Doren CustardBoyce, Emily E, FNP  mometasone (NASONEX) 50 MCG/ACT nasal spray Place 2 sprays into the nose as needed.    Yes [provider]  montelukast (SINGULAIR) 10 MG tablet Take 1 tablet (10 mg total) by mouth daily as needed. 08/23/16  Yes Doren CustardBoyce, Emily E, FNP  Spacer/Aero-Holding Chambers (AEROCHAMBER PLUS) inhaler Use as instructed 06/24/16  Yes Domenick GongMortenson, Ashley, MD  azithromycin (ZITHROMAX Z-PAK) 250 MG tablet 2 tabs po once day 1, then 1 tab po qd for next 4 days 04/28/18   Payton Mccallumonty, Mayur Duman, MD    Family History Family  History  Problem Relation Age of Onset  . Cancer Mother        colon  . Hypertension Mother   . Alzheimer's disease Mother   . Cancer Father        prostate  . Cancer Brother        lung  . Stroke Brother   . Cancer Brother        prostate and brain  . Cancer Brother        throat  . Diabetes Paternal Aunt   . Breast cancer Paternal Aunt   . Healthy Daughter   . Cirrhosis Brother     Social History Social History   Tobacco Use  . Smoking status: Former Smoker    Packs/day: 1.00    Years: 30.00    Pack years: 30.00    Types: Cigarettes    Last attempt to quit: 10/13/2005    Years since quitting: 12.5  . Smokeless tobacco: Former NeurosurgeonUser    Types: Snuff    Quit date: 10/09/2011  Substance Use Topics  . Alcohol use: No    Alcohol/week: 0.0 standard drinks  . Drug use: No     Allergies   Atorvastatin; Codeine sulfate; Pravastatin; Protonix [pantoprazole sodium]; Tylenol with codeine #3  [acetaminophen-codeine]; and Penicillin g   Review of Systems Review of Systems  Respiratory: Negative for shortness of breath.   Cardiovascular: Negative for chest pain.  Gastrointestinal: Negative for abdominal pain.  Neurological: Negative for headaches.     Physical Exam Triage Vital Signs ED Triage Vitals  Enc Vitals Group     BP 04/28/18 1549 140/82     Pulse Rate 04/28/18 1549 76     Resp 04/28/18 1549 16     Temp 04/28/18 1549 98.6 F (37 C)     Temp Source 04/28/18 1549 Oral     SpO2 04/28/18 1549 96 %     Weight 04/28/18 1551 195 lb (88.5 kg)     Height 04/28/18 1551 5\' 8"  (1.727 m)     Head Circumference --      Peak Flow --      Pain Score 04/28/18 1551 3     Pain Loc --      Pain Edu? --      Excl. in GC? --    No data found.  Updated Vital Signs BP 140/82 (BP Location: Left Arm)   Pulse 76   Temp 98.6 F (37 C) (Oral)   Resp 16   Ht 5\' 8"  (1.727 m)   Wt 88.5 kg   LMP  (LMP Unknown)   SpO2 96%   BMI 29.65 kg/m   Visual Acuity Right Eye  Distance:   Left Eye Distance:   Bilateral Distance:    Right Eye Near:   Left Eye Near:  Bilateral Near:     Physical Exam Vitals signs and nursing note reviewed.  Constitutional:      General: She is not in acute distress.    Appearance: She is not toxic-appearing or diaphoretic.  HENT:     Mouth/Throat:     Pharynx: Oropharyngeal exudate and posterior oropharyngeal erythema present.  Neurological:     Mental Status: She is alert.      UC Treatments / Results  Labs (all labs ordered are listed, but only abnormal results are displayed) Labs Reviewed  RAPID STREP SCREEN ([facility] ONLY) - Abnormal; Notable for the following components:      Result Value   Streptococcus, Group A Screen (Direct) POSITIVE (*)    All other components within normal limits    EKG None  Radiology No results found.  Procedures Procedures (including critical care time)  Medications Ordered in UC Medications - No data to display  Initial Impression / Assessment and Plan / UC Course  I have reviewed the triage vital signs and the nursing notes.  Pertinent labs & imaging results that were available during my care of the patient were reviewed by me and considered in my medical decision making (see chart for details).      Final Clinical Impressions(s) / UC Diagnoses   Final diagnoses:  Strep throat    ED Prescriptions    Medication Sig Dispense Auth. Provider   azithromycin (ZITHROMAX Z-PAK) 250 MG tablet 2 tabs po once day 1, then 1 tab po qd for next 4 days 6 each Payton Mccallum, MD     1. Lab results and diagnosis reviewed with patient 2. rx as per orders above; reviewed possible side effects, interactions, risks and benefits  3. Recommend supportive treatment with salt water gargles, otc analgesics prn  4. Follow-up prn if symptoms worsen or don't improve   Controlled Substance Prescriptions [place] Controlled Substance Registry consulted? Not Applicable   Payton Mccallum, MD 04/28/18 [phone]

## 2018-06-27 ENCOUNTER — Ambulatory Visit (INDEPENDENT_AMBULATORY_CARE_PROVIDER_SITE_OTHER): Payer: Medicare HMO

## 2018-06-27 ENCOUNTER — Other Ambulatory Visit: Payer: Self-pay

## 2018-06-27 VITALS — BP 108/68 | HR 71 | Temp 98.0°F | Resp 16 | Ht 68.0 in | Wt 206.8 lb

## 2018-06-27 DIAGNOSIS — M810 Age-related osteoporosis without current pathological fracture: Secondary | ICD-10-CM | POA: Diagnosis not present

## 2018-06-27 DIAGNOSIS — M8580 Other specified disorders of bone density and structure, unspecified site: Secondary | ICD-10-CM

## 2018-06-27 DIAGNOSIS — Z0000 Encounter for general adult medical examination without abnormal findings: Secondary | ICD-10-CM | POA: Diagnosis not present

## 2018-06-27 DIAGNOSIS — Z780 Asymptomatic menopausal state: Secondary | ICD-10-CM

## 2018-06-27 NOTE — Progress Notes (Signed)
Subjective:   Dana Peters is a 73 y.o. female who presents for Medicare Annual (Subsequent) preventive examination.  Review of Systems:   Cardiac Risk Factors include: advanced age (>22men, >63 women);dyslipidemia;obesity (BMI >30kg/m2)     Objective:     Vitals: BP 108/68 (BP Location: Left Arm, Patient Position: Sitting, Cuff Size: Large)   Pulse 71   Temp 98 F (36.7 C) (Oral)   Resp 16   Ht 5\' 8"  (1.727 m)   Wt 206 lb 12.8 oz (93.8 kg)   LMP  (LMP Unknown)   SpO2 97%   BMI 31.44 kg/m   Body mass index is 31.44 kg/m.  Advanced Directives 06/27/2018 04/28/2018 05/28/2017 01/19/2017 10/26/2016 10/05/2016 08/23/2016  Does Patient Have a Medical Advance Directive? Yes No Yes Yes No No No  Type of Advance Directive Living will;Healthcare Power of Attorney - Living will;Healthcare Power of Attorney - - - -  Copy of Healthcare Power of Attorney in Chart? No - copy requested - - - - - -    Tobacco Social History   Tobacco Use  Smoking Status Former Smoker  . Packs/day: 1.00  . Years: 30.00  . Pack years: 30.00  . Types: Cigarettes  . Last attempt to quit: 10/13/2005  . Years since quitting: 12.7  Smokeless Tobacco Former Neurosurgeon  . Types: Snuff  . Quit date: 10/09/2011     Counseling given: Not Answered   Clinical Intake:  Pre-visit preparation completed: Yes  Pain : 0-10 Pain Score: 3  Pain Type: Acute pain Pain Location: Knee Pain Orientation: Right, Left Pain Descriptors / Indicators: Aching, Discomfort Pain Onset: 1 to 4 weeks ago Pain Frequency: Constant     BMI - recorded: 31.44 Nutritional Status: BMI > 30  Obese Nutritional Risks: None Diabetes: No  How often do you need to have someone help you when you read instructions, pamphlets, or other written materials from your doctor or pharmacy?: 1 - Never  Interpreter Needed?: No  Information entered by :: Reather Littler LPN  Past Medical History:  Diagnosis Date  . Allergy   . Baker's cyst of  knee, right 06/04/2017   [name] Feb 2019  . CKD (chronic kidney disease) stage 2, GFR 60-89 ml/min   . Dyslipidemia   . GERD (gastroesophageal reflux disease)   . Hypertension   . Lumbar spinal stenosis   . Neuropathy    both feet  . Obesity (BMI 30.0-34.9) 05/29/2016  . Osteopenia 06/15/2016   March 2018  . Personal history of tobacco use, presenting hazards to health 07/14/2015  . Reflux   . Sciatica   . Thoracic aortic atherosclerosis (HCC) 08/01/2016   Chest CT  . Venous stasis   . Vitamin D deficiency disease    Past Surgical History:  Procedure Laterality Date  . ABDOMINAL HYSTERECTOMY     complete, due to heavy  . CATARACT EXTRACTION, BILATERAL    . ECTOPIC PREGNANCY SURGERY     Family History  Problem Relation Age of Onset  . Cancer Mother        colon  . Hypertension Mother   . Alzheimer's disease Mother   . Cancer Father        prostate  . Cancer Brother        lung  . Stroke Brother   . Cancer Brother        prostate and brain  . Cancer Brother        throat  . Diabetes Paternal Aunt   .  Breast cancer Paternal Aunt   . Healthy Daughter   . Cirrhosis Brother    Social History   Socioeconomic History  . Marital status: Married    Spouse name: Not on file  . Number of children: 1  . Years of education: Not on file  . Highest education level: 12th grade  Occupational History  . Occupation: retired  Engineer, productionocial Needs  . Financial resource strain: Not very hard  . Food insecurity:    Worry: Never true    Inability: Never true  . Transportation needs:    Medical: No    Non-medical: No  Tobacco Use  . Smoking status: Former Smoker    Packs/day: 1.00    Years: 30.00    Pack years: 30.00    Types: Cigarettes    Last attempt to quit: 10/13/2005    Years since quitting: 12.7  . Smokeless tobacco: Former NeurosurgeonUser    Types: Snuff    Quit date: 10/09/2011  Substance and Sexual Activity  . Alcohol use: No    Alcohol/week: 0.0 standard drinks  . Drug use: No  .  Sexual activity: Yes  Lifestyle  . Physical activity:    Days per week: 0 days    Minutes per session: 0 min  . Stress: Only a little  Relationships  . Social connections:    Talks on phone: More than three times a week    Gets together: Three times a week    Attends religious service: More than 4 times per year    Active member of club or organization: No    Attends meetings of clubs or organizations: Never    Relationship status: Married  Other Topics Concern  . Not on file  Social History Narrative   Lives with husband in a one story home.  Has one daughter.     Retired from [employer] work.     Education: high school.    Outpatient Encounter Medications as of 06/27/2018  Medication Sig  . aspirin EC 81 MG tablet Take 1 tablet (81 mg total) by mouth daily.  . Cholecalciferol (VITAMIN D) 2000 units CAPS Take by mouth daily.  . DULoxetine (CYMBALTA) 30 MG capsule TAKE 1 CAPSULE BY MOUTH ONCE DAILY  . ezetimibe (ZETIA) 10 MG tablet Take 1 tablet (10 mg total) by mouth daily.  Marland Kitchen. loratadine (CLARITIN) 10 MG tablet Take 1 tablet (10 mg total) by mouth daily as needed.  . mometasone (NASONEX) 50 MCG/ACT nasal spray Place 2 sprays into the nose as needed.   . montelukast (SINGULAIR) 10 MG tablet Take 1 tablet (10 mg total) by mouth daily as needed.  . [DISCONTINUED] azithromycin (ZITHROMAX Z-PAK) 250 MG tablet 2 tabs po once day 1, then 1 tab po qd for next 4 days  . [DISCONTINUED] Spacer/Aero-Holding Chambers (AEROCHAMBER PLUS) inhaler Use as instructed   No facility-administered encounter medications on file as of 06/27/2018.     Activities of Daily Living In your present state of health, do you have any difficulty performing the following activities: 06/27/2018 09/12/2017  Hearing? 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 N N  Difficulty concentrating or making decisions? N N  Walking or climbing stairs? N N  Dressing or bathing? N N  Doing errands, shopping? N N   Preparing Food and eating ? N -  Using the Toilet? N -  In the past six months, have you accidently leaked urine? N -  Do you have  problems with loss of bowel control? N -  Managing your Medications? N -  Managing your Finances? N -  Housekeeping or managing your Housekeeping? N -  Some recent data might be hidden    Patient Care Team: Lada, Janit Bern, MD as PCP - General (Family Medicine) Antonieta Iba, MD as Consulting Physician (Cardiology)    Assessment:   This is a routine wellness examination for [place].  Exercise Activities and Dietary recommendations Current Exercise Habits: The patient does not participate in regular exercise at present, Exercise limited by: orthopedic condition(s)(knee pain)  Goals    . Increase physical activity (pt-stated)       Fall Risk Fall Risk  06/27/2018 09/12/2017 06/25/2017 06/04/2017 01/19/2017  Falls in the past year? 0 No No No No  Number falls in past yr: 0 - - - -  Injury with Fall? 0 - - - -  Follow up Falls prevention discussed - - - -   FALL RISK PREVENTION PERTAINING TO THE HOME:  Any stairs in or around the home? Yes  If so, do they handrails? No  only 2 steps outside no handrail  Home free of loose throw rugs in walkways, pet beds, electrical cords, etc? Yes  Adequate lighting in your home to reduce risk of falls? Yes   ASSISTIVE DEVICES UTILIZED TO PREVENT FALLS:  Life alert? No  Use of a cane, walker or w/c? No  Grab bars in the bathroom? No  Shower chair or bench in shower? No  Elevated toilet seat or a handicapped toilet? Yes   DME ORDERS:  DME order needed?  No   TIMED UP AND GO:  Was the test performed? Yes .  Length of time to ambulate 10 feet: 6 sec.   GAIT:  Appearance of gait: Gait stead-fast and without the use of an assistive device.   Education: Fall risk prevention has been discussed.  Intervention(s) required? No   Depression Screen PHQ 2/9 Scores 06/27/2018 09/12/2017 06/25/2017 06/04/2017   PHQ - 2 Score 0 0 0 0     Cognitive Function     6CIT Screen 06/27/2018 06/25/2017 06/15/2016  What Year? 0 points 0 points 0 points  What month? 0 points 0 points 0 points  What time? 0 points 0 points 0 points  Count back from 20 0 points 0 points 0 points  Months in reverse 0 points 2 points 0 points  Repeat phrase 2 points 0 points 2 points  Total Score 2 2 2     Immunization History  Administered Date(s) Administered  . Influenza, High Dose Seasonal PF 01/19/2017  . Influenza, Quadrivalent, Recombinant, Inj, Pf 01/24/2018  . Influenza-Unspecified 03/18/2014, 01/05/2015, 01/03/2016  . Pneumococcal Conjugate-13 11/18/2013  . Pneumococcal Polysaccharide-23 05/15/2012, 01/10/2014  . Td 01/10/2014  . Tdap 11/22/2011  . Zoster 01/17/2012, 05/22/2014    Qualifies for Shingles Vaccine? Yes  Zostavax completed 2016. Due for Shingrix. Education has been provided regarding the importance of this vaccine. Pt has been advised to call insurance company to determine out of pocket expense. Advised may also receive vaccine at local pharmacy or Health Dept. Verbalized acceptance and understanding.  Tdap: Up to date  Flu Vaccine: Up to date  Pneumococcal Vaccine: Up to date   Screening Tests Health Maintenance  Topic Date Due  . DEXA SCAN  07/19/2018  . MAMMOGRAM  01/22/2019  . COLONOSCOPY  07/10/2019  . TETANUS/TDAP  01/11/2024  . INFLUENZA VACCINE  Completed  . Hepatitis C Screening  Completed  . PNA vac Low Risk Adult  Completed   Cancer Screenings:  Colorectal Screening: Completed 07/10/14. Repeat every 5 years.  Mammogram: Completed 01/21/18. Repeat every year.  Bone Density: Completed 07/18/2016. Results reflect  OSTEOPENIA. Repeat every 2 years. Ordered today. Pt provided with contact information and advised to call to schedule appt.   Lung Cancer Screening: (Low Dose CT Chest recommended if Age 40-80 years, 30 pack-year currently smoking OR have quit w/in 15years.) does not  qualify.   Additional Screening:  Hepatitis C Screening: does qualify; Completed 05/07/15  Vision Screening: Recommended annual ophthalmology exams for early detection of glaucoma and other disorders of the eye. Is the patient up to date with their annual eye exam?  Yes  Who is the provider or what is the name of the office in which the pt attends annual eye exams? [facility]  Dental Screening: Recommended annual dental exams for proper oral hygiene  Community Resource Referral:  CRR required this visit?  No      Plan:     I have personally reviewed and addressed the Medicare Annual Wellness questionnaire and have noted the following in the patient's chart:  A. Medical and social history B. Use of alcohol, tobacco or illicit drugs  C. Current medications and supplements D. Functional ability and status E.  Nutritional status F.  Physical activity G. Advance directives H. List of other physicians I.  Hospitalizations, surgeries, and ER visits in previous 12 months J.  Vitals K. Screenings such as hearing and vision if needed, cognitive and depression L. Referrals and appointments   In addition, I have reviewed and discussed with patient certain preventive protocols, quality metrics, and best practice recommendations. A written personalized care plan for preventive services as well as general preventive health recommendations were provided to patient.   Signed,  Reather Littler, LPN Nurse Health Advisor   Nurse Notes:

## 2018-06-27 NOTE — Patient Instructions (Signed)
Dana Peters , Thank you for taking time to come for your Medicare Wellness Visit. I appreciate your ongoing commitment to your health goals. Please review the following plan we discussed and let me know if I can assist you in the future.   Screening recommendations/referrals: Colonoscopy: done 07/10/14. Repeat in 2021. Mammogram: done 01/21/18. Bone Density: done 07/18/16. Please call [phone] to schedule your bone density screening.  Recommended yearly ophthalmology/optometry visit for glaucoma screening and checkup Recommended yearly dental visit for hygiene and checkup  Vaccinations: Influenza vaccine: done 01/24/18 Pneumococcal vaccine: done 01/10/14 Tdap vaccine: done 01/10/14 Shingles vaccine: Shingrix discussed. Please contact your pharmacy for coverage information.   Advanced directives: Please bring a copy of your health care power of attorney and living will to the office at your convenience.  Conditions/risks identified: Free hearing clinics offered in [place]:   [facility] [address], [place], [place] [address] [phone]  H[facility] Special[facility] the [facility] [address] [place], [place], [place] [address] [phone] Next appointment: 07/02/18 8:40 Dr. Sherie Don   Preventive Care 37 Years and Older, Female Preventive care refers to lifestyle choices and visits with your health care provider that can promote health and wellness. What does preventive care include?  A yearly physical exam. This is also called an annual well check.  Dental exams once or twice a year.  Routine eye exams. Ask your health care provider how often you should have your eyes checked.  Personal lifestyle choices, including:  Daily care of your teeth and gums.  Regular physical activity.  Eating a healthy diet.  Avoiding tobacco and drug use.  Limiting alcohol use.  Practicing safe sex.  Taking low-dose aspirin every day.  Taking vitamin and mineral supplements as  recommended by your health care provider. What happens during an annual well check? The services and screenings done by your health care provider during your annual well check will depend on your age, overall health, lifestyle risk factors, and family history of disease. Counseling  Your health care provider may ask you questions about your:  Alcohol use.  Tobacco use.  Drug use.  Emotional well-being.  Home and relationship well-being.  Sexual activity.  Eating habits.  History of falls.  Memory and ability to understand (cognition).  Work and work Astronomer.  Reproductive health. Screening  You may have the following tests or measurements:  Height, weight, and BMI.  Blood pressure.  Lipid and cholesterol levels. These may be checked every 5 years, or more frequently if you are over 75 years old.  Skin check.  Lung cancer screening. You may have this screening every year starting at age 80 if you have a 30-pack-year history of smoking and currently smoke or have quit within the past 15 years.  Fecal occult blood test (FOBT) of the stool. You may have this test every year starting at age 22.  Flexible sigmoidoscopy or colonoscopy. You may have a sigmoidoscopy every 5 years or a colonoscopy every 10 years starting at age 57.  Hepatitis C blood test.  Hepatitis B blood test.  Sexually transmitted disease (STD) testing.  Diabetes screening. This is done by checking your blood sugar (glucose) after you have not eaten for a while (fasting). You may have this done every 1-3 years.  Bone density scan. This is done to screen for osteoporosis. You may have this done starting at age 108.  Mammogram. This may be done every 1-2 years. Talk to your health care provider about  how often you should have regular mammograms. Talk with your health care provider about your test results, treatment options, and if necessary, the need for more tests. Vaccines  Your health care  provider may recommend certain vaccines, such as:  Influenza vaccine. This is recommended every year.  Tetanus, diphtheria, and acellular pertussis (Tdap, Td) vaccine. You may need a Td booster every 10 years.  Zoster vaccine. You may need this after age 57.  Pneumococcal 13-valent conjugate (PCV13) vaccine. One dose is recommended after age 70.  Pneumococcal polysaccharide (PPSV23) vaccine. One dose is recommended after age 47. Talk to your health care provider about which screenings and vaccines you need and how often you need them. This information is not intended to replace advice given to you by your health care provider. Make sure you discuss any questions you have with your health care provider. Document Released: 04/23/2015 Document Revised: 12/15/2015 Document Reviewed: 01/26/2015 Elsevier Interactive Patient Education  2017 [employer] Prevention in the Home Falls can cause injuries. They can happen to people of all ages. There are many things you can do to make your home safe and to help prevent falls. What can I do on the outside of my home?  Regularly fix the edges of walkways and driveways and fix any cracks.  Remove anything that might make you trip as you walk through a door, such as a raised step or threshold.  Trim any bushes or trees on the path to your home.  Use bright outdoor lighting.  Clear any walking paths of anything that might make someone trip, such as rocks or tools.  Regularly check to see if handrails are loose or broken. Make sure that both sides of any steps have handrails.  Any raised decks and porches should have guardrails on the edges.  Have any leaves, snow, or ice cleared regularly.  Use sand or salt on walking paths during winter.  Clean up any spills in your garage right away. This includes oil or grease spills. What can I do in the bathroom?  Use night lights.  Install grab bars by the toilet and in the tub and shower. Do  not use towel bars as grab bars.  Use non-skid mats or decals in the tub or shower.  If you need to sit down in the shower, use a plastic, non-slip stool.  Keep the floor dry. Clean up any water that spills on the floor as soon as it happens.  Remove soap buildup in the tub or shower regularly.  Attach bath mats securely with double-sided non-slip rug tape.  Do not have throw rugs and other things on the floor that can make you trip. What can I do in the bedroom?  Use night lights.  Make sure that you have a light by your bed that is easy to reach.  Do not use any sheets or blankets that are too big for your bed. They should not hang down onto the floor.  Have a firm chair that has side arms. You can use this for support while you get dressed.  Do not have throw rugs and other things on the floor that can make you trip. What can I do in the kitchen?  Clean up any spills right away.  Avoid walking on wet floors.  Keep items that you use a lot in easy-to-reach places.  If you need to reach something above you, use a strong step stool that has a grab bar.  Keep  electrical cords out of the way.  Do not use floor polish or wax that makes floors slippery. If you must use wax, use non-skid floor wax.  Do not have throw rugs and other things on the floor that can make you trip. What can I do with my stairs?  Do not leave any items on the stairs.  Make sure that there are handrails on both sides of the stairs and use them. Fix handrails that are broken or loose. Make sure that handrails are as long as the stairways.  Check any carpeting to make sure that it is firmly attached to the stairs. Fix any carpet that is loose or worn.  Avoid having throw rugs at the top or bottom of the stairs. If you do have throw rugs, attach them to the floor with carpet tape.  Make sure that you have a light switch at the top of the stairs and the bottom of the stairs. If you do not have them,  ask someone to add them for you. What else can I do to help prevent falls?  Wear shoes that:  Do not have high heels.  Have rubber bottoms.  Are comfortable and fit you well.  Are closed at the toe. Do not wear sandals.  If you use a stepladder:  Make sure that it is fully opened. Do not climb a closed stepladder.  Make sure that both sides of the stepladder are locked into place.  Ask someone to hold it for you, if possible.  Clearly mark and make sure that you can see:  Any grab bars or handrails.  First and last steps.  Where the edge of each step is.  Use tools that help you move around (mobility aids) if they are needed. These include:  Canes.  Walkers.  Scooters.  Crutches.  Turn on the lights when you go into a dark area. Replace any light bulbs as soon as they burn out.  Set up your furniture so you have a clear path. Avoid moving your furniture around.  If any of your floors are uneven, fix them.  If there are any pets around you, be aware of where they are.  Review your medicines with your doctor. Some medicines can make you feel dizzy. This can increase your chance of falling. Ask your doctor what other things that you can do to help prevent falls. This information is not intended to replace advice given to you by your health care provider. Make sure you discuss any questions you have with your health care provider. Document Released: 01/21/2009 Document Revised: 09/02/2015 Document Reviewed: 05/01/2014 Elsevier Interactive Patient Education  2017 [employer].

## 2018-06-29 ENCOUNTER — Encounter: Payer: Self-pay | Admitting: *Deleted

## 2018-07-02 ENCOUNTER — Other Ambulatory Visit: Payer: Self-pay

## 2018-07-02 ENCOUNTER — Ambulatory Visit (INDEPENDENT_AMBULATORY_CARE_PROVIDER_SITE_OTHER): Payer: Medicare HMO | Admitting: Nurse Practitioner

## 2018-07-02 ENCOUNTER — Encounter: Payer: Self-pay | Admitting: Nurse Practitioner

## 2018-07-02 VITALS — BP 130/78 | HR 78 | Temp 98.5°F | Resp 14 | Ht 67.0 in | Wt 207.0 lb

## 2018-07-02 DIAGNOSIS — H6123 Impacted cerumen, bilateral: Secondary | ICD-10-CM

## 2018-07-02 DIAGNOSIS — Z0000 Encounter for general adult medical examination without abnormal findings: Secondary | ICD-10-CM | POA: Diagnosis not present

## 2018-07-02 DIAGNOSIS — I700 Atherosclerosis of aorta: Secondary | ICD-10-CM | POA: Diagnosis not present

## 2018-07-02 DIAGNOSIS — E785 Hyperlipidemia, unspecified: Secondary | ICD-10-CM

## 2018-07-02 DIAGNOSIS — J301 Allergic rhinitis due to pollen: Secondary | ICD-10-CM

## 2018-07-02 DIAGNOSIS — Z131 Encounter for screening for diabetes mellitus: Secondary | ICD-10-CM

## 2018-07-02 MED ORDER — LORATADINE 10 MG PO TABS: 10.0000 mg | ORAL_TABLET | Freq: Every day | ORAL | 1 refills | Status: DC | PRN

## 2018-07-02 NOTE — Progress Notes (Signed)
Name: Dana Peters   MRN: [id number]    DOB: 1946-03-26   Date:07/02/2018       Progress Note  Subjective  Chief Complaint  Chief Complaint  Patient presents with  . Annual Exam    HPI   Patient presents for annual CPE.  Diet:  Tries to eat a lot of vegetables, and makes a lot of salads Meats: chicken and beef At least 2 meals a day Drinks- a lot of water- 16 ounces of water, sweet tea 1-2 glasses if she does drink it maybe a few times a week Exercise:  Is active in her garden.   USPSTF grade A and B recommendations   Depression:  Depression screen [facility] 2/9 06/27/2018 09/12/2017 06/25/2017 06/04/2017 01/19/2017  Decreased Interest 0 0 0 0 0  Down, Depressed, Hopeless 0 0 0 0 0  PHQ - 2 Score 0 0 0 0 0   Hypertension: BP Readings from Last 3 Encounters:  07/02/18 130/78  06/27/18 108/68  04/28/18 140/82   Obesity: Wt Readings from Last 3 Encounters:  07/02/18 207 lb (93.9 kg)  06/27/18 206 lb 12.8 oz (93.8 kg)  04/28/18 195 lb (88.5 kg)   BMI Readings from Last 3 Encounters:  07/02/18 32.42 kg/m  06/27/18 31.44 kg/m  04/28/18 29.65 kg/m    Hep C Screening: up to day  STD testing and prevention (HIV/chl/gon/syphilis): declines  Intimate partner violence: denies  Sexual History/Pain during Intercourse: denies  Menstrual History/LMP/Abnormal Bleeding: denies Incontinence Symptoms: denies   Advanced Care Planning: A voluntary discussion about advance care planning including the explanation and discussion of advance directives.  Discussed health care proxy and Living will, and the patient was able to identify a health care proxy as husband- Dana Peters.  Patient does have a living will at present time. If patient does have living will, I have requested they bring this to the clinic to be scanned in to their chart. Has one at home but doesn't have it notarized.   Breast cancer: last mammogram completed on 01/21/2018- No mammographic evidence of  malignancy Cervical cancer screening: total hysterectomy   Osteoporosis Screening:  HM Dexa Scan  Date Value Ref Range Status  07/18/2016 abnormal  Final    Comment:    osteopenis multiple spots    Lipids:  Lab Results  Component Value Date   CHOL 155 08/17/2017   CHOL 166 10/26/2016   CHOL 152 11/09/2015   Lab Results  Component Value Date   HDL 48 (L) 08/17/2017   HDL 51 10/26/2016   HDL 56 11/09/2015   Lab Results  Component Value Date   LDLCALC 74 08/17/2017   LDLCALC 76 10/26/2016   LDLCALC 74 11/09/2015   Lab Results  Component Value Date   TRIG 251 (H) 08/17/2017   TRIG 197 (H) 10/26/2016   TRIG 112 11/09/2015   Lab Results  Component Value Date   CHOLHDL 3.2 08/17/2017   CHOLHDL 3.3 10/26/2016   CHOLHDL 2.7 11/09/2015   No results found for: LDLDIRECT  Glucose:  Glucose, Bld  Date Value Ref Range Status  09/12/2017 82 65 - 139 mg/dL Final    Comment:    .        Non-fasting reference interval .   05/28/2017 107 (H) 65 - 99 mg/dL Final  00/17/4944 967 (H) 65 - 99 mg/dL Final    Skin cancer: discussed with patient.  Colorectal cancer: completed in 2016; next due on 2021 Lung cancer: Low Dose CT Chest  recommended if Age 26-80 years, 30 pack-year currently smoking OR have quit w/in 15years. Patient does qualify.  Ordered, likely to be rescheduled due to pandemic  Patient Active Problem List   Diagnosis Date Noted  . Bilateral lower extremity edema 09/27/2017  . Emphysema of lung (HCC) 08/14/2017  . Ganglion cyst of finger 06/25/2017  . Medicare annual wellness visit, subsequent 06/25/2017  . Full code status 06/25/2017  . Osteoarthritis of knee 06/12/2017  . Baker's cyst of knee, right 06/04/2017  . Thoracic aortic atherosclerosis (HCC) 08/01/2016  . Osteopenia 06/15/2016  . Obesity (BMI 30.0-34.9) 05/29/2016  . Urinary hesitancy 02/14/2016  . PAD (peripheral artery disease) (HCC) 09/09/2015  . Coronary artery calcification seen on CT scan  07/20/2015  . Personal history of tobacco use, presenting hazards to health 07/14/2015  . Preventative health care 06/05/2015  . Medication monitoring encounter 05/07/2015  . Breast cancer screening 03/12/2015  . Foraminal stenosis of lumbar region 01/28/2015  . Facet arthropathy, lumbar 01/28/2015  . Spinal stenosis of lumbar region 01/28/2015  . Neuropathy of both feet 01/07/2015  . Leg pain, bilateral 01/07/2015  . Leg heaviness 01/07/2015  . CKD (chronic kidney disease) stage 2, GFR 60-89 ml/min 10/17/2014  . Allergic rhinitis 10/14/2014  . Allergic conjunctivitis 10/14/2014  . Dyslipidemia 10/14/2014  . Hypertension   . Vitamin D deficiency disease   . GERD (gastroesophageal reflux disease) 02/02/2014  . Neuralgia neuritis, sciatic nerve 08/28/2012  . Sciatica of left side 08/28/2012    Past Surgical History:  Procedure Laterality Date  . ABDOMINAL HYSTERECTOMY     complete, due to heavy  . CATARACT EXTRACTION, BILATERAL    . ECTOPIC PREGNANCY SURGERY      Family History  Problem Relation Age of Onset  . Cancer Mother        colon  . Hypertension Mother   . Alzheimer's disease Mother   . Cancer Father        prostate  . Cancer Brother        lung  . Stroke Brother   . Cancer Brother        prostate and brain  . Cancer Brother        throat  . Diabetes Paternal Aunt   . Breast cancer Paternal Aunt   . Healthy Daughter   . Cirrhosis Brother     Social History   Socioeconomic History  . Marital status: Married    Spouse name: Not on file  . Number of children: 1  . Years of education: Not on file  . Highest education level: 12th grade  Occupational History  . Occupation: retired  Engineer, production  . Financial resource strain: Not very hard  . Food insecurity:    Worry: Never true    Inability: Never true  . Transportation needs:    Medical: No    Non-medical: No  Tobacco Use  . Smoking status: Former Smoker    Packs/day: 1.00    Years: 30.00     Pack years: 30.00    Types: Cigarettes    Last attempt to quit: 10/13/2005    Years since quitting: 12.7  . Smokeless tobacco: Former Neurosurgeon    Types: Snuff    Quit date: 10/09/2011  Substance and Sexual Activity  . Alcohol use: No    Alcohol/week: 0.0 standard drinks  . Drug use: No  . Sexual activity: Yes  Lifestyle  . Physical activity:    Days per week: 0 days  Minutes per session: 0 min  . Stress: Only a little  Relationships  . Social connections:    Talks on phone: More than three times a week    Gets together: Three times a week    Attends religious service: More than 4 times per year    Active member of club or organization: No    Attends meetings of clubs or organizations: Never    Relationship status: Married  . Intimate partner violence:    Fear of current or ex partner: No    Emotionally abused: No    Physically abused: No    Forced sexual activity: No  Other Topics Concern  . Not on file  Social History Narrative   Lives with husband in a one story home.  Has one daughter.     Retired from [employer] work.     Education: high school.     Current Outpatient Medications:  .  aspirin EC 81 MG tablet, Take 1 tablet (81 mg total) by mouth daily., Disp: 30 tablet, Rfl: 11 .  Cholecalciferol (VITAMIN D) 2000 units CAPS, Take by mouth daily., Disp: , Rfl:  .  DULoxetine (CYMBALTA) 30 MG capsule, TAKE 1 CAPSULE BY MOUTH ONCE DAILY, Disp: 90 capsule, Rfl: 1 .  ezetimibe (ZETIA) 10 MG tablet, Take 1 tablet (10 mg total) by mouth daily., Disp: 90 tablet, Rfl: 3 .  loratadine (CLARITIN) 10 MG tablet, Take 1 tablet (10 mg total) by mouth daily as needed., Disp: 90 tablet, Rfl: 1 .  mometasone (NASONEX) 50 MCG/ACT nasal spray, Place 2 sprays into the nose as needed. , Disp: , Rfl:  .  montelukast (SINGULAIR) 10 MG tablet, Take 1 tablet (10 mg total) by mouth daily as needed., Disp: 90 tablet, Rfl: 1  Allergies  Allergen Reactions  . Atorvastatin Itching  . Codeine Sulfate  Itching  . Pravastatin Nausea Only  . Protonix [Pantoprazole Sodium] Other (See Comments)    Legs felt heavy  . Tylenol With Codeine #3  [Acetaminophen-Codeine] Itching  . Penicillin G Rash     Review of Systems  Constitutional: Negative for chills, fever and malaise/fatigue.  HENT: Negative for congestion, sinus pain, sore throat and tinnitus.   Eyes: Negative for blurred vision and double vision.  Respiratory: Negative for cough and shortness of breath.   Cardiovascular: Negative for chest pain, palpitations and leg swelling.  Gastrointestinal: Negative for abdominal pain, constipation, diarrhea and nausea.  Genitourinary: Negative for dysuria and hematuria.  Musculoskeletal: Positive for joint pain (chronic). Negative for back pain and falls.  Skin: Negative for rash.  Neurological: Negative for dizziness, focal weakness, weakness and headaches.  Endo/Heme/Allergies: Negative for polydipsia.  Psychiatric/Behavioral: Negative for depression. The patient is not nervous/anxious and does not have insomnia.      Objective  Vitals:   07/02/18 0839  BP: 130/78  Pulse: 78  Resp: 14  Temp: 98.5 F (36.9 C)  TempSrc: Oral  SpO2: 99%  Weight: 207 lb (93.9 kg)  Height:  (1.702 m)    Body mass index is 32.42 kg/m.  Physical Exam Constitutional: Patient appears well-developed and well-nourished. No distress.  HENT: Head: Normocephalic and atraumatic. Ears: B TMs ok, no erythema or effusion (after cerumen disimpaction); Nose: Nose normal. Mouth/Throat: Oropharynx is clear and moist. No oropharyngeal exudate.  Eyes: Conjunctivae and EOM are normal. Pupils are equal, round, and reactive to light. No scleral icterus.  Neck: Normal range of motion. Neck supple. No JVD present. No thyromegaly present.  Cardiovascular: Normal rate, regular rhythm and normal heart sounds.  No murmur heard. No BLE edema. Pulmonary/Chest: Effort normal and breath sounds normal. No respiratory  distress. Abdominal: Soft. Bowel sounds are normal, no distension. There is no tenderness. no masses Breast: no lumps or masses, no nipple discharge or rashes, right nupple inverted for decades per patient- has had ultrasounds in the past.  FEMALE GENITALIA: defferred Musculoskeletal: Normal range of motion, no joint effusions. No gross deformities Neurological: he is alert and oriented to person, place, and time. No cranial nerve deficit. Coordination, balance, strength, speech and gait are normal.  Skin: Skin is warm and dry. No rash noted. No erythema.  Psychiatric: Patient has a normal mood and affect. behavior is normal. Judgment and thought content normal.    Recent Results (from the past 2160 hour(s))  Rapid Strep Screen ([facility] ONLY)     Status: Abnormal   Collection Time: 04/28/18  3:54 PM  Result Value Ref Range   Streptococcus, Group A Screen (Direct) POSITIVE (A) NEGATIVE    Comment: Performed at [facility], [address] [place], [place] [address]    Fall Risk: Fall Risk  06/27/2018 09/12/2017 06/25/2017 06/04/2017 01/19/2017  Falls in the past year? 0 No No No No  Number falls in past yr: 0 - - - -  Injury with Fall? 0 - - - -  Follow up Falls prevention discussed - - - -     Assessment & Plan  1. Preventative health care - 1. Preventative health care - Lipid Profile - COMPLETE METABOLIC PANEL WITH GFR  2. Seasonal allergic rhinitis due to pollen - loratadine (CLARITIN) 10 MG tablet; Take 1 tablet (10 mg total) by mouth daily as needed.  Dispense: 90 tablet; Refill: 1  3. Thoracic aortic atherosclerosis (HCC) - Lipid Profile  4. Dyslipidemia - Lipid Profile  5. Screening for diabetes mellitus - COMPLETE METABOLIC PANEL WITH GFR   -USPSTF grade A and B recommendations reviewed with patient; age-appropriate recommendations, preventive care, screening tests, etc discussed and encouraged; healthy living encouraged; see AVS for patient  education given to patient -Discussed importance of 150 minutes of physical activity weekly, eat two servings of fish weekly, eat one serving of tree nuts ( cashews, pistachios, pecans, almonds.Marland Kitchen) every other day, eat 6 servings of fruit/vegetables daily and drink plenty of water and avoid sweet beverages.

## 2018-07-02 NOTE — Patient Instructions (Addendum)
General recommendations: 150 minutes of physical activity weekly, eat two servings of fish weekly, eat one serving of tree nuts ( cashews, pistachios, pecans, almonds.Marland Kitchen) every other day, eat 6 servings of fruit/vegetables daily and drink plenty of water and avoid sweet beverages.  - Try to slowly increase the amount of water, the daily recommendation is 64 ounces a day and slowly work your way back up.   - Your mammogram and bone density scan are due in October- Please do call to schedule your mammogram closer to that time; the number to schedule one at either [facility] or [facility] is [phone]. - You are also due for a lung cancer CT screening in April, this should be rescheduled till after the pandemic resolved, please contact them if you do not hear from them afterwards.   - Ask your insurance company about [employer] coverage.   1. Take supplementation - Vitamin D at least 1000 IU, and Calcium at least 645m.   2. Keep moving Physical activity can go a long way toward fall prevention. With your doctor's OK, consider activities such as walking, water workouts or tai chi - a gentle exercise that involves slow and graceful dance-like movements. Such activities reduce the risk of falls by improving strength, balance, coordination and flexibility. If you avoid physical activity because you're afraid it will make a fall more likely, tell your doctor. He or she may recommend carefully monitored exercise programs or refer you to a physical therapist. The physical therapist can create a custom exercise program aimed at improving your balance, flexibility, muscle strength and gait. 3. Wear sensible shoes Consider changing your footwear as part of your fall-prevention plan. High heels, floppy slippers and shoes with slick soles can make you slip, stumble and fall. So can walking in your stocking feet. Instead, wear properly fitting, sturdy shoes with nonskid soles. Sensible  shoes may also reduce joint pain.  4. Remove home hazards Take a look around your home. Your living room, kitchen, bedroom, bathroom, hallways and stairways may be filled with hazards. To make your home safer: . Remove boxes, newspapers, electrical cords and phone cords from walkways.  . Move coffee tables, magazine racks and plant stands from high-traffic areas.  . Secure loose rugs with double-faced tape, tacks or a slip-resistant backing - or remove loose rugs from your home.  . Repair loose, wooden floorboards and carpeting right away.  . Store clothing, dishes, food and other necessities within easy reach.  . Immediately clean spilled liquids, grease or food.  . Use nonslip mats in your bathtub or shower. Use a bath seat, which allows you to sit while showering. 5. Light up your living space Keep your home brightly lit to avoid tripping on objects that are hard to see. Also: . Place night lights in your bedroom, bathroom and hallways.  . Place a lamp within reach of your bed for middle-of-the-night needs.  . Make clear paths to light switches that aren't near room entrances. Consider trading traditional switches for glow-in-the-dark or illuminated switches.  . Turn on the lights before going up or down stairs.  . Store flashlights in easy-to-find places in case of power outages. 6. Use assistive devices Your doctor might recommend using a cane or walker to keep you steady. Other assistive devices can help, too. For example: . Hand rails for both sides of stairways  . Nonslip treads for bare-wood steps  . A raised toilet seat or one with armrests  .  Grab bars for the shower or tub  . A sturdy plastic seat for the shower or tub - plus a hand-held shower nozzle for bathing while sitting down If necessary, ask your doctor for a referral to an occupational therapist. He or she can help you brainstorm other fall-prevention strategies. Some solutions are easily installed and relatively  inexpensive. Others may require professional help or a larger investment. If you're concerned about the cost, remember that an investment in fall prevention is an investment in your independence.

## 2018-07-03 LAB — COMPLETE METABOLIC PANEL WITHOUT GFR
AG Ratio: 1.5 (calc) (ref 1.0–2.5)
ALT: 16 U/L (ref 6–29)
AST: 18 U/L (ref 10–35)
Albumin: 4 g/dL (ref 3.6–5.1)
Alkaline phosphatase (APISO): 72 U/L (ref 37–153)
BUN: 16 mg/dL (ref 7–25)
CO2: 31 mmol/L (ref 20–32)
Calcium: 9.5 mg/dL (ref 8.6–10.4)
Chloride: 103 mmol/L (ref 98–110)
Creat: 0.89 mg/dL (ref 0.60–0.93)
GFR, Est African American: 75 mL/min/{1.73_m2}
GFR, Est Non African American: 64 mL/min/{1.73_m2}
Globulin: 2.7 g/dL (ref 1.9–3.7)
Glucose, Bld: 97 mg/dL (ref 65–99)
Potassium: 4.5 mmol/L (ref 3.5–5.3)
Sodium: 141 mmol/L (ref 135–146)
Total Bilirubin: 0.4 mg/dL (ref 0.2–1.2)
Total Protein: 6.7 g/dL (ref 6.1–8.1)

## 2018-07-03 LAB — LIPID PANEL
Cholesterol: 147 mg/dL
HDL: 52 mg/dL
LDL Cholesterol (Calc): 75 mg/dL
Non-HDL Cholesterol (Calc): 95 mg/dL
Total CHOL/HDL Ratio: 2.8 (calc)
Triglycerides: 110 mg/dL

## 2018-07-04 ENCOUNTER — Encounter: Payer: Self-pay | Admitting: *Deleted

## 2018-07-10 ENCOUNTER — Other Ambulatory Visit: Payer: Self-pay | Admitting: Family Medicine

## 2018-09-11 ENCOUNTER — Telehealth: Payer: Self-pay | Admitting: *Deleted

## 2018-09-11 DIAGNOSIS — Z87891 Personal history of nicotine dependence: Secondary | ICD-10-CM

## 2018-09-11 DIAGNOSIS — Z122 Encounter for screening for malignant neoplasm of respiratory organs: Secondary | ICD-10-CM

## 2018-09-11 NOTE — Telephone Encounter (Signed)
Patient has been notified that annual lung cancer screening low dose CT scan is due currently or will be in near future. Confirmed that patient is within the age range of 55-77, and asymptomatic, (no signs or symptoms of lung cancer). Patient denies illness that would prevent curative treatment for lung cancer if found. Verified smoking history, (former, quit 2007, 30 pack year). The shared decision making visit was done 07/14/15. Patient is agreeable for CT scan being scheduled.

## 2018-09-17 ENCOUNTER — Ambulatory Visit
Admission: RE | Admit: 2018-09-17 | Discharge: 2018-09-17 | Disposition: A | Discharge status: Home / Self Care | Payer: Medicare HMO | Source: Ambulatory Visit | Attending: Nurse Practitioner | Admitting: Nurse Practitioner

## 2018-09-17 ENCOUNTER — Other Ambulatory Visit: Payer: Self-pay

## 2018-09-17 DIAGNOSIS — Z122 Encounter for screening for malignant neoplasm of respiratory organs: Secondary | ICD-10-CM | POA: Diagnosis present

## 2018-09-17 DIAGNOSIS — Z87891 Personal history of nicotine dependence: Secondary | ICD-10-CM | POA: Insufficient documentation

## 2018-09-18 ENCOUNTER — Encounter: Payer: Self-pay | Admitting: *Deleted

## 2018-10-08 DIAGNOSIS — R69 Illness, unspecified: Secondary | ICD-10-CM | POA: Diagnosis not present

## 2018-10-10 IMAGING — MG MM DIGITAL SCREENING BILAT W/ TOMO W/ CAD
8 of 14 series · 8 of 30 positions shown · non-contrast
Comparison: Previous exam(s).

CLINICAL DATA: Screening.

EXAM:
2D DIGITAL SCREENING BILATERAL MAMMOGRAM WITH CAD AND ADJUNCT TOMO

[R MLO (1 of 2)]
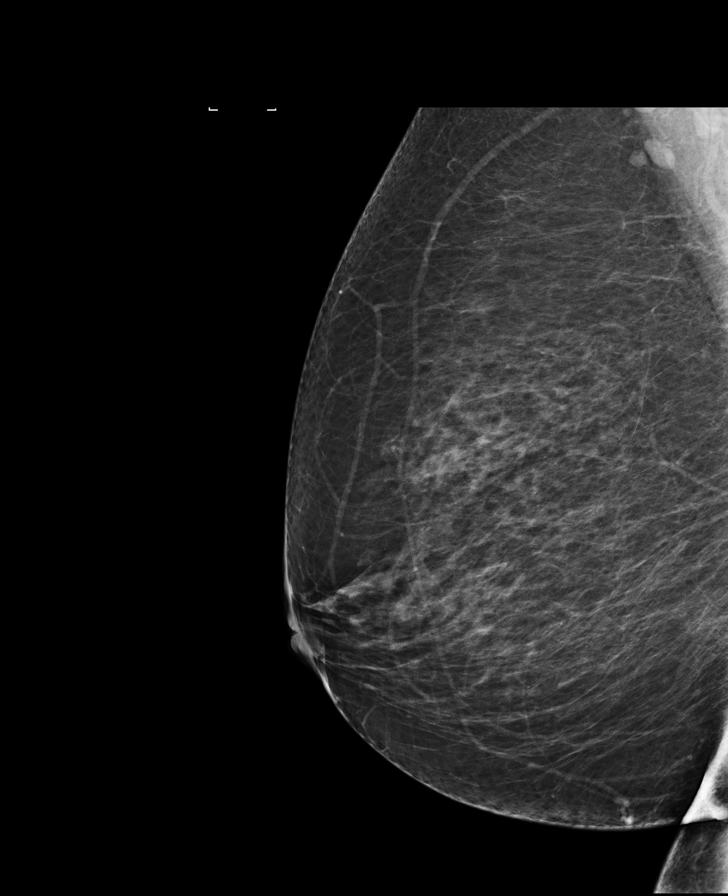

[L MLO (1 of 2)]
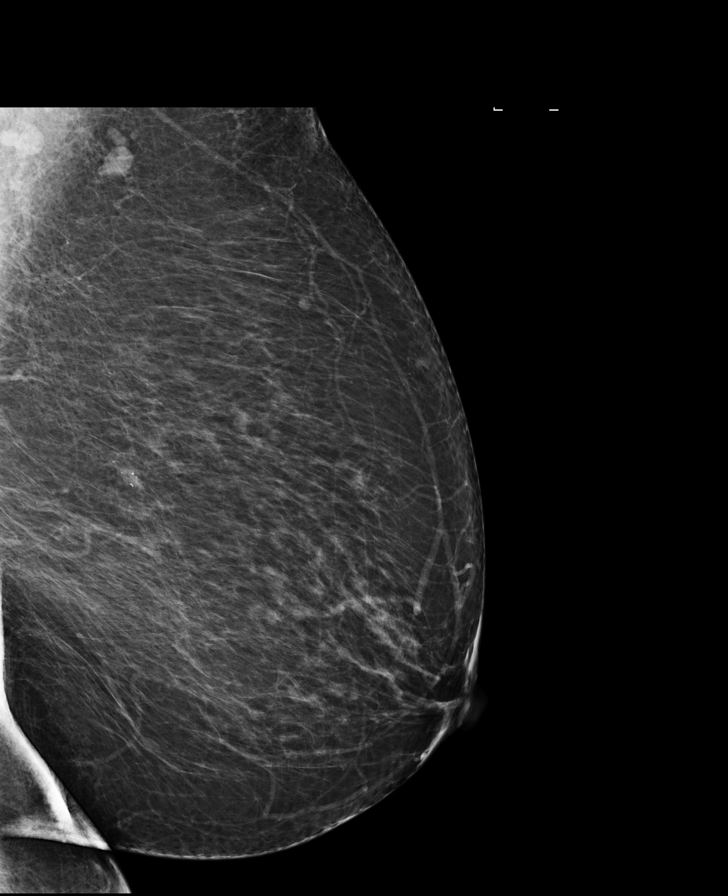

[R CC synth-2D]
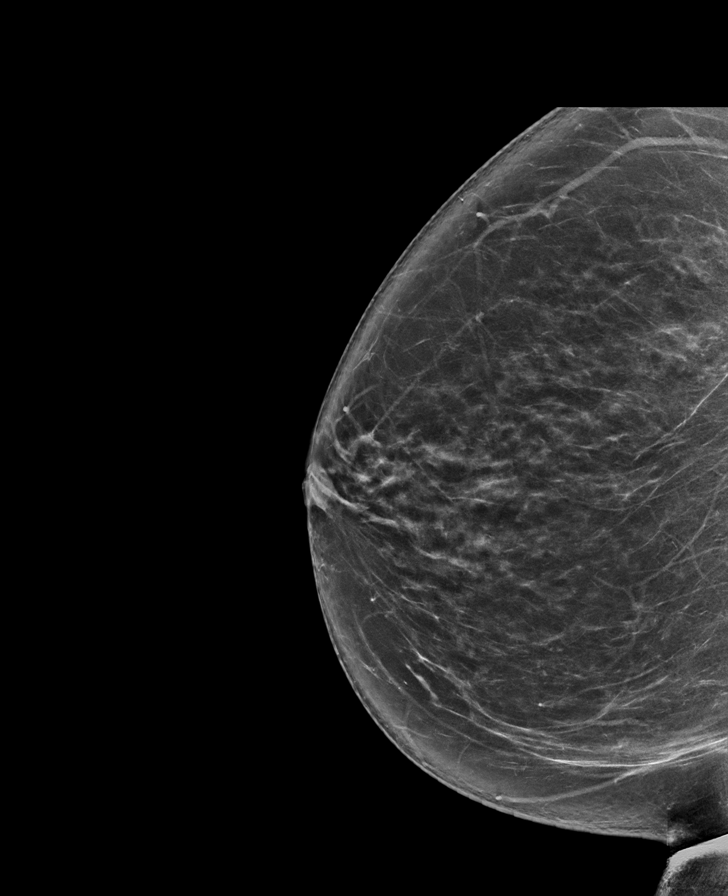

[R MLO (2 of 2)]
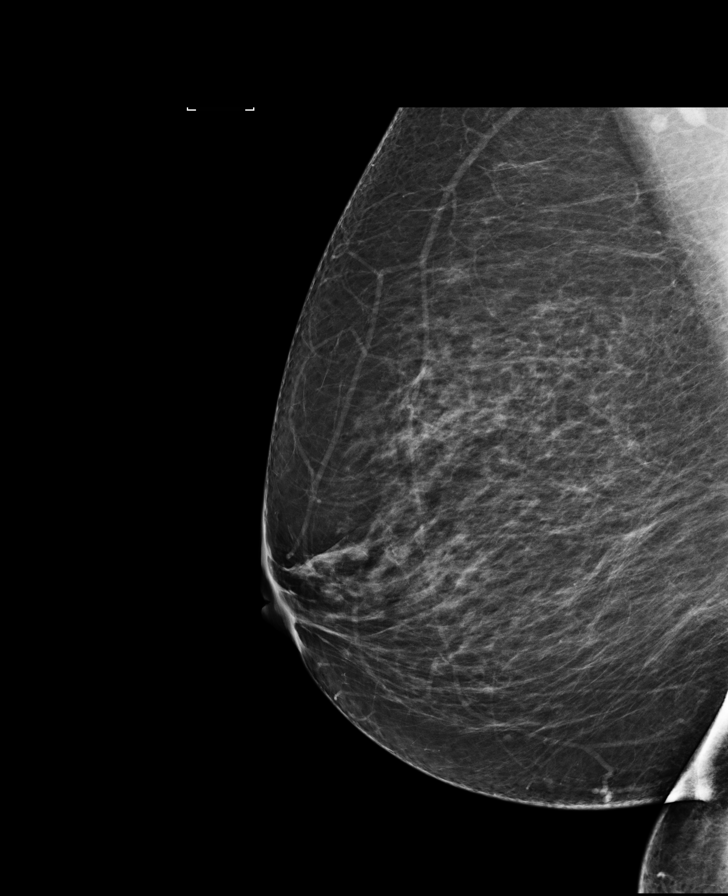

[L CC synth-2D]
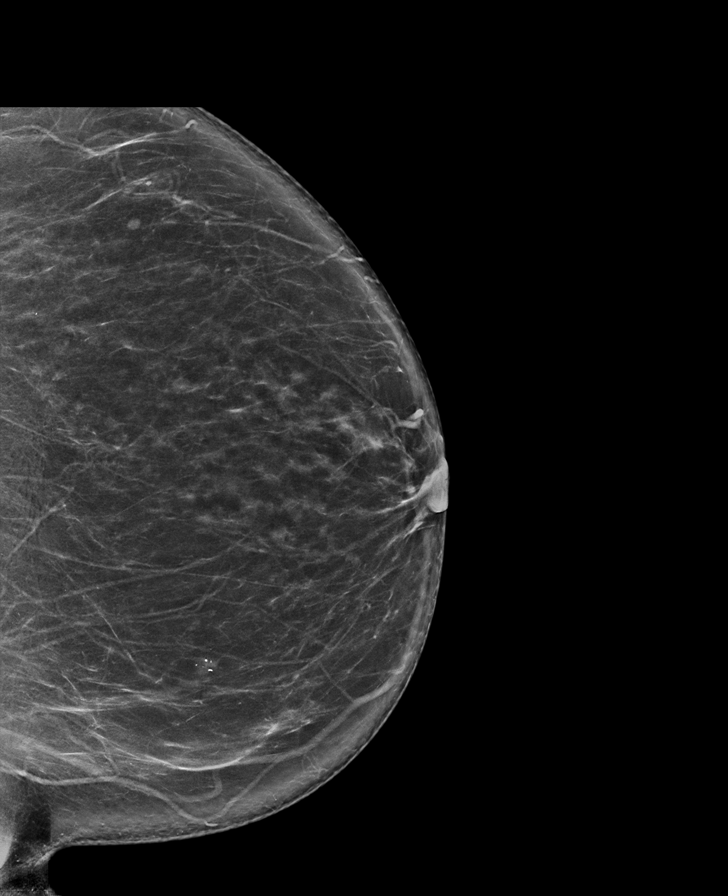

[L MLO (2 of 2)]
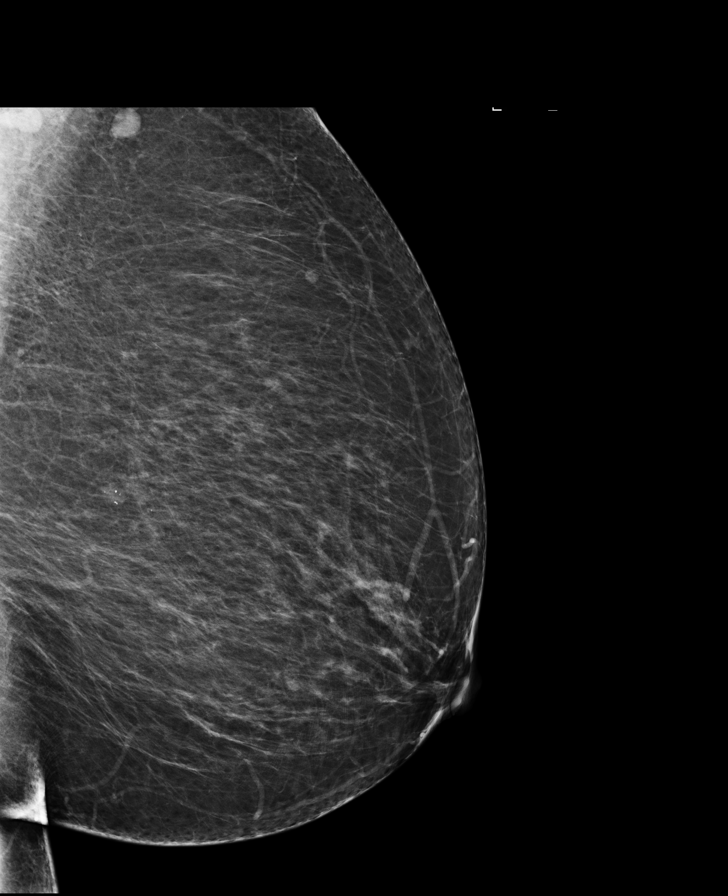

[L CC]
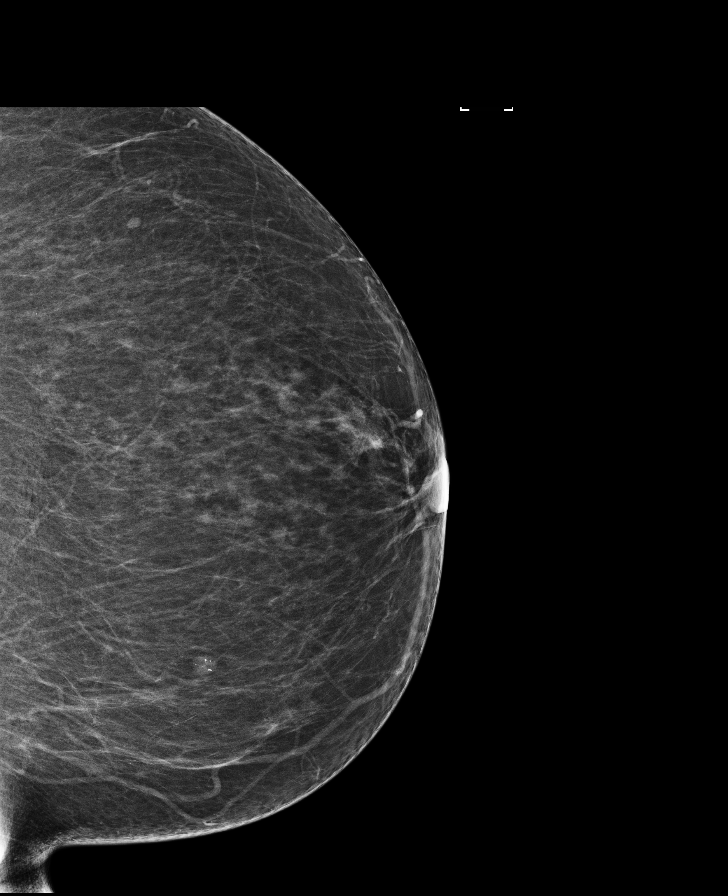

[R MLO synth-2D]
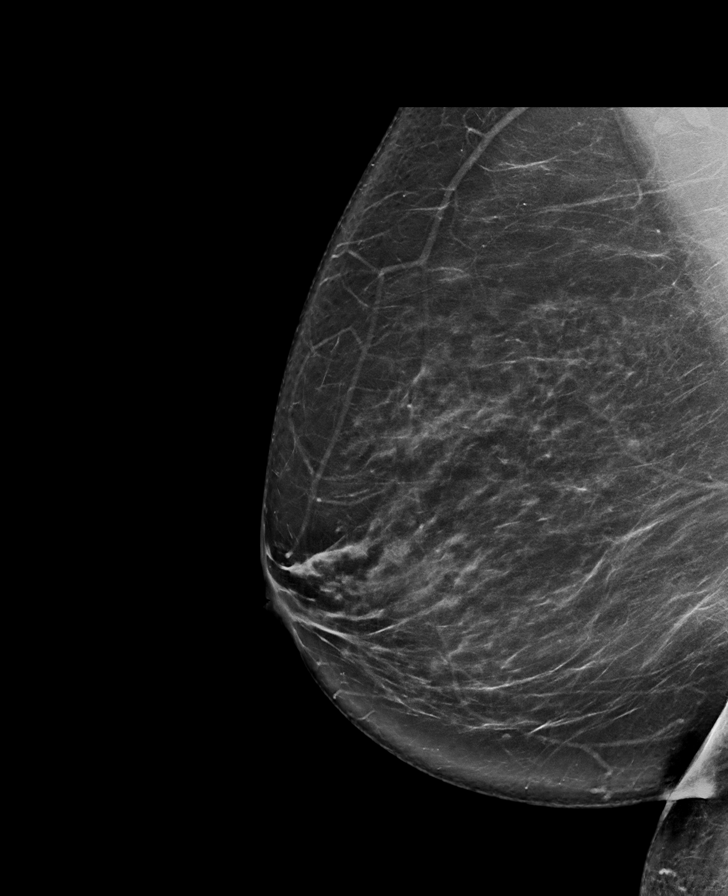

[8 of 30 positions shown; findings below may reference images not displayed]

ACR Breast Density Category c: The breast tissue is heterogeneously
dense, which may obscure small masses.
FINDINGS: There are no findings suspicious for malignancy. Images were
processed with CAD.
IMPRESSION: No mammographic evidence of malignancy. A result letter of this
screening mammogram will be mailed directly to the patient.

RECOMMENDATION:
Screening mammogram in one year. (Code:[id number])

BI-RADS CATEGORY  1: Negative.

## 2018-10-18 ENCOUNTER — Encounter: Payer: Self-pay | Admitting: Family Medicine

## 2018-10-18 ENCOUNTER — Other Ambulatory Visit: Payer: Self-pay

## 2018-10-18 ENCOUNTER — Ambulatory Visit (INDEPENDENT_AMBULATORY_CARE_PROVIDER_SITE_OTHER): Payer: Medicare HMO | Admitting: Family Medicine

## 2018-10-18 ENCOUNTER — Other Ambulatory Visit: Payer: Self-pay | Admitting: Family Medicine

## 2018-10-18 VITALS — BP 122/82 | HR 81 | Temp 96.9°F | Resp 14 | Ht 67.0 in | Wt 209.4 lb

## 2018-10-18 DIAGNOSIS — E785 Hyperlipidemia, unspecified: Secondary | ICD-10-CM | POA: Diagnosis not present

## 2018-10-18 DIAGNOSIS — I10 Essential (primary) hypertension: Secondary | ICD-10-CM | POA: Diagnosis not present

## 2018-10-18 DIAGNOSIS — J439 Emphysema, unspecified: Secondary | ICD-10-CM

## 2018-10-18 DIAGNOSIS — E669 Obesity, unspecified: Secondary | ICD-10-CM

## 2018-10-18 DIAGNOSIS — R600 Localized edema: Secondary | ICD-10-CM

## 2018-10-18 DIAGNOSIS — E559 Vitamin D deficiency, unspecified: Secondary | ICD-10-CM | POA: Diagnosis not present

## 2018-10-18 DIAGNOSIS — I739 Peripheral vascular disease, unspecified: Secondary | ICD-10-CM

## 2018-10-18 DIAGNOSIS — M8589 Other specified disorders of bone density and structure, multiple sites: Secondary | ICD-10-CM

## 2018-10-18 DIAGNOSIS — I2510 Atherosclerotic heart disease of native coronary artery without angina pectoris: Secondary | ICD-10-CM

## 2018-10-18 DIAGNOSIS — I700 Atherosclerosis of aorta: Secondary | ICD-10-CM

## 2018-10-18 DIAGNOSIS — J301 Allergic rhinitis due to pollen: Secondary | ICD-10-CM

## 2018-10-18 DIAGNOSIS — K219 Gastro-esophageal reflux disease without esophagitis: Secondary | ICD-10-CM | POA: Diagnosis not present

## 2018-10-18 DIAGNOSIS — E66811 Obesity, class 1: Secondary | ICD-10-CM

## 2018-10-18 MED ORDER — HYDROCHLOROTHIAZIDE 12.5 MG PO TABS: 12.5000 mg | ORAL_TABLET | Freq: Every day | ORAL | 3 refills | Status: DC

## 2018-10-18 NOTE — Progress Notes (Signed)
Name: Dana Peters   MRN: [id number]    DOB: 1945/06/15   Date:10/18/2018       Progress Note  Subjective  Chief Complaint  Chief Complaint  Patient presents with  . Follow-up  . Edema    bilateral ankles  . Medication Refill    HPI  HTN/CKD: Not taking any medications at this time for her BP.  BP is at goal today. No chest pain, shortness of breath.  Does note intermittent BLE edema that does go away with elevation.  She does not wear compression socks because it is hot right now. She would like to try low dose HCTZ - we will do this and recheck kidney function in 2 weeks.  Kidney function was normal in March 2020.  CAD/PAD/Thoracic aortic atherosclerosis: Allergic to statins, no chest pain or shortness of breath; taking zetia and aspirin daily without issues.  Last lipids were at goal.   GERD: Has been watching her diet and doing well.  No medications at this time.   AR and Emphysema: Taking claritin PRN; not using nasonex.  She has very mild emphysema.  Not having any shortness of breath, not on any inhalers.   Bilateral foot neuropathy:  Taking duloxetine and this seems to be stable. No pain, but her numbness is still present but stable.  No falls.  Osteopenia: She is due in October for bone density.  No recent falls or fractures. On vitamin D supplement.   Obesity: Gardening a lot lately.  Diet is balanced. Mostly cooks at home - bakes and grilled for the most part.   Patient Active Problem List   Diagnosis Date Noted  . Bilateral lower extremity edema 09/27/2017  . Emphysema of lung (HCC) 08/14/2017  . Ganglion cyst of finger 06/25/2017  . Medicare annual wellness visit, subsequent 06/25/2017  . Full code status 06/25/2017  . Osteoarthritis of knee 06/12/2017  . Baker's cyst of knee, right 06/04/2017  . Thoracic aortic atherosclerosis (HCC) 08/01/2016  . Osteopenia 06/15/2016  . Obesity (BMI 30.0-34.9) 05/29/2016  . Urinary hesitancy 02/14/2016  . PAD  (peripheral artery disease) (HCC) 09/09/2015  . Coronary artery calcification seen on CT scan 07/20/2015  . Personal history of tobacco use, presenting hazards to health 07/14/2015  . Preventative health care 06/05/2015  . Medication monitoring encounter 05/07/2015  . Breast cancer screening 03/12/2015  . Foraminal stenosis of lumbar region 01/28/2015  . Facet arthropathy, lumbar 01/28/2015  . Spinal stenosis of lumbar region 01/28/2015  . Neuropathy of both feet 01/07/2015  . Leg pain, bilateral 01/07/2015  . Leg heaviness 01/07/2015  . CKD (chronic kidney disease) stage 2, GFR 60-89 ml/min 10/17/2014  . Allergic rhinitis 10/14/2014  . Allergic conjunctivitis 10/14/2014  . Dyslipidemia 10/14/2014  . Hypertension   . Vitamin D deficiency disease   . GERD (gastroesophageal reflux disease) 02/02/2014  . Neuralgia neuritis, sciatic nerve 08/28/2012  . Sciatica of left side 08/28/2012    Past Surgical History:  Procedure Laterality Date  . ABDOMINAL HYSTERECTOMY     complete, due to heavy  . CATARACT EXTRACTION, BILATERAL    . ECTOPIC PREGNANCY SURGERY      Family History  Problem Relation Age of Onset  . Cancer Mother        colon  . Hypertension Mother   . Alzheimer's disease Mother   . Cancer Father        prostate  . Cancer Brother        lung  .  Stroke Brother   . Cancer Brother        prostate and brain  . Cancer Brother        throat  . Diabetes Paternal Aunt   . Breast cancer Paternal Aunt   . Healthy Daughter   . Cirrhosis Brother     Social History   Socioeconomic History  . Marital status: Married    Spouse name: Not on file  . Number of children: 1  . Years of education: Not on file  . Highest education level: 12th grade  Occupational History  . Occupation: retired  Engineer, productionocial Needs  . Financial resource strain: Not very hard  . Food insecurity    Worry: Never true    Inability: Never true  . Transportation needs    Medical: No    Non-medical:  No  Tobacco Use  . Smoking status: Former Smoker    Packs/day: 1.00    Years: 30.00    Pack years: 30.00    Types: Cigarettes    Quit date: 10/13/2005    Years since quitting: 13.0  . Smokeless tobacco: Former NeurosurgeonUser    Types: Snuff    Quit date: 10/09/2011  Substance and Sexual Activity  . Alcohol use: No    Alcohol/week: 0.0 standard drinks  . Drug use: No  . Sexual activity: Yes  Lifestyle  . Physical activity    Days per week: 0 days    Minutes per session: 0 min  . Stress: Only a little  Relationships  . Social connections    Talks on phone: More than three times a week    Gets together: Three times a week    Attends religious service: More than 4 times per year    Active member of club or organization: No    Attends meetings of clubs or organizations: Never    Relationship status: Married  . Intimate partner violence    Fear of current or ex partner: No    Emotionally abused: No    Physically abused: No    Forced sexual activity: No  Other Topics Concern  . Not on file  Social History Narrative   Lives with husband in a one story home.  Has one daughter.     Retired from [employer] work.     Education: high school.     Current Outpatient Medications:  .  aspirin EC 81 MG tablet, Take 1 tablet (81 mg total) by mouth daily., Disp: 30 tablet, Rfl: 11 .  Cholecalciferol (VITAMIN D) 2000 units CAPS, Take by mouth daily., Disp: , Rfl:  .  DULoxetine (CYMBALTA) 30 MG capsule, Take 1 capsule by mouth once daily, Disp: 90 capsule, Rfl: 0 .  ezetimibe (ZETIA) 10 MG tablet, Take 1 tablet (10 mg total) by mouth daily., Disp: 90 tablet, Rfl: 3 .  loratadine (CLARITIN) 10 MG tablet, Take 1 tablet (10 mg total) by mouth daily as needed., Disp: 90 tablet, Rfl: 1 .  mometasone (NASONEX) 50 MCG/ACT nasal spray, Place 2 sprays into the nose as needed. , Disp: , Rfl:   Allergies  Allergen Reactions  . Atorvastatin Itching  . Codeine Sulfate Itching  . Pravastatin Nausea Only  .  Protonix [Pantoprazole Sodium] Other (See Comments)    Legs felt heavy  . Tylenol With Codeine #3  [Acetaminophen-Codeine] Itching  . Penicillin G Rash    I personally reviewed active problem list, medication list, allergies, notes from last encounter, lab results with the patient/caregiver today.  ROS  Constitutional: Negative for fever or weight change.  Respiratory: Negative for cough and shortness of breath.   Cardiovascular: Negative for chest pain or palpitations.  Gastrointestinal: Negative for abdominal pain, no bowel changes.  Musculoskeletal: Negative for gait problem or joint swelling.  Skin: Negative for rash.  Neurological: Negative for dizziness or headache.  No other specific complaints in a complete review of systems (except as listed in HPI above).  Objective  Vitals:   10/18/18 1015  BP: 122/82  Pulse: 81  Resp: 14  Temp: (!) 96.9 F (36.1 C)  SpO2: 94%  Weight: 209 lb 6.4 oz (95 kg)  Height: 5\' 7"  (1.702 m)   Body mass index is 32.8 kg/m.  Physical Exam Constitutional: Patient appears well-developed and well-nourished. No distress.  HENT: Head: Normocephalic and atraumatic.  Eyes: Conjunctivae and EOM are normal. No scleral icterus. Neck: Normal range of motion. Neck supple. No JVD present. No thyromegaly present.  Cardiovascular: Normal rate, regular rhythm and normal heart sounds.  No murmur heard. No BLE edema. Pulmonary/Chest: Effort normal and breath sounds normal. No respiratory distress. Musculoskeletal: Normal range of motion, no joint effusions. No gross deformities Neurological: Pt is alert and oriented to person, place, and time. No cranial nerve deficit. Coordination, balance, strength, speech and gait are normal.  Skin: Skin is warm and dry. No rash noted. No erythema.  Psychiatric: Patient has a normal mood and affect. behavior is normal. Judgment and thought content normal.  No results found for this or any previous visit (from the  past 72 hour(s)).  PHQ2/9: [facility] screen [facility] 06/27/2018 09/12/2017 06/25/2017 06/04/2017 01/19/2017  Decreased Interest 0 0 0 0 0  Down, Depressed, Hopeless 0 0 0 0 0  PHQ - 2 Score 0 0 0 0 0   PHQ-2/9 Result is negative.    Fall Risk: Fall Risk  10/18/2018 06/27/2018 09/12/2017 06/25/2017 06/04/2017  Falls in the past year? 0 0 No No No  Number falls in past yr: 0 0 - - -  Injury with Fall? 0 0 - - -  Follow up - Falls prevention discussed - - -    Functional Status Survey: Is the patient deaf or have difficulty hearing?: Yes Does the patient have difficulty seeing, even when wearing glasses/contacts?: No Does the patient have difficulty concentrating, remembering, or making decisions?: No Does the patient have difficulty walking or climbing stairs?: No Does the patient have difficulty dressing or bathing?: No Does the patient have difficulty doing errands alone such as visiting a doctor's office or shopping?: No  Assessment & Plan  1. Coronary artery calcification seen on CT scan - Continue Zetia  2. Essential hypertension - DASH diet discussed - BASIC METABOLIC PANEL WITH GFR; Future - hydrochlorothiazide (HYDRODIURIL) 12.5 MG tablet; Take 1 tablet (12.5 mg total) by mouth daily.  Dispense: 90 tablet; Refill: 3  3. PAD (peripheral artery disease) (HCC) - Zetia  4. Thoracic aortic atherosclerosis (HCC) - Zetia  5. Pulmonary emphysema, unspecified emphysema type (HCC) - Stable, no medications; annual CT screenings  6. Gastroesophageal reflux disease without esophagitis - Diet controlled  7. Seasonal allergic rhinitis due to pollen - Claritin PRN  8. Osteopenia of multiple sites - Vitamin D supplement, due for bone density in October 2020  9. Dyslipidemia - Zetia  10. Bilateral lower extremity edema - Advised will stop HCTZ if BP is low, dizziness/lightheadedness, or if kidney function drops. - BASIC METABOLIC PANEL WITH GFR; Future - hydrochlorothiazide  (HYDRODIURIL) 12.5  MG tablet; Take 1 tablet (12.5 mg total) by mouth daily.  Dispense: 90 tablet; Refill: 3  11. Obesity (BMI 30.0-34.9) - Discussed importance of 150 minutes of physical activity weekly, eat two servings of fish weekly, eat one serving of tree nuts ( cashews, pistachios, pecans, almonds.Marland Kitchen) every other day, eat 6 servings of fruit/vegetables daily and drink plenty of water and avoid sweet beverages.   12. Vitamin D deficiency disease - Continue supplement.

## 2018-11-01 ENCOUNTER — Telehealth: Payer: Self-pay | Admitting: Family Medicine

## 2018-11-01 ENCOUNTER — Ambulatory Visit: Payer: Medicare HMO

## 2018-11-01 NOTE — Telephone Encounter (Signed)
-----   Message from Hubbard Hartshorn, FNP sent at 10/18/2018 10:27 AM EDT ----- Regarding: Have patient come in for BP check and BMP Have patient come in for BP check and BMP

## 2018-11-06 NOTE — Telephone Encounter (Signed)
Called patient. NA.

## 2018-12-03 ENCOUNTER — Encounter: Payer: Self-pay | Admitting: Family Medicine

## 2018-12-03 ENCOUNTER — Ambulatory Visit (INDEPENDENT_AMBULATORY_CARE_PROVIDER_SITE_OTHER): Payer: Medicare HMO | Admitting: Family Medicine

## 2018-12-03 ENCOUNTER — Other Ambulatory Visit: Payer: Self-pay

## 2018-12-03 VITALS — BP 130/72 | HR 73 | Temp 98.0°F | Ht 67.0 in | Wt 207.1 lb

## 2018-12-03 DIAGNOSIS — N182 Chronic kidney disease, stage 2 (mild): Secondary | ICD-10-CM | POA: Diagnosis not present

## 2018-12-03 DIAGNOSIS — I10 Essential (primary) hypertension: Secondary | ICD-10-CM | POA: Diagnosis not present

## 2018-12-03 DIAGNOSIS — R600 Localized edema: Secondary | ICD-10-CM | POA: Diagnosis not present

## 2018-12-03 DIAGNOSIS — Z23 Encounter for immunization: Secondary | ICD-10-CM

## 2018-12-03 LAB — BASIC METABOLIC PANEL WITHOUT GFR
BUN/Creatinine Ratio: 19 (calc) (ref 6–22)
BUN: 18 mg/dL (ref 7–25)
CO2: 29 mmol/L (ref 20–32)
Calcium: 9.8 mg/dL (ref 8.6–10.4)
Chloride: 103 mmol/L (ref 98–110)
Creat: 0.95 mg/dL — ABNORMAL HIGH (ref 0.60–0.93)
GFR, Est African American: 68 mL/min/{1.73_m2}
GFR, Est Non African American: 59 mL/min/{1.73_m2} — ABNORMAL LOW
Glucose, Bld: 106 mg/dL — ABNORMAL HIGH (ref 65–99)
Potassium: 3.8 mmol/L (ref 3.5–5.3)
Sodium: 141 mmol/L (ref 135–146)

## 2018-12-03 NOTE — Progress Notes (Signed)
Name: Dana Peters   MRN: [id number]    DOB: [birth year]   Date:12/03/2018       Progress Note  Subjective  Chief Complaint  Chief Complaint  Patient presents with  . Follow-up    2 week follow/wants bloodwork    I connected with  Dana Peters  on 12/03/18 at  7:40 AM EDT by a video enabled telemedicine application and verified that I am speaking with the correct person using two identifiers.  I discussed the limitations of evaluation and management by telemedicine and the availability of in person appointments. The patient expressed understanding and agreed to proceed. Staff also discussed with the patient that there may be a patient responsible charge related to this service. Patient Location: Office Provider Location: Home Additional Individuals present: Riesa PopeHelen Jeffries, CMA  HPI  Pt presents to follow up on intermittent BLE edema that is dependent.  She doe snot like compression stockings when the weather is hot, and has not been wearing.  We did start her on low dose HCTZ 2 weeks ago, and will recheck her BMP today to monitor her kidney function and electrolytes as she does have history of CKD (though her last 3 kidney functions over the last year have been in normal range).  Today she notes that her swelling is doing better with the HCTZ - still having a few days of swelling, but these are much less in severity and frequency than before.  Denies chest pain, shortness of breath, no lightheadedness, no headaches.  She has been trying to stay hydrated.   Patient Active Problem List   Diagnosis Date Noted  . Bilateral lower extremity edema 09/27/2017  . Emphysema of lung (HCC) 08/14/2017  . Ganglion cyst of finger 06/25/2017  . Medicare annual wellness visit, subsequent 06/25/2017  . Full code status 06/25/2017  . Osteoarthritis of knee 06/12/2017  . Baker's cyst of knee, right 06/04/2017  . Thoracic aortic atherosclerosis (HCC) 08/01/2016  . Osteopenia 06/15/2016  .  Obesity (BMI 30.0-34.9) 05/29/2016  . Urinary hesitancy 02/14/2016  . PAD (peripheral artery disease) (HCC) 09/09/2015  . Coronary artery calcification seen on CT scan 07/20/2015  . Personal history of tobacco use, presenting hazards to health 07/14/2015  . Preventative health care 06/05/2015  . Medication monitoring encounter 05/07/2015  . Foraminal stenosis of lumbar region 01/28/2015  . Facet arthropathy, lumbar 01/28/2015  . Spinal stenosis of lumbar region 01/28/2015  . Neuropathy of both feet 01/07/2015  . Leg pain, bilateral 01/07/2015  . Leg heaviness 01/07/2015  . CKD (chronic kidney disease) stage 2, GFR 60-89 ml/min 10/17/2014  . Allergic rhinitis 10/14/2014  . Dyslipidemia 10/14/2014  . Hypertension   . Vitamin D deficiency disease   . GERD (gastroesophageal reflux disease) 02/02/2014    Past Surgical History:  Procedure Laterality Date  . ABDOMINAL HYSTERECTOMY     complete, due to heavy  . CATARACT EXTRACTION, BILATERAL    . ECTOPIC PREGNANCY SURGERY      Family History  Problem Relation Age of Onset  . Cancer Mother        colon  . Hypertension Mother   . Alzheimer's disease Mother   . Cancer Father        prostate  . Cancer Brother        lung  . Stroke Brother   . Cancer Brother        prostate and brain  . Cancer Brother        throat  .  Diabetes Paternal Aunt   . Breast cancer Paternal Aunt   . Healthy Daughter   . Cirrhosis Brother     Social History   Socioeconomic History  . Marital status: Married    Spouse name: Not on file  . Number of children: 1  . Years of education: Not on file  . Highest education level: 12th grade  Occupational History  . Occupation: retired  Scientific laboratory technician  . Financial resource strain: Not very hard  . Food insecurity    Worry: Never true    Inability: Never true  . Transportation needs    Medical: No    Non-medical: No  Tobacco Use  . Smoking status: Former Smoker    Packs/day: 1.00    Years: 30.00     Pack years: 30.00    Types: Cigarettes    Quit date: 10/13/2005    Years since quitting: 13.1  . Smokeless tobacco: Former Systems developer    Types: Snuff    Quit date: 10/09/2011  Substance and Sexual Activity  . Alcohol use: No    Alcohol/week: 0.0 standard drinks  . Drug use: No  . Sexual activity: Yes  Lifestyle  . Physical activity    Days per week: 0 days    Minutes per session: 0 min  . Stress: Only a little  Relationships  . Social connections    Talks on phone: More than three times a week    Gets together: Three times a week    Attends religious service: More than 4 times per year    Active member of club or organization: No    Attends meetings of clubs or organizations: Never    Relationship status: Married  . Intimate partner violence    Fear of current or ex partner: No    Emotionally abused: No    Physically abused: No    Forced sexual activity: No  Other Topics Concern  . Not on file  Social History Narrative   Lives with husband in a one story home.  Has one daughter.     Retired from [employer] work.     Education: high school.     Current Outpatient Medications:  .  aspirin EC 81 MG tablet, Take 1 tablet (81 mg total) by mouth daily., Disp: 30 tablet, Rfl: 11 .  Cholecalciferol (VITAMIN D) 2000 units CAPS, Take by mouth daily., Disp: , Rfl:  .  DULoxetine (CYMBALTA) 30 MG capsule, Take 1 capsule by mouth once daily, Disp: 90 capsule, Rfl: 0 .  ezetimibe (ZETIA) 10 MG tablet, Take 1 tablet (10 mg total) by mouth daily., Disp: 90 tablet, Rfl: 3 .  hydrochlorothiazide (HYDRODIURIL) 12.5 MG tablet, Take 1 tablet (12.5 mg total) by mouth daily., Disp: 90 tablet, Rfl: 3 .  loratadine (CLARITIN) 10 MG tablet, Take 1 tablet (10 mg total) by mouth daily as needed., Disp: 90 tablet, Rfl: 1  Allergies  Allergen Reactions  . Atorvastatin Itching  . Codeine Sulfate Itching  . Pravastatin Nausea Only  . Protonix [Pantoprazole Sodium] Other (See Comments)    Legs felt heavy   . Tylenol With Codeine #3  [Acetaminophen-Codeine] Itching  . Penicillin G Rash    I personally reviewed active problem list, medication list, allergies, notes from last encounter, lab results with the patient/caregiver today.  ROS Constitutional: Negative for fever or weight change.  Respiratory: Negative for cough and shortness of breath.   Cardiovascular: Negative for chest pain or palpitations.  Gastrointestinal: Negative for abdominal  pain, no bowel changes.  Musculoskeletal: Negative for gait problem or joint swelling.  Skin: Negative for rash.  Neurological: Negative for dizziness or headache.  No other specific complaints in a complete review of systems (except as listed in HPI above).  Objective  Today's Vitals   12/03/18 0742  BP: 130/72  Pulse: 73  Temp: 98 F (36.7 C)  Weight: 207 lb 1.6 oz (93.9 kg)  Height: 5\' 7"  (1.702 m)   Body mass index is 32.44 kg/m.   Body mass index is 32.44 kg/m.  Physical Exam  Constitutional: Patient appears well-developed and well-nourished. No distress.  HENT: Head: Normocephalic and atraumatic.  Neck: Normal range of motion. Pulmonary/Chest: Effort normal. No respiratory distress. Speaking in complete sentences Neurological: Pt is alert and oriented to person, place, and time. Coordination, speech are normal.  Psychiatric: Patient has a normal mood and affect. behavior is normal. Judgment and thought content normal.  No results found for this or any previous visit (from the past 72 hour(s)).  PHQ2/9: Depression screen [facility] 2/9 12/03/2018 06/27/2018 09/12/2017 06/25/2017 06/04/2017  Decreased Interest 0 0 0 0 0  Down, Depressed, Hopeless 0 0 0 0 0  PHQ - 2 Score 0 0 0 0 0  Altered sleeping 0 - - - -  Tired, decreased energy 0 - - - -  Change in appetite 1 - - - -  Feeling bad or failure about yourself  0 - - - -  Trouble concentrating 0 - - - -  Moving slowly or fidgety/restless 0 - - - -  Suicidal thoughts 0 - - - -  PHQ-9  Score 1 - - - -  Difficult doing work/chores Not difficult at all - - - -   PHQ-2/9 Result is negative.    Fall Risk: Fall Risk  12/03/2018 10/18/2018 06/27/2018 09/12/2017 06/25/2017  Falls in the past year? 0 0 0 No No  Number falls in past yr: 0 0 0 - -  Injury with Fall? 0 0 0 - -  Follow up - - Falls prevention discussed - -     Assessment & Plan  1. Bilateral lower extremity edema 2. Essential hypertension - Pt is tolerating medication well, will have BMP drawn today to monitor kidney function.  Her BLE edema has improved per her report and no additional changes to be made at this time.  I do still strongly recommend compression stockings, which she will consider when the weather is cooler.  3. Needs flu shot - Flu Vaccine QUAD High Dose(Fluad)  4. CKD (chronic kidney disease) stage 2, GFR 60-89 ml/min - BMP per orders.  I discussed the assessment and treatment plan with the patient. The patient was provided an opportunity to ask questions and all were answered. The patient agreed with the plan and demonstrated an understanding of the instructions.  The patient was advised to call back or seek an in-person evaluation if the symptoms worsen or if the condition fails to improve as anticipated.  I provided 11 minutes of non-face-to-face time during this encounter.

## 2018-12-05 ENCOUNTER — Ambulatory Visit: Payer: Medicare HMO | Admitting: Family Medicine

## 2018-12-17 ENCOUNTER — Other Ambulatory Visit: Payer: Self-pay | Admitting: Family Medicine

## 2018-12-17 DIAGNOSIS — Z1231 Encounter for screening mammogram for malignant neoplasm of breast: Secondary | ICD-10-CM

## 2018-12-23 ENCOUNTER — Telehealth: Payer: Self-pay | Admitting: Family Medicine

## 2018-12-23 ENCOUNTER — Other Ambulatory Visit: Payer: Self-pay

## 2018-12-23 NOTE — Telephone Encounter (Signed)
Medication:ezetimibe (ZETIA) 10 MG tablet    Patient is requesting a refill of this medication; 90 day supply.      [facility] [place] [facility] [place] [phone] (Phone) [phone] (Fax)

## 2018-12-25 ENCOUNTER — Other Ambulatory Visit: Payer: Self-pay | Admitting: Emergency Medicine

## 2018-12-25 MED ORDER — EZETIMIBE 10 MG PO TABS: 10.0000 mg | ORAL_TABLET | Freq: Every day | ORAL | 3 refills | Status: DC

## 2018-12-25 NOTE — Telephone Encounter (Signed)
Message for refills sent to [facility]

## 2019-01-02 ENCOUNTER — Ambulatory Visit: Payer: Medicare HMO | Admitting: Family Medicine

## 2019-01-15 ENCOUNTER — Telehealth: Payer: Self-pay | Admitting: Emergency Medicine

## 2019-01-15 NOTE — Telephone Encounter (Signed)
Patient notified

## 2019-01-15 NOTE — Telephone Encounter (Signed)
It looks like it is a prebiotic/probiotic which is fine for her to take.  I don't recommend anything with extra caffeine in it. Thanks!

## 2019-01-15 NOTE — Telephone Encounter (Signed)
Patient stated she ordered a medication online called BIO3 from a Dr.Grundy. Would like to know if it is ok for her to take this.

## 2019-01-23 ENCOUNTER — Ambulatory Visit
Admission: RE | Admit: 2019-01-23 | Discharge: 2019-01-23 | Disposition: A | Discharge status: Home / Self Care | Payer: Medicare HMO | Source: Ambulatory Visit | Attending: Family Medicine | Admitting: Family Medicine

## 2019-01-23 DIAGNOSIS — Z1231 Encounter for screening mammogram for malignant neoplasm of breast: Secondary | ICD-10-CM | POA: Diagnosis present

## 2019-01-24 ENCOUNTER — Other Ambulatory Visit: Payer: Self-pay | Admitting: Family Medicine

## 2019-02-17 IMAGING — US US EXTREM LOW VENOUS*R*
1 series · 13 of 24 positions shown · non-contrast
Comparison: None.

CLINICAL DATA: Right lower extremity edema for the past 4 days.
Evaluate for DVT.



[Series 1: us extrem low venous*right* · 0.07mm/px · 13 of 48 slices shown]
[im 1/48]
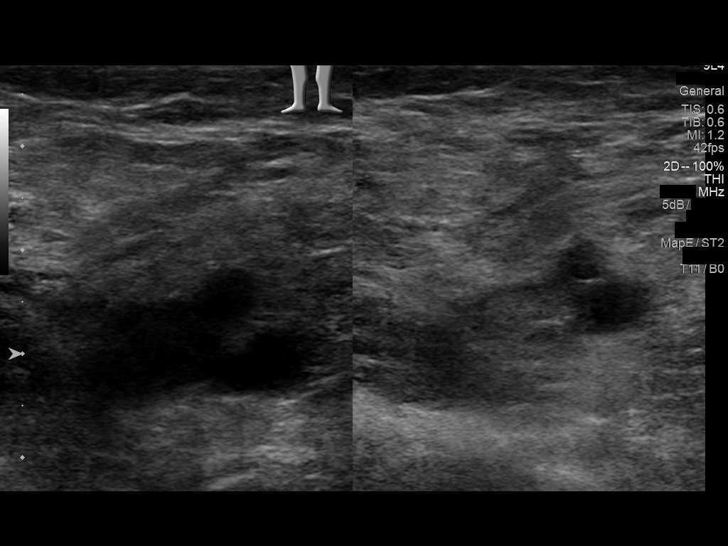
[im 5/48]
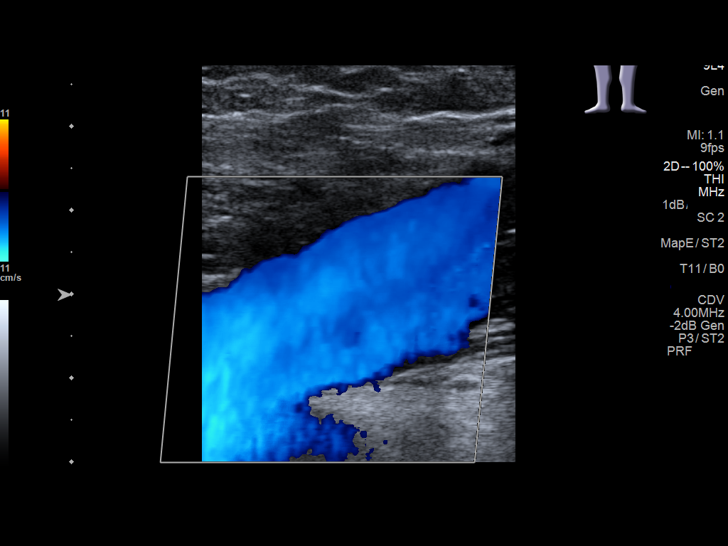
[im 9/48]
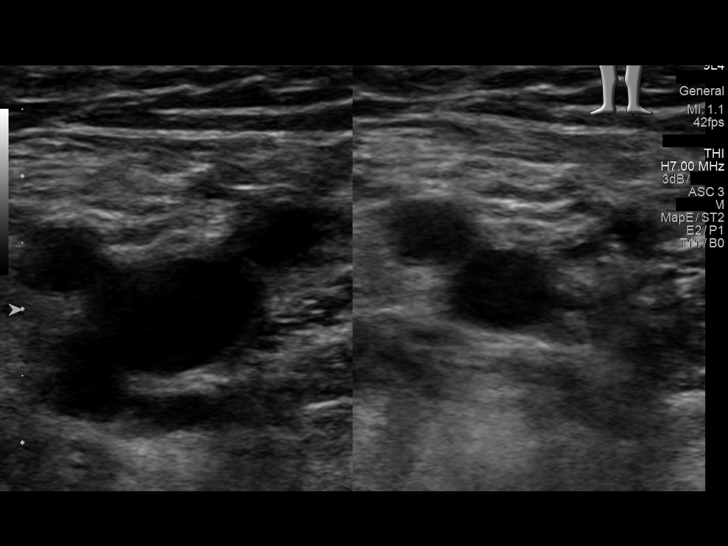
[im 13/48]
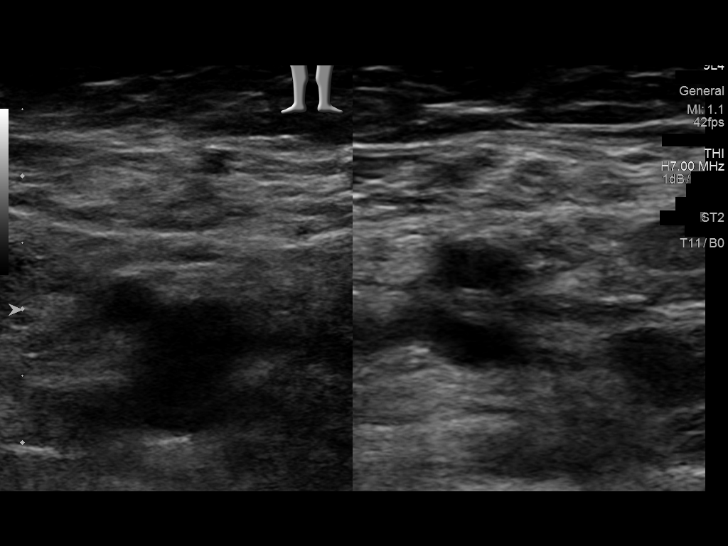
[im 17/48]
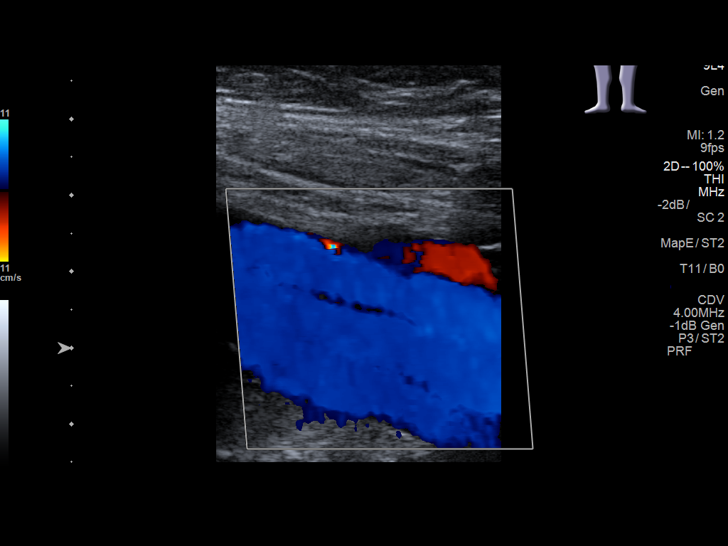
[im 21/48]
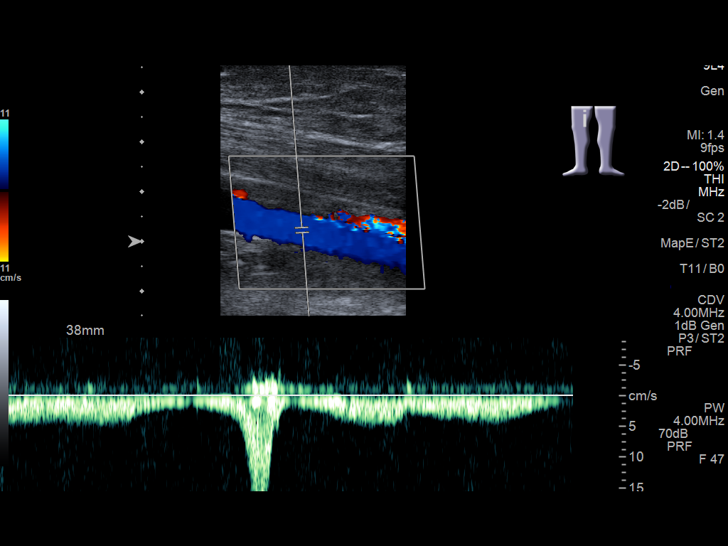
[im 25/48]
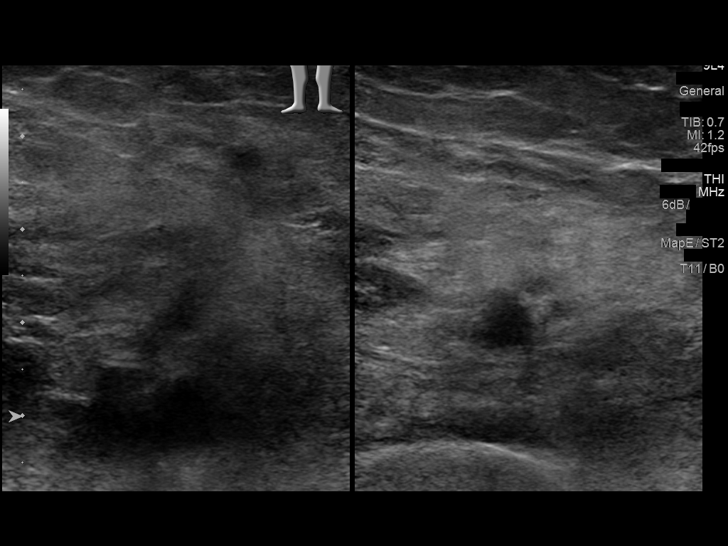
[im 27/48]
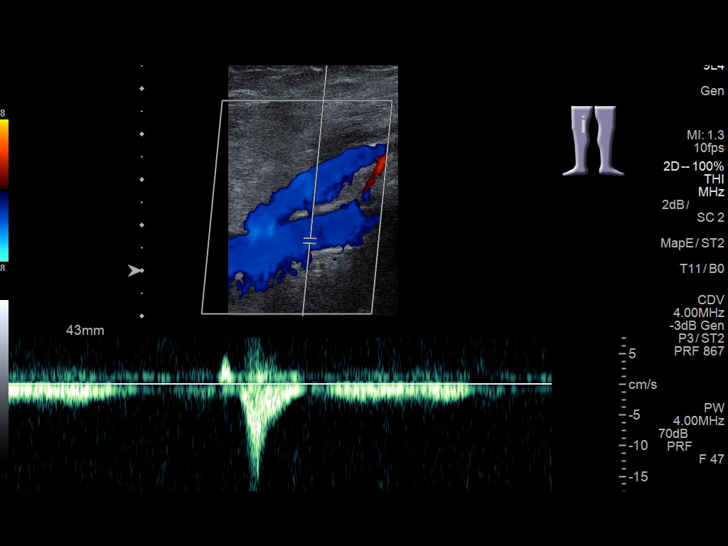
[im 31/48]
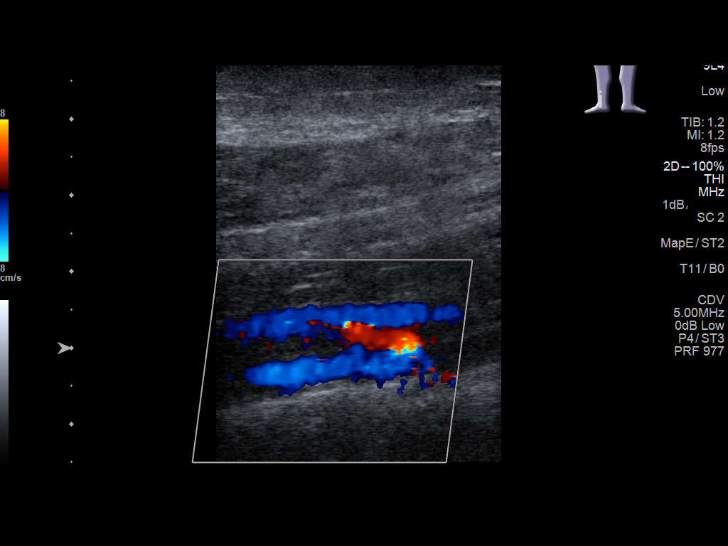
[im 35/48]
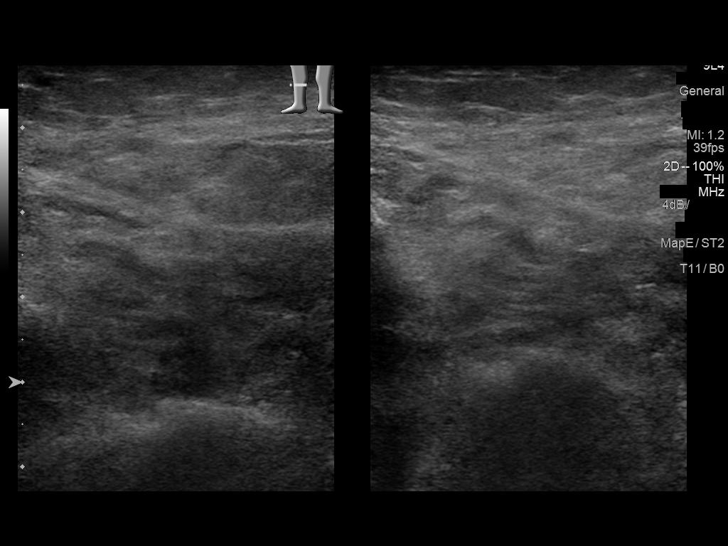
[im 39/48]
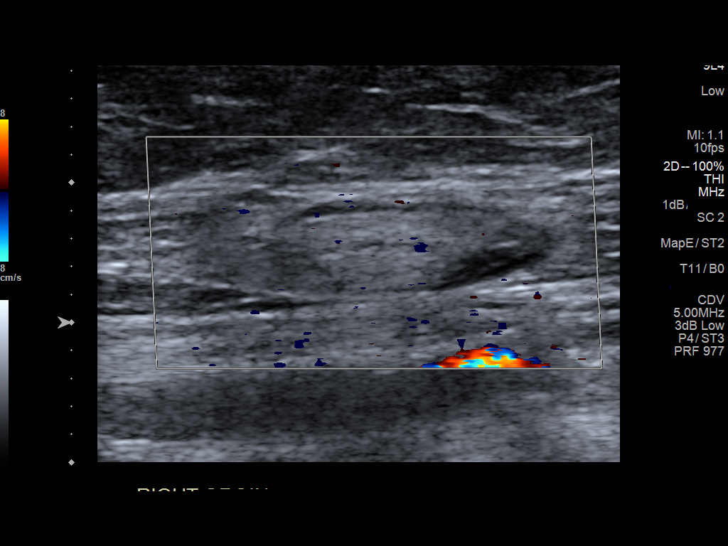
[im 43/48]
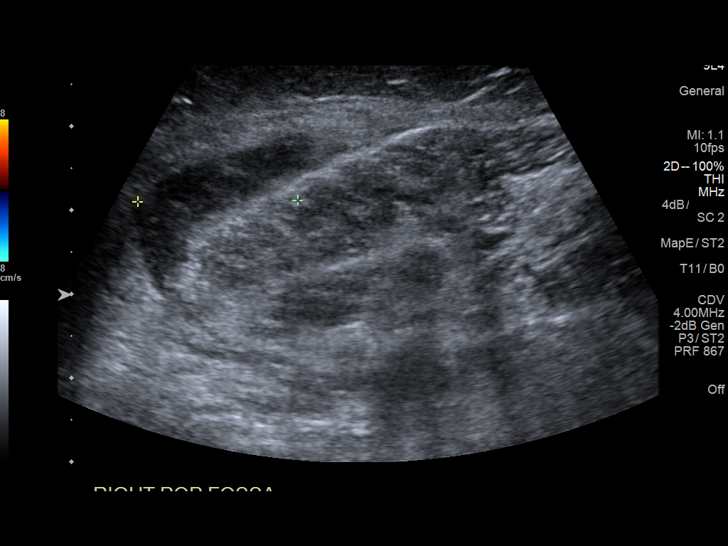
[im 48/48]
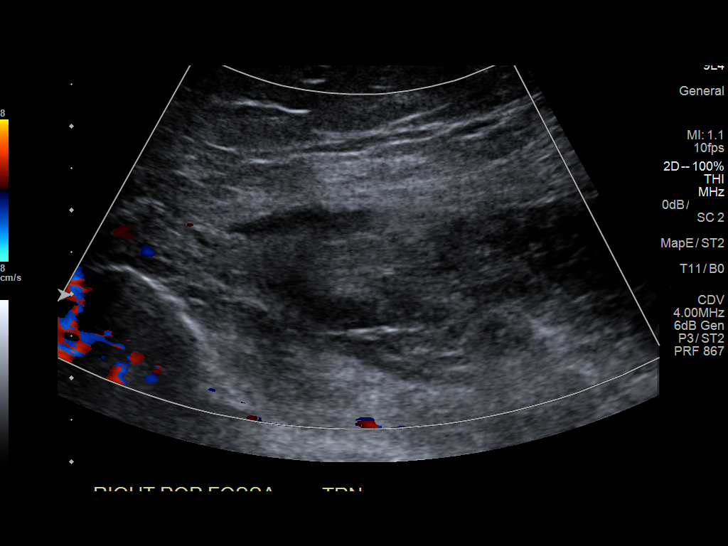

[13 of 24 positions shown; findings below may reference images not displayed]

FINDINGS: Contralateral Common Femoral Vein: Respiratory phasicity is normal
and symmetric with the symptomatic side. No evidence of thrombus.
Normal compressibility.

Common Femoral Vein: No evidence of thrombus. Normal
compressibility, respiratory phasicity and response to augmentation.

Saphenofemoral Junction: No evidence of thrombus. Normal
compressibility and flow on color Doppler imaging.

Profunda Femoral Vein: No evidence of thrombus. Normal
compressibility and flow on color Doppler imaging.

Femoral Vein: No evidence of thrombus. Normal compressibility,
respiratory phasicity and response to augmentation.

Popliteal Vein: No evidence of thrombus. Normal compressibility,
respiratory phasicity and response to augmentation.

Calf Veins: No evidence of thrombus. Normal compressibility and flow
on color Doppler imaging.

Superficial Great Saphenous Vein: No evidence of thrombus. Normal
compressibility.

Venous Reflux:  None.

Other Findings: Note is made of a benign appearing non
pathologically enlarged right inguinal lymph node which measures
approximately 0.6 cm in greatest short axis diameter and maintains a
benign fatty hilum (image 41). Note is made of a approximately 1.9 x
5.6 x 1.0 cm serpiginous apparent fluid collection with the right
popliteal fossa favored to represent a Baker cyst.
IMPRESSION: No evidence of DVT within the right lower extremity.

## 2019-02-21 ENCOUNTER — Other Ambulatory Visit: Payer: Self-pay

## 2019-02-21 ENCOUNTER — Ambulatory Visit: Payer: Medicare HMO | Admitting: Family Medicine

## 2019-02-21 ENCOUNTER — Ambulatory Visit (INDEPENDENT_AMBULATORY_CARE_PROVIDER_SITE_OTHER): Payer: Medicare HMO | Admitting: Family Medicine

## 2019-02-21 ENCOUNTER — Encounter: Payer: Self-pay | Admitting: Family Medicine

## 2019-02-21 DIAGNOSIS — N182 Chronic kidney disease, stage 2 (mild): Secondary | ICD-10-CM | POA: Diagnosis not present

## 2019-02-21 DIAGNOSIS — I10 Essential (primary) hypertension: Secondary | ICD-10-CM

## 2019-02-21 DIAGNOSIS — I2510 Atherosclerotic heart disease of native coronary artery without angina pectoris: Secondary | ICD-10-CM | POA: Diagnosis not present

## 2019-02-21 DIAGNOSIS — E785 Hyperlipidemia, unspecified: Secondary | ICD-10-CM

## 2019-02-21 DIAGNOSIS — R600 Localized edema: Secondary | ICD-10-CM | POA: Diagnosis not present

## 2019-02-21 DIAGNOSIS — J301 Allergic rhinitis due to pollen: Secondary | ICD-10-CM

## 2019-02-21 DIAGNOSIS — E669 Obesity, unspecified: Secondary | ICD-10-CM

## 2019-02-21 DIAGNOSIS — I739 Peripheral vascular disease, unspecified: Secondary | ICD-10-CM | POA: Diagnosis not present

## 2019-02-21 DIAGNOSIS — M8589 Other specified disorders of bone density and structure, multiple sites: Secondary | ICD-10-CM

## 2019-02-21 DIAGNOSIS — I700 Atherosclerosis of aorta: Secondary | ICD-10-CM

## 2019-02-21 DIAGNOSIS — E559 Vitamin D deficiency, unspecified: Secondary | ICD-10-CM | POA: Diagnosis not present

## 2019-02-21 DIAGNOSIS — K219 Gastro-esophageal reflux disease without esophagitis: Secondary | ICD-10-CM | POA: Diagnosis not present

## 2019-02-21 DIAGNOSIS — J439 Emphysema, unspecified: Secondary | ICD-10-CM

## 2019-02-21 DIAGNOSIS — E66811 Obesity, class 1: Secondary | ICD-10-CM

## 2019-02-21 NOTE — Progress Notes (Signed)
Name: Dana Peters   MRN: [id number]    DOB: [birth year]   Date:02/21/2019       Progress Note  Subjective  Chief Complaint  Chief Complaint  Patient presents with  . Follow-up    I connected with  Dana GriffinsElizabeth A Chismar on 02/21/19 at  7:40 AM EST by telephone and verified that I am speaking with the correct person using two identifiers.  I discussed the limitations, risks, security and privacy concerns of performing an evaluation and management service by telephone and the availability of in person appointments. Staff also discussed with the patient that there may be a patient responsible charge related to this service. Patient Location: Home Provider Location: Office Additional Individuals present: None  HPI  HTN/CKD: Taking HCTZ PRN for BLE edema. No chest pain, shortness of breath, headaches, blurred vision.  The HCTZ and compression stockings have helped her BLE edema significantly.  Kidney function was normal in March 2020.  She has not checked her BP recently at home.   CAD/PAD/Thoracic aortic atherosclerosis: Allergic to statins, no chest pain or shortness of breath; taking zetia and aspirin daily without issues.  Last lipids were at goal- due for recheck today.  GERD: Has been watching her diet and doing well.No difficulty swallowing or abdominal pain, no blood in stool.  No medications at this time.   AR and Emphysema: Taking claritin PRN; not using nasonex.  She has very mild emphysema.  Not having any shortness of breath, not on any inhalers. She is former smoker - quit around 2010. Stable and unchanged.  Bilateral foot neuropathy:  Taking duloxetine and this seems to be stable. No pain, but her numbness is still present but stable without worsening.  No falls.  Osteopenia/Vitamin D Deficiency: She is due for repeat bone density - will plan to call [place].  No recent falls or fractures. On vitamin D supplement at 2000IU daily.  No concerns.  Obesity: Gardening a  lot lately.  Diet is balanced. Mostly cooks at home - bakes and grilled for the most part.   Patient Active Problem List   Diagnosis Date Noted  . Bilateral lower extremity edema 09/27/2017  . Emphysema of lung (HCC) 08/14/2017  . Ganglion cyst of finger 06/25/2017  . Medicare annual wellness visit, subsequent 06/25/2017  . Full code status 06/25/2017  . Osteoarthritis of knee 06/12/2017  . Baker's cyst of knee, right 06/04/2017  . Thoracic aortic atherosclerosis (HCC) 08/01/2016  . Osteopenia 06/15/2016  . Obesity (BMI 30.0-34.9) 05/29/2016  . Urinary hesitancy 02/14/2016  . PAD (peripheral artery disease) (HCC) 09/09/2015  . Coronary artery calcification seen on CT scan 07/20/2015  . Personal history of tobacco use, presenting hazards to health 07/14/2015  . Preventative health care 06/05/2015  . Medication monitoring encounter 05/07/2015  . Foraminal stenosis of lumbar region 01/28/2015  . Facet arthropathy, lumbar 01/28/2015  . Spinal stenosis of lumbar region 01/28/2015  . Neuropathy of both feet 01/07/2015  . Leg pain, bilateral 01/07/2015  . Leg heaviness 01/07/2015  . CKD (chronic kidney disease) stage 2, GFR 60-89 ml/min 10/17/2014  . Allergic rhinitis 10/14/2014  . Dyslipidemia 10/14/2014  . Hypertension   . Vitamin D deficiency disease   . GERD (gastroesophageal reflux disease) 02/02/2014    Past Surgical History:  Procedure Laterality Date  . ABDOMINAL HYSTERECTOMY     complete, due to heavy  . CATARACT EXTRACTION, BILATERAL    . ECTOPIC PREGNANCY SURGERY      Family History  Problem Relation Age of Onset  . Cancer Mother        colon  . Hypertension Mother   . Alzheimer's disease Mother   . Cancer Father        prostate  . Cancer Brother        lung  . Stroke Brother   . Cancer Brother        prostate and brain  . Cancer Brother        throat  . Diabetes Paternal Aunt   . Breast cancer Paternal Aunt   . Healthy Daughter   . Cirrhosis Brother      Social History   Socioeconomic History  . Marital status: Married    Spouse name: Not on file  . Number of children: 1  . Years of education: Not on file  . Highest education level: 12th grade  Occupational History  . Occupation: retired  Engineer, production  . Financial resource strain: Not very hard  . Food insecurity    Worry: Never true    Inability: Never true  . Transportation needs    Medical: No    Non-medical: No  Tobacco Use  . Smoking status: Former Smoker    Packs/day: 1.00    Years: 30.00    Pack years: 30.00    Types: Cigarettes    Quit date: 10/13/2005    Years since quitting: 13.3  . Smokeless tobacco: Former Neurosurgeon    Types: Snuff    Quit date: 10/09/2011  Substance and Sexual Activity  . Alcohol use: No    Alcohol/week: 0.0 standard drinks  . Drug use: No  . Sexual activity: Yes  Lifestyle  . Physical activity    Days per week: 0 days    Minutes per session: 0 min  . Stress: Only a little  Relationships  . Social connections    Talks on phone: More than three times a week    Gets together: Three times a week    Attends religious service: More than 4 times per year    Active member of club or organization: No    Attends meetings of clubs or organizations: Never    Relationship status: Married  . Intimate partner violence    Fear of current or ex partner: No    Emotionally abused: No    Physically abused: No    Forced sexual activity: No  Other Topics Concern  . Not on file  Social History Narrative   Lives with husband in a one story home.  Has one daughter.     Retired from [employer] work.     Education: high school.     Current Outpatient Medications:  .  aspirin EC 81 MG tablet, Take 1 tablet (81 mg total) by mouth daily., Disp: 30 tablet, Rfl: 11 .  Cholecalciferol (VITAMIN D) 2000 units CAPS, Take by mouth daily., Disp: , Rfl:  .  DULoxetine (CYMBALTA) 30 MG capsule, Take 1 capsule by mouth once daily, Disp: 90 capsule, Rfl: 0 .  ezetimibe  (ZETIA) 10 MG tablet, Take 1 tablet (10 mg total) by mouth daily., Disp: 90 tablet, Rfl: 3 .  hydrochlorothiazide (HYDRODIURIL) 12.5 MG tablet, Take 1 tablet (12.5 mg total) by mouth daily., Disp: 90 tablet, Rfl: 3 .  loratadine (CLARITIN) 10 MG tablet, Take 1 tablet (10 mg total) by mouth daily as needed., Disp: 90 tablet, Rfl: 1  Allergies  Allergen Reactions  . Atorvastatin Itching  . Codeine Sulfate Itching  .  Pravastatin Nausea Only  . Protonix [Pantoprazole Sodium] Other (See Comments)    Legs felt heavy  . Tylenol With Codeine #3  [Acetaminophen-Codeine] Itching  . Penicillin G Rash    I personally reviewed active problem list, medication list, allergies, notes from last encounter, lab results with the patient/caregiver today.   ROS  Ten systems reviewed and is negative except as mentioned in HPI  Objective  Virtual encounter, vitals not obtained.  There is no height or weight on file to calculate BMI.  Physical Exam  Pulmonary/Chest: Effort normal. No respiratory distress. Speaking in complete sentences Neurological: Pt is alert and oriented to person, place, and time. Coordination, speech and gait are normal.  Psychiatric: Patient has a normal mood and affect. behavior is normal. Judgment and thought content normal.  No results found for this or any previous visit (from the past 72 hour(s)).  PHQ2/9: Depression screen [facility] 2/9 02/21/2019 12/03/2018 06/27/2018 09/12/2017 06/25/2017  Decreased Interest 0 0 0 0 0  Down, Depressed, Hopeless 0 0 0 0 0  PHQ - 2 Score 0 0 0 0 0  Altered sleeping 0 0 - - -  Tired, decreased energy 0 0 - - -  Change in appetite 0 1 - - -  Feeling bad or failure about yourself  0 0 - - -  Trouble concentrating 0 0 - - -  Moving slowly or fidgety/restless 0 0 - - -  Suicidal thoughts 0 0 - - -  PHQ-9 Score 0 1 - - -  Difficult doing work/chores Not difficult at all Not difficult at all - - -   PHQ-2/9 Result is negative.    Fall Risk:  Fall Risk  02/21/2019 12/03/2018 10/18/2018 06/27/2018 09/12/2017  Falls in the past year? 0 0 0 0 No  Number falls in past yr: 0 0 0 0 -  Injury with Fall? 0 0 0 0 -  Follow up Falls evaluation completed - - Falls prevention discussed -    Assessment & Plan  1. Bilateral lower extremity edema - Doing well on low dose HCTZ PRN for swelling.  Due for recheck of kidney function.  2. Essential hypertension - COMPLETE METABOLIC PANEL WITH GFR  3. CKD (chronic kidney disease) stage 2, GFR 60-89 ml/min - Taking HCTZ, and due for recheck of kidney function. - COMPLETE METABOLIC PANEL WITH GFR  4. Thoracic aortic atherosclerosis (HCC) - Continue Zetia and ASA daily. - Lipid panel  5. PAD (peripheral artery disease) (HCC) - Continue Zetia and ASA daily. - Lipid panel  6. Dyslipidemia - Continue Zetia and ASA daily. - Lipid panel  7. Coronary artery calcification seen on CT scan - Continue Zetia and ASA daily.  8. Gastroesophageal reflux disease without esophagitis - Stable on dietary modification only.  9. Pulmonary emphysema, unspecified emphysema type ([place]) - She is doing well, no tobacco use in nearly 10 years.  No maintenance or rescue inhalers at this time.  10. Seasonal allergic rhinitis due to pollen - Stable and doing well with very PRN claritin.  11. Osteopenia of multiple sites - She will call to schedule - has contact information for [place] D 25 Hydroxy (Vit-D Deficiency, Fractures)  12. Vitamin D deficiency disease - VITAMIN D 25 Hydroxy (Vit-D Deficiency, Fractures)  13. Obesity (BMI 30.0-34.9) - Discussed importance of 150 minutes of physical activity weekly, eat two servings of fish weekly, eat one serving of tree nuts ( cashews, pistachios, pecans, almonds.Marland Kitchen) every other day,  eat 6 servings of fruit/vegetables daily and drink plenty of water and avoid sweet beverages.    I discussed the assessment and treatment plan with the patient.  The patient was provided an opportunity to ask questions and all were answered. The patient agreed with the plan and demonstrated an understanding of the instructions.   The patient was advised to call back or seek an in-person evaluation if the symptoms worsen or if the condition fails to improve as anticipated.  I provided 20 minutes of non-face-to-face time during this encounter.  Doren Custard, FNP

## 2019-02-25 DIAGNOSIS — N182 Chronic kidney disease, stage 2 (mild): Secondary | ICD-10-CM | POA: Diagnosis not present

## 2019-02-25 DIAGNOSIS — I10 Essential (primary) hypertension: Secondary | ICD-10-CM | POA: Diagnosis not present

## 2019-02-25 DIAGNOSIS — E785 Hyperlipidemia, unspecified: Secondary | ICD-10-CM | POA: Diagnosis not present

## 2019-02-25 DIAGNOSIS — I700 Atherosclerosis of aorta: Secondary | ICD-10-CM | POA: Diagnosis not present

## 2019-02-25 DIAGNOSIS — I739 Peripheral vascular disease, unspecified: Secondary | ICD-10-CM | POA: Diagnosis not present

## 2019-02-25 DIAGNOSIS — E559 Vitamin D deficiency, unspecified: Secondary | ICD-10-CM | POA: Diagnosis not present

## 2019-02-25 DIAGNOSIS — M8589 Other specified disorders of bone density and structure, multiple sites: Secondary | ICD-10-CM | POA: Diagnosis not present

## 2019-02-26 ENCOUNTER — Other Ambulatory Visit: Payer: Self-pay | Admitting: Family Medicine

## 2019-02-26 ENCOUNTER — Other Ambulatory Visit: Payer: Self-pay | Admitting: Emergency Medicine

## 2019-02-26 DIAGNOSIS — E2839 Other primary ovarian failure: Secondary | ICD-10-CM

## 2019-02-26 DIAGNOSIS — M8589 Other specified disorders of bone density and structure, multiple sites: Secondary | ICD-10-CM

## 2019-02-26 LAB — COMPLETE METABOLIC PANEL WITHOUT GFR
AG Ratio: 1.8 (calc) (ref 1.0–2.5)
ALT: 26 U/L (ref 6–29)
AST: 24 U/L (ref 10–35)
Albumin: 4.1 g/dL (ref 3.6–5.1)
Alkaline phosphatase (APISO): 68 U/L (ref 37–153)
BUN/Creatinine Ratio: 19 (calc) (ref 6–22)
BUN: 20 mg/dL (ref 7–25)
CO2: 28 mmol/L (ref 20–32)
Calcium: 9.7 mg/dL (ref 8.6–10.4)
Chloride: 103 mmol/L (ref 98–110)
Creat: 1.05 mg/dL — ABNORMAL HIGH (ref 0.60–0.93)
GFR, Est African American: 61 mL/min/{1.73_m2}
GFR, Est Non African American: 52 mL/min/{1.73_m2} — ABNORMAL LOW
Globulin: 2.3 g/dL (ref 1.9–3.7)
Glucose, Bld: 106 mg/dL — ABNORMAL HIGH (ref 65–99)
Potassium: 4.3 mmol/L (ref 3.5–5.3)
Sodium: 140 mmol/L (ref 135–146)
Total Bilirubin: 0.5 mg/dL (ref 0.2–1.2)
Total Protein: 6.4 g/dL (ref 6.1–8.1)

## 2019-02-26 LAB — LIPID PANEL
Cholesterol: 147 mg/dL
HDL: 53 mg/dL
LDL Cholesterol (Calc): 70 mg/dL
Non-HDL Cholesterol (Calc): 94 mg/dL
Total CHOL/HDL Ratio: 2.8 (calc)
Triglycerides: 153 mg/dL — ABNORMAL HIGH

## 2019-02-26 LAB — VITAMIN D 25 HYDROXY (VIT D DEFICIENCY, FRACTURES): Vit D, 25-Hydroxy: 69 ng/mL (ref 30–100)

## 2019-04-02 DIAGNOSIS — R69 Illness, unspecified: Secondary | ICD-10-CM | POA: Diagnosis not present

## 2019-04-22 ENCOUNTER — Other Ambulatory Visit: Payer: Self-pay | Admitting: Family Medicine

## 2019-04-23 ENCOUNTER — Other Ambulatory Visit: Payer: Self-pay | Admitting: Emergency Medicine

## 2019-04-25 ENCOUNTER — Telehealth: Payer: Self-pay

## 2019-04-25 NOTE — Telephone Encounter (Signed)
Patient called to ask if she needed to have Covid test done before getting her Covid vaccine. Per Maurice Small, NP patient does not need test beforehand. I spoke with the patient and she states she already had test done because her husband had to have one. Patient has no further questions.

## 2019-05-01 ENCOUNTER — Telehealth: Payer: Self-pay | Admitting: Family Medicine

## 2019-05-01 NOTE — Telephone Encounter (Signed)
Per Irving Burton ok to do get Covid vaccine

## 2019-05-01 NOTE — Telephone Encounter (Signed)
Patient would like to discuss covid 19 vaccine with PCP or CMA. Patient would specifically like to know if she should get the vaccine with her current health conditions. Patient is already scheduled for vaccine.

## 2019-07-03 ENCOUNTER — Ambulatory Visit: Payer: Medicare HMO

## 2019-07-10 ENCOUNTER — Other Ambulatory Visit: Payer: Self-pay

## 2019-07-10 ENCOUNTER — Ambulatory Visit (INDEPENDENT_AMBULATORY_CARE_PROVIDER_SITE_OTHER): Payer: Medicare Other

## 2019-07-10 ENCOUNTER — Ambulatory Visit (INDEPENDENT_AMBULATORY_CARE_PROVIDER_SITE_OTHER): Payer: Medicare Other | Admitting: Family Medicine

## 2019-07-10 ENCOUNTER — Encounter: Payer: Self-pay | Admitting: Family Medicine

## 2019-07-10 VITALS — BP 126/78 | HR 81 | Temp 97.8°F | Resp 14 | Ht 67.0 in | Wt 213.1 lb

## 2019-07-10 DIAGNOSIS — H6091 Unspecified otitis externa, right ear: Secondary | ICD-10-CM | POA: Diagnosis not present

## 2019-07-10 DIAGNOSIS — H6123 Impacted cerumen, bilateral: Secondary | ICD-10-CM | POA: Diagnosis not present

## 2019-07-10 DIAGNOSIS — Z1211 Encounter for screening for malignant neoplasm of colon: Secondary | ICD-10-CM

## 2019-07-10 DIAGNOSIS — J310 Chronic rhinitis: Secondary | ICD-10-CM | POA: Diagnosis not present

## 2019-07-10 DIAGNOSIS — Z0000 Encounter for general adult medical examination without abnormal findings: Secondary | ICD-10-CM | POA: Diagnosis not present

## 2019-07-10 MED ORDER — NEOMYCIN-POLYMYXIN-HC 3.5-10000-1 OT SOLN: 4.0000 [drp] | Freq: Four times a day (QID) | OTIC | 0 refills | Status: AC

## 2019-07-10 MED ORDER — ZYRTEC ALLERGY 10 MG PO TBDP: 10.0000 mg | ORAL_TABLET | Freq: Every day | ORAL | 0 refills | Status: DC

## 2019-07-10 MED ORDER — FLUTICASONE PROPIONATE 50 MCG/ACT NA SUSP: 2.0000 | Freq: Every day | NASAL | 6 refills | Status: DC

## 2019-07-10 NOTE — Patient Instructions (Signed)
[name] , Thank you for taking time to come for your Medicare Wellness Visit. I appreciate your ongoing commitment to your health goals. Please review the following plan we discussed and let me know if I can assist you in the future.   Screening recommendations/referrals: Colonoscopy: done 2016. Referral sent to [facility] today for repeat screening colonoscopy.  Mammogram: done 01/23/19 Bone Density: done 07/18/16. Please call [phone] to schedule your bone density screening.  Recommended yearly ophthalmology/optometry visit for glaucoma screening and checkup Recommended yearly dental visit for hygiene and checkup  Vaccinations: Influenza vaccine: done 12/03/18 Pneumococcal vaccine: done 01/10/14 Tdap vaccine: done 01/10/14 Shingles vaccine: Shingrix discussed. Please contact your pharmacy for coverage information.  Covid-19: done 05/06/19 & 06/03/19  Advanced directives: Advance directive discussed with you today. I have provided a copy for you to complete at home and have notarized. Once this is complete please bring a copy in to our office so we can scan it into your chart.  Conditions/risks identified:  Recommend increasing physical activity  Next appointment: Please follow up in one year for your Medicare Annual Wellness visit.     Preventive Care 75 Years and Older, Female Preventive care refers to lifestyle choices and visits with your health care provider that can promote health and wellness. What does preventive care include?  A yearly physical exam. This is also called an annual well check.  Dental exams once or twice a year.  Routine eye exams. Ask your health care provider how often you should have your eyes checked.  Personal lifestyle choices, including:  Daily care of your teeth and gums.  Regular physical activity.  Eating a healthy diet.  Avoiding tobacco and drug use.  Limiting alcohol use.  Practicing safe sex.  Taking low-dose  aspirin every day.  Taking vitamin and mineral supplements as recommended by your health care provider. What happens during an annual well check? The services and screenings done by your health care provider during your annual well check will depend on your age, overall health, lifestyle risk factors, and family history of disease. Counseling  Your health care provider may ask you questions about your:  Alcohol use.  Tobacco use.  Drug use.  Emotional well-being.  Home and relationship well-being.  Sexual activity.  Eating habits.  History of falls.  Memory and ability to understand (cognition).  Work and work Astronomer.  Reproductive health. Screening  You may have the following tests or measurements:  Height, weight, and BMI.  Blood pressure.  Lipid and cholesterol levels. These may be checked every 5 years, or more frequently if you are over 75 years old.  Skin check.  Lung cancer screening. You may have this screening every year starting at age 75 if you have a 30-pack-year history of smoking and currently smoke or have quit within the past 15 years.  Fecal occult blood test (FOBT) of the stool. You may have this test every year starting at age 75.  Flexible sigmoidoscopy or colonoscopy. You may have a sigmoidoscopy every 5 years or a colonoscopy every 10 years starting at age 75.  Hepatitis C blood test.  Hepatitis B blood test.  Sexually transmitted disease (STD) testing.  Diabetes screening. This is done by checking your blood sugar (glucose) after you have not eaten for a while (fasting). You may have this done every 1-3 years.  Bone density scan. This is done to screen for osteoporosis. You may have this done starting at age 75.  Mammogram. This  may be done every 1-2 years. Talk to your health care provider about how often you should have regular mammograms. Talk with your health care provider about your test results, treatment options, and if  necessary, the need for more tests. Vaccines  Your health care provider may recommend certain vaccines, such as:  Influenza vaccine. This is recommended every year.  Tetanus, diphtheria, and acellular pertussis (Tdap, Td) vaccine. You may need a Td booster every 10 years.  Zoster vaccine. You may need this after age 75.  Pneumococcal 13-valent conjugate (PCV13) vaccine. One dose is recommended after age 75.  Pneumococcal polysaccharide (PPSV23) vaccine. One dose is recommended after age 75. Talk to your health care provider about which screenings and vaccines you need and how often you need them. This information is not intended to replace advice given to you by your health care provider. Make sure you discuss any questions you have with your health care provider. Document Released: 04/23/2015 Document Revised: 12/15/2015 Document Reviewed: 01/26/2015 Elsevier Interactive Patient Education  2017 [place] Prevention in the Home Falls can cause injuries. They can happen to people of all ages. There are many things you can do to make your home safe and to help prevent falls. What can I do on the outside of my home?  Regularly fix the edges of walkways and driveways and fix any cracks.  Remove anything that might make you trip as you walk through a door, such as a raised step or threshold.  Trim any bushes or trees on the path to your home.  Use bright outdoor lighting.  Clear any walking paths of anything that might make someone trip, such as rocks or tools.  Regularly check to see if handrails are loose or broken. Make sure that both sides of any steps have handrails.  Any raised decks and porches should have guardrails on the edges.  Have any leaves, snow, or ice cleared regularly.  Use sand or salt on walking paths during winter.  Clean up any spills in your garage right away. This includes oil or grease spills. What can I do in the bathroom?  Use night  lights.  Install grab bars by the toilet and in the tub and shower. Do not use towel bars as grab bars.  Use non-skid mats or decals in the tub or shower.  If you need to sit down in the shower, use a plastic, non-slip stool.  Keep the floor dry. Clean up any water that spills on the floor as soon as it happens.  Remove soap buildup in the tub or shower regularly.  Attach bath mats securely with double-sided non-slip rug tape.  Do not have throw rugs and other things on the floor that can make you trip. What can I do in the bedroom?  Use night lights.  Make sure that you have a light by your bed that is easy to reach.  Do not use any sheets or blankets that are too big for your bed. They should not hang down onto the floor.  Have a firm chair that has side arms. You can use this for support while you get dressed.  Do not have throw rugs and other things on the floor that can make you trip. What can I do in the kitchen?  Clean up any spills right away.  Avoid walking on wet floors.  Keep items that you use a lot in easy-to-reach places.  If you need to reach something above  you, use a strong step stool that has a grab bar.  Keep electrical cords out of the way.  Do not use floor polish or wax that makes floors slippery. If you must use wax, use non-skid floor wax.  Do not have throw rugs and other things on the floor that can make you trip. What can I do with my stairs?  Do not leave any items on the stairs.  Make sure that there are handrails on both sides of the stairs and use them. Fix handrails that are broken or loose. Make sure that handrails are as long as the stairways.  Check any carpeting to make sure that it is firmly attached to the stairs. Fix any carpet that is loose or worn.  Avoid having throw rugs at the top or bottom of the stairs. If you do have throw rugs, attach them to the floor with carpet tape.  Make sure that you have a light switch at the  top of the stairs and the bottom of the stairs. If you do not have them, ask someone to add them for you. What else can I do to help prevent falls?  Wear shoes that:  Do not have high heels.  Have rubber bottoms.  Are comfortable and fit you well.  Are closed at the toe. Do not wear sandals.  If you use a stepladder:  Make sure that it is fully opened. Do not climb a closed stepladder.  Make sure that both sides of the stepladder are locked into place.  Ask someone to hold it for you, if possible.  Clearly mark and make sure that you can see:  Any grab bars or handrails.  First and last steps.  Where the edge of each step is.  Use tools that help you move around (mobility aids) if they are needed. These include:  Canes.  Walkers.  Scooters.  Crutches.  Turn on the lights when you go into a dark area. Replace any light bulbs as soon as they burn out.  Set up your furniture so you have a clear path. Avoid moving your furniture around.  If any of your floors are uneven, fix them.  If there are any pets around you, be aware of where they are.  Review your medicines with your doctor. Some medicines can make you feel dizzy. This can increase your chance of falling. Ask your doctor what other things that you can do to help prevent falls. This information is not intended to replace advice given to you by your health care provider. Make sure you discuss any questions you have with your health care provider. Document Released: 01/21/2009 Document Revised: 09/02/2015 Document Reviewed: 05/01/2014 Elsevier Interactive Patient Education  2017 [employer].

## 2019-07-10 NOTE — Progress Notes (Signed)
Subjective:   Dana Peters is a 74 y.o. female who presents for Medicare Annual (Subsequent) preventive examination.  Review of Systems:   Cardiac Risk Factors include: advanced age (>19men, >107 women);dyslipidemia;obesity (BMI >30kg/m2);hypertension     Objective:     Vitals: BP 126/78   Pulse 81   Temp 97.8 F (36.6 C) (Temporal)   Resp 14   Ht 5\' 7"  (1.702 m)   Wt 213 lb 1.6 oz (96.7 kg)   LMP  (LMP Unknown)   SpO2 92%   BMI 33.38 kg/m   Body mass index is 33.38 kg/m.  Advanced Directives 07/10/2019 06/27/2018 04/28/2018 05/28/2017 01/19/2017 10/26/2016 10/05/2016  Does Patient Have a Medical Advance Directive? No Yes No Yes Yes No No  Type of Advance Directive - Living will;Healthcare Power of Attorney - Living will;Healthcare Power of Attorney - - -  Copy of Healthcare Power of Attorney in Chart? - No - copy requested - - - - -  Would patient like information on creating a medical advance directive? Yes (MAU/Ambulatory/Procedural Areas - Information given) - - - - - -    Tobacco Social History   Tobacco Use  Smoking Status Former Smoker  . Packs/day: 1.00  . Years: 30.00  . Pack years: 30.00  . Types: Cigarettes  . Quit date: 10/13/2005  . Years since quitting: 13.7  Smokeless Tobacco Former 12/14/2005  . Types: Snuff  . Quit date: 10/09/2011     Counseling given: Not Answered   Clinical Intake:  Pre-visit preparation completed: Yes  Pain : No/denies pain     BMI - recorded: 33.38 Nutritional Status: BMI > 30  Obese Nutritional Risks: None Diabetes: No  How often do you need to have someone help you when you read instructions, pamphlets, or other written materials from your doctor or pharmacy?: 1 - Never  Interpreter Needed?: No  Information entered by :: [id number] LPN  Past Medical History:  Diagnosis Date  . Allergy   . Baker's cyst of knee, right 06/04/2017   06/06/2017 Feb 2019  . CKD (chronic kidney disease) stage 2, GFR 60-89 ml/min   .  Dyslipidemia   . GERD (gastroesophageal reflux disease)   . Hypertension   . Lumbar spinal stenosis   . Neuralgia neuritis, sciatic nerve 08/28/2012  . Neuropathy    both feet  . Obesity (BMI 30.0-34.9) 05/29/2016  . Osteopenia 06/15/2016   March 2018  . Personal history of tobacco use, presenting hazards to health 07/14/2015  . Reflux   . Sciatica   . Thoracic aortic atherosclerosis (HCC) 08/01/2016   Chest CT  . Venous stasis   . Vitamin D deficiency disease    Past Surgical History:  Procedure Laterality Date  . ABDOMINAL HYSTERECTOMY     complete, due to heavy  . CATARACT EXTRACTION, BILATERAL    . ECTOPIC PREGNANCY SURGERY     Family History  Problem Relation Age of Onset  . Cancer Mother        colon  . Hypertension Mother   . Alzheimer's disease Mother   . Cancer Father        prostate  . Cancer Brother        lung  . Stroke Brother   . Cancer Brother        prostate and brain  . Cancer Brother        throat  . Diabetes Paternal Aunt   . Breast cancer Paternal Aunt   . Healthy Daughter   .  Cirrhosis Brother    Social History   Socioeconomic History  . Marital status: Married    Spouse name: Not on file  . Number of children: 1  . Years of education: Not on file  . Highest education level: 12th grade  Occupational History  . Occupation: retired  Tobacco Use  . Smoking status: Former Smoker    Packs/day: 1.00    Years: 30.00    Pack years: 30.00    Types: Cigarettes    Quit date: 10/13/2005    Years since quitting: 13.7  . Smokeless tobacco: Former Neurosurgeon    Types: Snuff    Quit date: 10/09/2011  Substance and Sexual Activity  . Alcohol use: No    Alcohol/week: 0.0 standard drinks  . Drug use: No  . Sexual activity: Yes  Other Topics Concern  . Not on file  Social History Narrative   Lives with husband in a one story home.  Has one daughter.     Retired from [employer] work.     Education: high school.   Social Determinants of Health   Financial  Resource Strain: Low Risk   . Difficulty of Paying Living Expenses: Not very hard  Food Insecurity: No Food Insecurity  . Worried About Programme researcher, broadcasting/film/video in the Last Year: Never true  . Ran Out of Food in the Last Year: Never true  Transportation Needs: No Transportation Needs  . Lack of Transportation (Medical): No  . Lack of Transportation (Non-Medical): No  Physical Activity: Inactive  . Days of Exercise per Week: 0 days  . Minutes of Exercise per Session: 0 min  Stress: No Stress Concern Present  . Feeling of Stress : Only a little  Social Connections: Slightly Isolated  . Frequency of Communication with Friends and Family: More than three times a week  . Frequency of Social Gatherings with Friends and Family: Three times a week  . Attends Religious Services: More than 4 times per year  . Active Member of Clubs or Organizations: No  . Attends Banker Meetings: Never  . Marital Status: Married    Outpatient Encounter Medications as of 07/10/2019  Medication Sig  . aspirin EC 81 MG tablet Take 1 tablet (81 mg total) by mouth daily.  . Cholecalciferol (VITAMIN D) 2000 units CAPS Take by mouth daily.  . DULoxetine (CYMBALTA) 30 MG capsule Take 1 capsule by mouth once daily  . ezetimibe (ZETIA) 10 MG tablet Take 1 tablet (10 mg total) by mouth daily.  . hydrochlorothiazide (HYDRODIURIL) 12.5 MG tablet Take 1 tablet (12.5 mg total) by mouth daily.  Marland Kitchen loratadine (CLARITIN) 10 MG tablet Take 1 tablet (10 mg total) by mouth daily as needed.   No facility-administered encounter medications on file as of 07/10/2019.    Activities of Daily Living In your present state of health, do you have any difficulty performing the following activities: 07/10/2019 07/10/2019  Hearing? Malvin Johns  Comment looking into hearing aids -  Vision? N N  Difficulty concentrating or making decisions? N N  Walking or climbing stairs? N N  Dressing or bathing? N N  Doing errands, shopping? N N  Preparing  Food and eating ? N -  Using the Toilet? N -  In the past six months, have you accidently leaked urine? N -  Do you have problems with loss of bowel control? N -  Managing your Medications? N -  Managing your Finances? N -  Housekeeping or managing your  Housekeeping? N -  Some recent data might be hidden    Patient Care Team: Danelle Berry, PA-C as PCP - General (Family Medicine) Antonieta Iba, MD as Consulting Physician (Cardiology)    Assessment:   This is a routine wellness examination for [place].  Exercise Activities and Dietary recommendations Current Exercise Habits: The patient does not participate in regular exercise at present, Exercise limited by: neurologic condition(s)  Goals    . Increase physical activity (pt-stated)       Fall Risk Fall Risk  07/10/2019 07/10/2019 02/21/2019 12/03/2018 10/18/2018  Falls in the past year? 0 0 0 0 0  Number falls in past yr: 0 0 0 0 0  Injury with Fall? 0 0 0 0 0  Risk for fall due to : No Fall Risks - - - -  Follow up Falls prevention discussed - Falls evaluation completed - -   FALL RISK PREVENTION PERTAINING TO THE HOME:  Any stairs in or around the home? Yes  If so, do they handrails? No   2 steps outside  Home free of loose throw rugs in walkways, pet beds, electrical cords, etc? Yes  Adequate lighting in your home to reduce risk of falls? Yes   ASSISTIVE DEVICES UTILIZED TO PREVENT FALLS:  Life alert? No  Use of a cane, walker or w/c? No  Grab bars in the bathroom? No  Shower chair or bench in shower? No  Elevated toilet seat or a handicapped toilet? Yes   DME ORDERS:  DME order needed?  No   TIMED UP AND GO:  Was the test performed? Yes .  Length of time to ambulate 10 feet: 5 sec.   GAIT:  Appearance of gait: Gait stead-fast and without the use of an assistive device.    Education: Fall risk prevention has been discussed.  Intervention(s) required? No   Depression Screen PHQ 2/9 Scores 07/10/2019  07/10/2019 02/21/2019 12/03/2018  PHQ - 2 Score 0 0 0 0  PHQ- 9 Score - 0 0 1     Cognitive Function     6CIT Screen 07/10/2019 06/27/2018 06/25/2017 06/15/2016  What Year? 0 points 0 points 0 points 0 points  What month? 0 points 0 points 0 points 0 points  What time? 0 points 0 points 0 points 0 points  Count back from 20 0 points 0 points 0 points 0 points  Months in reverse 2 points 0 points 2 points 0 points  Repeat phrase 2 points 2 points 0 points 2 points  Total Score 4 2 2 2     Immunization History  Administered Date(s) Administered  . Fluad Quad(high Dose 65+) 12/03/2018  . Influenza, High Dose Seasonal PF 01/19/2017  . Influenza, Quadrivalent, Recombinant, Inj, Pf 01/24/2018  . Influenza-Unspecified 03/18/2014, 01/05/2015, 01/03/2016  . Moderna SARS-COVID-2 Vaccination 05/06/2019, 06/03/2019  . Pneumococcal Conjugate-13 11/18/2013  . Pneumococcal Polysaccharide-23 05/15/2012, 01/10/2014  . Td 01/10/2014  . Tdap 11/22/2011  . Zoster 01/17/2012, 05/22/2014    Qualifies for Shingles Vaccine? Yes . Zostavax completed 2016. Due for Shingrix. Education has been provided regarding the importance of this vaccine. Pt has been advised to call insurance company to determine out of pocket expense. Advised may also receive vaccine at local pharmacy or Health Dept. Verbalized acceptance and understanding.  Tdap: Up to date  Flu Vaccine: Up to date  Pneumococcal Vaccine: Up to date   Screening Tests Health Maintenance  Topic Date Due  . DEXA SCAN  07/19/2018  . COLONOSCOPY  07/10/2019  . INFLUENZA VACCINE  11/09/2019  . MAMMOGRAM  01/23/2020  . TETANUS/TDAP  01/11/2024  . Hepatitis C Screening  Completed  . PNA vac Low Risk Adult  Completed    Cancer Screenings:  Colorectal Screening: Completed 07/2014 Repeat every 5 years. Referral to GI placed today. Pt aware the office will call re: appt.  Mammogram: Completed 01/23/19. Repeat every year;   Bone Density: Completed  07/18/16. Results reflect  OSTEOPENIA. Repeat every 2 years. Ordered 02/26/19. Pt provided with contact information and advised to call to schedule appt.   Lung Cancer Screening: (Low Dose CT Chest recommended if Age 55-80 years, 30 pack-year currently smoking OR have quit w/in 15years.) does not qualify.   Additional Screening:  Hepatitis C Screening: does qualify; Completed 05/07/15  Vision Screening: Recommended annual ophthalmology exams for early detection of glaucoma and other disorders of the eye. Is the patient up to date with their annual eye exam?  Yes  Who is the provider or what is the name of the office in which the pt attends annual eye exams? [place] Screening: Recommended annual dental exams for proper oral hygiene  Community Resource Referral:  CRR required this visit?  No      Plan:     I have personally reviewed and addressed the Medicare Annual Wellness questionnaire and have noted the following in the patient's chart:  A. Medical and social history B. Use of alcohol, tobacco or illicit drugs  C. Current medications and supplements D. Functional ability and status E.  Nutritional status F.  Physical activity G. Advance directives H. List of other physicians I.  Hospitalizations, surgeries, and ER visits in previous 12 months J.  [place] such as hearing and vision if needed, cognitive and depression L. Referrals and appointments   In addition, I have reviewed and discussed with patient certain preventive protocols, quality metrics, and best practice recommendations. A written personalized care plan for preventive services as well as general preventive health recommendations were provided to patient.   Signed,  Clemetine Marker, LPN Nurse Health Advisor   Nurse Notes: pt c/o neuropathy pain in feet with numbness and slight tingling. Pt so schedule follow up with Leisa.

## 2019-07-10 NOTE — Patient Instructions (Addendum)
If your right ear hurts then use the drops for a few days - they will help with any swelling/inflammation  Since your ear drum looks a little dull you can have some nasal or sinus swelling or fluid that makes it do that -   Use a Steroid nasal spray and an antihistamine    Earwax Buildup, Adult The ears produce a substance called earwax that helps keep bacteria out of the ear and protects the skin in the ear canal. Occasionally, earwax can build up in the ear and cause discomfort or hearing loss. What increases the risk? This condition is more likely to develop in people who:  Are female.  Are elderly.  Naturally produce more earwax.  Clean their ears often with cotton swabs.  Use earplugs often.  Use in-ear headphones often.  Wear hearing aids.  Have narrow ear canals.  Have earwax that is overly thick or sticky.  Have eczema.  Are dehydrated.  Have excess hair in the ear canal. What are the signs or symptoms? Symptoms of this condition include:  Reduced or muffled hearing.  A feeling of fullness in the ear or feeling that the ear is plugged.  Fluid coming from the ear.  Ear pain.  Ear itch.  Ringing in the ear.  Coughing.  An obvious piece of earwax that can be seen inside the ear canal. How is this diagnosed? This condition may be diagnosed based on:  Your symptoms.  Your medical history.  An ear exam. During the exam, your health care provider will look into your ear with an instrument called an otoscope. You may have tests, including a hearing test. How is this treated? This condition may be treated by:  Using ear drops to soften the earwax.  Having the earwax removed by a health care provider. The health care provider may: ? Flush the ear with water. ? Use an instrument that has a loop on the end (curette). ? Use a suction device.  Surgery to remove the wax buildup. This may be done in severe cases. Follow these instructions at  home:   Take over-the-counter and prescription medicines only as told by your health care provider.  Do not put any objects, including cotton swabs, into your ear. You can clean the opening of your ear canal with a washcloth or facial tissue.  Follow instructions from your health care provider about cleaning your ears. Do not over-clean your ears.  Drink enough fluid to keep your urine clear or pale yellow. This will help to thin the earwax.  Keep all follow-up visits as told by your health care provider. If earwax builds up in your ears often or if you use hearing aids, consider seeing your health care provider for routine, preventive ear cleanings. Ask your health care provider how often you should schedule your cleanings.  If you have hearing aids, clean them according to instructions from the manufacturer and your health care provider. Contact a health care provider if:  You have ear pain.  You develop a fever.  You have blood, pus, or other fluid coming from your ear.  You have hearing loss.  You have ringing in your ears that does not go away.  Your symptoms do not improve with treatment.  You feel like the room is spinning (vertigo). Summary  Earwax can build up in the ear and cause discomfort or hearing loss.  The most common symptoms of this condition include reduced or muffled hearing and a feeling of  fullness in the ear or feeling that the ear is plugged.  This condition may be diagnosed based on your symptoms, your medical history, and an ear exam.  This condition may be treated by using ear drops to soften the earwax or by having the earwax removed by a health care provider.  Do not put any objects, including cotton swabs, into your ear. You can clean the opening of your ear canal with a washcloth or facial tissue. This information is not intended to replace advice given to you by your health care provider. Make sure you discuss any questions you have with your  health care provider. Document Revised: 03/09/2017 Document Reviewed: 06/07/2016 Elsevier Patient Education  2020 [employer]   Eustachian Tube Dysfunction  Eustachian tube dysfunction refers to a condition in which a blockage develops in the narrow passage that connects the middle ear to the back of the nose (eustachian tube). The eustachian tube regulates air pressure in the middle ear by letting air move between the ear and nose. It also helps to drain fluid from the middle ear space. Eustachian tube dysfunction can affect one or both ears. When the eustachian tube does not function properly, air pressure, fluid, or both can build up in the middle ear. What are the causes? This condition occurs when the eustachian tube becomes blocked or cannot open normally. Common causes of this condition include:  Ear infections.  Colds and other infections that affect the nose, mouth, and throat (upper respiratory tract).  Allergies.  Irritation from cigarette smoke.  Irritation from stomach acid coming up into the esophagus (gastroesophageal reflux). The esophagus is the tube that carries food from the mouth to the stomach.  Sudden changes in air pressure, such as from descending in an airplane or scuba diving.  Abnormal growths in the nose or throat, such as: ? Growths that line the nose (nasal polyps). ? Abnormal growth of cells (tumors). ? Enlarged tissue at the back of the throat (adenoids). What increases the risk? You are more likely to develop this condition if:  You smoke.  You are overweight.  You are a child who has: ? Certain birth defects of the mouth, such as cleft palate. ? Large tonsils or adenoids. What are the signs or symptoms? Common symptoms of this condition include:  A feeling of fullness in the ear.  Ear pain.  Clicking or popping noises in the ear.  Ringing in the ear.  Hearing loss.  Loss of balance.  Dizziness. Symptoms may get worse when the  air pressure around you changes, such as when you travel to an area of high elevation, fly on an airplane, or go scuba diving. How is this diagnosed? This condition may be diagnosed based on:  Your symptoms.  A physical exam of your ears, nose, and throat.  Tests, such as those that measure: ? The movement of your eardrum (tympanogram). ? Your hearing (audiometry). How is this treated? Treatment depends on the cause and severity of your condition.  In mild cases, you may relieve your symptoms by moving air into your ears. This is called "popping the ears."  In more severe cases, or if you have symptoms of fluid in your ears, treatment may include: ? Medicines to relieve congestion (decongestants). ? Medicines that treat allergies (antihistamines). ? Nasal sprays or ear drops that contain medicines that reduce swelling (steroids). ? A procedure to drain the fluid in your eardrum (myringotomy). In this procedure, a small tube is placed in  the eardrum to:  Drain the fluid.  Restore the air in the middle ear space. ? A procedure to insert a balloon device through the nose to inflate the opening of the eustachian tube (balloon dilation). Follow these instructions at home: Lifestyle  Do not do any of the following until your health care provider approves: ? Travel to high altitudes. ? Fly in airplanes. ? Work in a Estate agent or room. ? Scuba dive.  Do not use any products that contain nicotine or tobacco, such as cigarettes and e-cigarettes. If you need help quitting, ask your health care provider.  Keep your ears dry. Wear fitted earplugs during showering and bathing. Dry your ears completely after. General instructions  Take over-the-counter and prescription medicines only as told by your health care provider.  Use techniques to help pop your ears as recommended by your health care provider. These may include: ? Chewing gum. ? Yawning. ? Frequent, forceful  swallowing. ? Closing your mouth, holding your nose closed, and gently blowing as if you are trying to blow air out of your nose.  Keep all follow-up visits as told by your health care provider. This is important. Contact a health care provider if:  Your symptoms do not go away after treatment.  Your symptoms come back after treatment.  You are unable to pop your ears.  You have: ? A fever. ? Pain in your ear. ? Pain in your head or neck. ? Fluid draining from your ear.  Your hearing suddenly changes.  You become very dizzy.  You lose your balance. Summary  Eustachian tube dysfunction refers to a condition in which a blockage develops in the eustachian tube.  It can be caused by ear infections, allergies, inhaled irritants, or abnormal growths in the nose or throat.  Symptoms include ear pain, hearing loss, or ringing in the ears.  Mild cases are treated with maneuvers to unblock the ears, such as yawning or ear popping.  Severe cases are treated with medicines. Surgery may also be done (rare). This information is not intended to replace advice given to you by your health care provider. Make sure you discuss any questions you have with your health care provider. Document Revised: 07/17/2017 Document Reviewed: 07/17/2017 Elsevier Patient Education  2020 [employer].

## 2019-07-22 ENCOUNTER — Other Ambulatory Visit: Payer: Self-pay

## 2019-07-22 ENCOUNTER — Other Ambulatory Visit: Payer: Self-pay | Admitting: Family Medicine

## 2019-07-22 ENCOUNTER — Encounter: Payer: Self-pay | Admitting: Family Medicine

## 2019-07-22 DIAGNOSIS — Z1231 Encounter for screening mammogram for malignant neoplasm of breast: Secondary | ICD-10-CM

## 2019-07-22 DIAGNOSIS — Z1211 Encounter for screening for malignant neoplasm of colon: Secondary | ICD-10-CM

## 2019-07-22 NOTE — Progress Notes (Signed)
Patient ID: Dana Peters, female    DOB: 1946-01-03, 74 y.o.   MRN: [id number]  PCP: Danelle Berry, PA-C  Chief Complaint  Patient presents with  . clogged ear    Subjective:   Dana Peters is a 74 y.o. female, presents to clinic with CC of the following:  HPI  Patient presents for cerumen impaction of both ears she is having decreased hearing, symptoms slightly more bothersome on the right side than on the left.  She is also having some nasal congestion nasal discharge and allergy symptoms No severe pain to ears, sinuses, denies fever chills, sore throat, tinitis, ear discharge.  She does not use q-tips to ears.  She was trying to get fitted for hearing aids and they told her to come to PCP to get ears cleaned out first.  Patient Active Problem List   Diagnosis Date Noted  . Bilateral lower extremity edema 09/27/2017  . Emphysema of lung (HCC) 08/14/2017  . Ganglion cyst of finger 06/25/2017  . Full code status 06/25/2017  . Osteoarthritis of knee 06/12/2017  . Baker's cyst of knee, right 06/04/2017  . Thoracic aortic atherosclerosis (HCC) 08/01/2016  . Osteopenia 06/15/2016  . Obesity (BMI 30.0-34.9) 05/29/2016  . Urinary hesitancy 02/14/2016  . PAD (peripheral artery disease) (HCC) 09/09/2015  . Coronary artery calcification seen on CT scan 07/20/2015  . Personal history of tobacco use, presenting hazards to health 07/14/2015  . Foraminal stenosis of lumbar region 01/28/2015  . Facet arthropathy, lumbar 01/28/2015  . Spinal stenosis of lumbar region 01/28/2015  . Neuropathy of both feet 01/07/2015  . Leg pain, bilateral 01/07/2015  . Leg heaviness 01/07/2015  . CKD (chronic kidney disease) stage 2, GFR 60-89 ml/min 10/17/2014  . Allergic rhinitis 10/14/2014  . Dyslipidemia 10/14/2014  . Hypertension   . Vitamin D deficiency disease   . GERD (gastroesophageal reflux disease) 02/02/2014      Current Outpatient Medications:  .  aspirin EC 81 MG tablet,  Take 1 tablet (81 mg total) by mouth daily., Disp: 30 tablet, Rfl: 11 .  Cholecalciferol (VITAMIN D) 2000 units CAPS, Take by mouth daily., Disp: , Rfl:  .  DULoxetine (CYMBALTA) 30 MG capsule, Take 1 capsule by mouth once daily, Disp: 90 capsule, Rfl: 0 .  ezetimibe (ZETIA) 10 MG tablet, Take 1 tablet (10 mg total) by mouth daily., Disp: 90 tablet, Rfl: 3 .  hydrochlorothiazide (HYDRODIURIL) 12.5 MG tablet, Take 1 tablet (12.5 mg total) by mouth daily., Disp: 90 tablet, Rfl: 3 .  loratadine (CLARITIN) 10 MG tablet, Take 1 tablet (10 mg total) by mouth daily as needed., Disp: 90 tablet, Rfl: 1 .  Cetirizine HCl (ZYRTEC ALLERGY) 10 MG TBDP, Take 10 mg by mouth at bedtime., Disp: 30 tablet, Rfl: 0 .  fluticasone (FLONASE) 50 MCG/ACT nasal spray, Place 2 sprays into both nostrils daily., Disp: 16 g, Rfl: 6   Allergies  Allergen Reactions  . Atorvastatin Itching  . Codeine Sulfate Itching  . Pravastatin Nausea Only  . Protonix [Pantoprazole Sodium] Other (See Comments)    Legs felt heavy  . Tylenol With Codeine #3  [Acetaminophen-Codeine] Itching  . Penicillin G Rash     Family History  Problem Relation Age of Onset  . Cancer Mother        colon  . Hypertension Mother   . Alzheimer's disease Mother   . Cancer Father        prostate  . Cancer Brother  lung  . Stroke Brother   . Cancer Brother        prostate and brain  . Cancer Brother        throat  . Diabetes Paternal Aunt   . Breast cancer Paternal Aunt   . Healthy Daughter   . Cirrhosis Brother      Social History   Socioeconomic History  . Marital status: Married    Spouse name: Not on file  . Number of children: 1  . Years of education: Not on file  . Highest education level: 12th grade  Occupational History  . Occupation: retired  Tobacco Use  . Smoking status: Former Smoker    Packs/day: 1.00    Years: 30.00    Pack years: 30.00    Types: Cigarettes    Quit date: 10/13/2005    Years since quitting:  13.7  . Smokeless tobacco: Former Neurosurgeon    Types: Snuff    Quit date: 10/09/2011  Substance and Sexual Activity  . Alcohol use: No    Alcohol/week: 0.0 standard drinks  . Drug use: No  . Sexual activity: Yes  Other Topics Concern  . Not on file  Social History Narrative   Lives with husband in a one story home.  Has one daughter.     Retired from [employer] work.     Education: high school.   Social Determinants of Health   Financial Resource Strain: Low Risk   . Difficulty of Paying Living Expenses: Not very hard  Food Insecurity: No Food Insecurity  . Worried About Programme researcher, broadcasting/film/video in the Last Year: Never true  . Ran Out of Food in the Last Year: Never true  Transportation Needs: No Transportation Needs  . Lack of Transportation (Medical): No  . Lack of Transportation (Non-Medical): No  Physical Activity: Inactive  . Days of Exercise per Week: 0 days  . Minutes of Exercise per Session: 0 min  Stress: No Stress Concern Present  . Feeling of Stress : Only a little  Social Connections: Slightly Isolated  . Frequency of Communication with Friends and Family: More than three times a week  . Frequency of Social Gatherings with Friends and Family: Three times a week  . Attends Religious Services: More than 4 times per year  . Active Member of Clubs or Organizations: No  . Attends Banker Meetings: Never  . Marital Status: Married  Catering manager Violence: Not At Risk  . Fear of Current or Ex-Partner: No  . Emotionally Abused: No  . Physically Abused: No  . Sexually Abused: No    Chart Review Today: I personally reviewed active problem list, medication list, allergies, family history, social history, health maintenance, notes from last encounter, lab results, imaging with the patient/caregiver today.   Review of Systems 10 Systems reviewed and are negative for acute change except as noted in the HPI.     Objective:   Vitals:   07/10/19 0804  BP: 126/78    Pulse: 81  Resp: 14  Temp: 97.8 F (36.6 C)  SpO2: 92%  Weight: 213 lb 1.6 oz (96.7 kg)  Height: 5\' 7"  (1.702 m)    Body mass index is 33.38 kg/m.  Physical Exam Vitals and nursing note reviewed.  Constitutional:      Appearance: She is well-developed.  HENT:     Head: Normocephalic and atraumatic.     Right Ear: External ear normal. Tenderness present. There is impacted cerumen. No mastoid tenderness.  Left Ear: External ear normal. No tenderness. There is impacted cerumen. No mastoid tenderness.     Nose: Mucosal edema, congestion and rhinorrhea present.     Right Sinus: No maxillary sinus tenderness or frontal sinus tenderness.     Left Sinus: No maxillary sinus tenderness or frontal sinus tenderness.     Mouth/Throat:     Mouth: Mucous membranes are moist.     Pharynx: Oropharynx is clear. Uvula midline. No posterior oropharyngeal erythema.  Eyes:     General:        Right eye: No discharge.        Left eye: No discharge.     Conjunctiva/sclera: Conjunctivae normal.  Neck:     Trachea: No tracheal deviation.  Cardiovascular:     Rate and Rhythm: Normal rate and regular rhythm.  Pulmonary:     Effort: Pulmonary effort is normal. No respiratory distress.     Breath sounds: No stridor.  Musculoskeletal:        General: Normal range of motion.  Skin:    General: Skin is warm and dry.     Findings: No rash.  Neurological:     Mental Status: She is alert.     Motor: No abnormal muscle tone.     Coordination: Coordination normal.  Psychiatric:        Behavior: Behavior normal.      Indication: Cerumen impaction of the ear(s)  Medical necessity statement: On physical examination, cerumen impairs clinically significant portions of bilateral external auditory canal, and tympanic membrane. Noted obstructive, copious cerumen that cannot be removed without magnification and instrumentations requiring MD/APP skills/procedure  Consent: Discussed benefits and risks of  procedure and verbal consent obtained  Procedure:  Cerumen Disimpaction  Patient was prepped for the procedure.  Utilized an otoscope to assess and take note of the ear canal, the tympanic membrane, and the presence, amount, and placement of the cerumen.  Gentle ear lavage with warm water and hydrogen peroxide performed on bilateral ear.   Some cerumen was removed with lavage, majority of cerumen removed b/l with soft plastic curette utilization with direct visualization to remove cerumen by myself.  Post procedure examination: shows cerumen was completely removed. Patient tolerated procedure well.  There were no complications and following the disimpaction the tympanic membrane was  Visible bilaterally, TM intact, right TM dull and mildly injected, left TM normal Auditory canals- inflamed on right, normal appearing on left Pt tolerated procedure, reported improvement of symptoms after removal of cerumen.   The patient is made aware that they may experience temporary vertigo, temporary hearing loss, and temporary discomfort.  If these symptom last for more than 24 hours to follow up in clinic.       Assessment & Plan:      ICD-10-CM   1. Impacted cerumen of both ears  H61.23 Ear Lavage    CANCELED: Ear Lavage  2. Rhinitis, unspecified type  J31.0 fluticasone (FLONASE) 50 MCG/ACT nasal spray    Cetirizine HCl (ZYRTEC ALLERGY) 10 MG TBDP  3. Otitis externa of right ear, unspecified chronicity, unspecified type  H60.91 neomycin-polymyxin-hydrocortisone (CORTISPORIN) OTIC solution    Bilateral cerumen impaction patient with rhinosinusitis likely allergic and chronic with some possible eustachian tube dysfunction to right ear. Cerumen bilaterally was successfully removed with both lavage and with instrumentation with direct visualization.  Patient tolerated without any symptoms or noted pain.  Right external auditory canal was mildly erythematous and edematous with right tympanic membrane  dull and slightly  injected, left TM and EAC normal.  Patient was given Cortisporin drops to use if she experienced right ear pain or tenderness to palpation.  More then 30+ min spent with pt today for OV and procedure   Danelle Berry, PA-C 07/22/19 1:25 PM

## 2019-07-30 ENCOUNTER — Telehealth (INDEPENDENT_AMBULATORY_CARE_PROVIDER_SITE_OTHER): Payer: Self-pay | Admitting: Gastroenterology

## 2019-07-30 ENCOUNTER — Other Ambulatory Visit (INDEPENDENT_AMBULATORY_CARE_PROVIDER_SITE_OTHER): Payer: Self-pay

## 2019-07-30 DIAGNOSIS — Z1211 Encounter for screening for malignant neoplasm of colon: Secondary | ICD-10-CM

## 2019-07-30 NOTE — Progress Notes (Signed)
Gastroenterology Pre-Procedure Review  Request Date: Friday 08/29/19 Requesting Physician: Dr. Servando Snare  PATIENT REVIEW QUESTIONS: The patient responded to the following health history questions as indicated:    1. Are you having any GI issues? no 2. Do you have a personal history of Polyps? no 3. Do you have a family history of Colon Cancer or Polyps? yes (mother had colon cancer at the 56 lived to be 96) 4. Diabetes Mellitus? no 5. Joint replacements in the past 12 months?no 6. Major health problems in the past 3 months?no 7. Any artificial heart valves, MVP, or defibrillator?no    MEDICATIONS & ALLERGIES:    Patient reports the following regarding taking any anticoagulation/antiplatelet therapy:   Plavix, Coumadin, Eliquis, Xarelto, Lovenox, Pradaxa, Brilinta, or Effient? no Aspirin? yes (81 mg daily)  Patient confirms/reports the following medications:  Current Outpatient Medications  Medication Sig Dispense Refill  . aspirin EC 81 MG tablet Take 1 tablet (81 mg total) by mouth daily. 30 tablet 11  . Cetirizine HCl (ZYRTEC ALLERGY) 10 MG TBDP Take 10 mg by mouth at bedtime. 30 tablet 0  . Cholecalciferol (VITAMIN D) 2000 units CAPS Take by mouth daily.    . DULoxetine (CYMBALTA) 30 MG capsule Take 1 capsule by mouth once daily 90 capsule 0  . ezetimibe (ZETIA) 10 MG tablet Take 1 tablet (10 mg total) by mouth daily. 90 tablet 3  . fluticasone (FLONASE) 50 MCG/ACT nasal spray Place 2 sprays into both nostrils daily. 16 g 6  . hydrochlorothiazide (HYDRODIURIL) 12.5 MG tablet Take 1 tablet (12.5 mg total) by mouth daily. 90 tablet 3  . loratadine (CLARITIN) 10 MG tablet Take 1 tablet (10 mg total) by mouth daily as needed. 90 tablet 1   No current facility-administered medications for this visit.    Patient confirms/reports the following allergies:  Allergies  Allergen Reactions  . Atorvastatin Itching  . Codeine Sulfate Itching  . Pravastatin Nausea Only  . Protonix  [Pantoprazole Sodium] Other (See Comments)    Legs felt heavy  . Tylenol With Codeine #3  [Acetaminophen-Codeine] Itching  . Penicillin G Rash    No orders of the defined types were placed in this encounter.   AUTHORIZATION INFORMATION Primary Insurance: 1D#: Group #:  Secondary Insurance: 1D#: Group #:  SCHEDULE INFORMATION: Date: Friday 08/29/19 Time: Location:[facility]

## 2019-08-05 ENCOUNTER — Other Ambulatory Visit: Payer: Self-pay | Admitting: Family Medicine

## 2019-08-21 ENCOUNTER — Encounter: Payer: Self-pay | Admitting: Gastroenterology

## 2019-08-21 ENCOUNTER — Other Ambulatory Visit: Payer: Self-pay

## 2019-08-26 ENCOUNTER — Telehealth: Payer: Self-pay

## 2019-08-26 NOTE — Telephone Encounter (Signed)
Patient has been notified that the low dose lung cancer screening CT scan is due currently or will be in near future.  Confirmed that patient is within the appropriate age range and asymptomatic, (no signs or symptoms of lung cancer).  Patient denies illness that would prevent curative treatment for lung cancer if found.  Patient is agreeable for CT scan being scheduled.    Verified smoking history (former smoker, quit 2013 with 30 year history).   Patient agrees to schedule CT scan but would like to wait till the 2nd week in July around 10 am.

## 2019-08-27 ENCOUNTER — Other Ambulatory Visit
Admission: RE | Admit: 2019-08-27 | Discharge: 2019-08-27 | Disposition: A | Discharge status: Home / Self Care | Payer: Medicare Other | Source: Ambulatory Visit | Attending: Gastroenterology | Admitting: Gastroenterology

## 2019-08-27 ENCOUNTER — Other Ambulatory Visit: Payer: Self-pay

## 2019-08-27 DIAGNOSIS — Z20822 Contact with and (suspected) exposure to covid-19: Secondary | ICD-10-CM | POA: Insufficient documentation

## 2019-08-27 DIAGNOSIS — Z01812 Encounter for preprocedural laboratory examination: Secondary | ICD-10-CM | POA: Diagnosis present

## 2019-08-28 LAB — SARS CORONAVIRUS 2 (TAT 6-24 HRS): SARS Coronavirus 2: NEGATIVE

## 2019-08-28 NOTE — Discharge Instructions (Signed)
General Anesthesia, Adult, Care After This sheet gives you information about how to care for yourself after your procedure. Your health care provider may also give you more specific instructions. If you have problems or questions, contact your health care provider. What can I expect after the procedure? After the procedure, the following side effects are common:  Pain or discomfort at the IV site.  Nausea.  Vomiting.  Sore throat.  Trouble concentrating.  Feeling cold or chills.  Weak or tired.  Sleepiness and fatigue.  Soreness and body aches. These side effects can affect parts of the body that were not involved in surgery. Follow these instructions at home:  For at least 24 hours after the procedure:  Have a responsible adult stay with you. It is important to have someone help care for you until you are awake and alert.  Rest as needed.  Do not: ? Participate in activities in which you could fall or become injured. ? Drive. ? Use heavy machinery. ? Drink alcohol. ? Take sleeping pills or medicines that cause drowsiness. ? Make important decisions or sign legal documents. ? Take care of children on your own. Eating and drinking  Follow any instructions from your health care provider about eating or drinking restrictions.  When you feel hungry, start by eating small amounts of foods that are soft and easy to digest (bland), such as toast. Gradually return to your regular diet.  Drink enough fluid to keep your urine pale yellow.  If you vomit, rehydrate by drinking water, juice, or clear broth. General instructions  If you have sleep apnea, surgery and certain medicines can increase your risk for breathing problems. Follow instructions from your health care provider about wearing your sleep device: ? Anytime you are sleeping, including during daytime naps. ? While taking prescription pain medicines, sleeping medicines, or medicines that make you drowsy.  Return to  your normal activities as told by your health care provider. Ask your health care provider what activities are safe for you.  Take over-the-counter and prescription medicines only as told by your health care provider.  If you smoke, do not smoke without supervision.  Keep all follow-up visits as told by your health care provider. This is important. Contact a health care provider if:  You have nausea or vomiting that does not get better with medicine.  You cannot eat or drink without vomiting.  You have pain that does not get better with medicine.  You are unable to pass urine.  You develop a skin rash.  You have a fever.  You have redness around your IV site that gets worse. Get help right away if:  You have difficulty breathing.  You have chest pain.  You have blood in your urine or stool, or you vomit blood. Summary  After the procedure, it is common to have a sore throat or nausea. It is also common to feel tired.  Have a responsible adult stay with you for the first 24 hours after general anesthesia. It is important to have someone help care for you until you are awake and alert.  When you feel hungry, start by eating small amounts of foods that are soft and easy to digest (bland), such as toast. Gradually return to your regular diet.  Drink enough fluid to keep your urine pale yellow.  Return to your normal activities as told by your health care provider. Ask your health care provider what activities are safe for you. This information is not   intended to replace advice given to you by your health care provider. Make sure you discuss any questions you have with your health care provider. Document Revised: 03/30/2017 Document Reviewed: 11/10/2016 Elsevier Patient Education  2020 Elsevier Inc.  

## 2019-08-29 ENCOUNTER — Ambulatory Visit: Payer: Medicare Other | Admitting: Anesthesiology

## 2019-08-29 ENCOUNTER — Encounter: Payer: Self-pay | Admitting: Gastroenterology

## 2019-08-29 ENCOUNTER — Other Ambulatory Visit: Payer: Self-pay

## 2019-08-29 ENCOUNTER — Ambulatory Visit
Admission: RE | Admit: 2019-08-29 | Discharge: 2019-08-29 | Disposition: A | Discharge status: Home / Self Care | Payer: Medicare Other | Source: Ambulatory Visit | Attending: Gastroenterology | Admitting: Gastroenterology

## 2019-08-29 ENCOUNTER — Encounter
Admission: RE | Disposition: A | Discharge status: Home / Self Care | Payer: Self-pay | Source: Ambulatory Visit | Attending: Gastroenterology

## 2019-08-29 DIAGNOSIS — I739 Peripheral vascular disease, unspecified: Secondary | ICD-10-CM | POA: Diagnosis not present

## 2019-08-29 DIAGNOSIS — Z9842 Cataract extraction status, left eye: Secondary | ICD-10-CM | POA: Insufficient documentation

## 2019-08-29 DIAGNOSIS — N182 Chronic kidney disease, stage 2 (mild): Secondary | ICD-10-CM | POA: Insufficient documentation

## 2019-08-29 DIAGNOSIS — Z833 Family history of diabetes mellitus: Secondary | ICD-10-CM | POA: Insufficient documentation

## 2019-08-29 DIAGNOSIS — I2510 Atherosclerotic heart disease of native coronary artery without angina pectoris: Secondary | ICD-10-CM | POA: Diagnosis not present

## 2019-08-29 DIAGNOSIS — Z8042 Family history of malignant neoplasm of prostate: Secondary | ICD-10-CM | POA: Insufficient documentation

## 2019-08-29 DIAGNOSIS — Z880 Allergy status to penicillin: Secondary | ICD-10-CM | POA: Insufficient documentation

## 2019-08-29 DIAGNOSIS — Z9841 Cataract extraction status, right eye: Secondary | ICD-10-CM | POA: Diagnosis not present

## 2019-08-29 DIAGNOSIS — Z6832 Body mass index (BMI) 32.0-32.9, adult: Secondary | ICD-10-CM | POA: Insufficient documentation

## 2019-08-29 DIAGNOSIS — I878 Other specified disorders of veins: Secondary | ICD-10-CM | POA: Diagnosis not present

## 2019-08-29 DIAGNOSIS — Z87891 Personal history of nicotine dependence: Secondary | ICD-10-CM | POA: Diagnosis not present

## 2019-08-29 DIAGNOSIS — M48061 Spinal stenosis, lumbar region without neurogenic claudication: Secondary | ICD-10-CM | POA: Insufficient documentation

## 2019-08-29 DIAGNOSIS — K5730 Diverticulosis of large intestine without perforation or abscess without bleeding: Secondary | ICD-10-CM | POA: Insufficient documentation

## 2019-08-29 DIAGNOSIS — Z8249 Family history of ischemic heart disease and other diseases of the circulatory system: Secondary | ICD-10-CM | POA: Insufficient documentation

## 2019-08-29 DIAGNOSIS — Z860100 Personal history of colon polyps, unspecified: Secondary | ICD-10-CM

## 2019-08-29 DIAGNOSIS — J449 Chronic obstructive pulmonary disease, unspecified: Secondary | ICD-10-CM | POA: Insufficient documentation

## 2019-08-29 DIAGNOSIS — Z888 Allergy status to other drugs, medicaments and biological substances status: Secondary | ICD-10-CM | POA: Insufficient documentation

## 2019-08-29 DIAGNOSIS — M1990 Unspecified osteoarthritis, unspecified site: Secondary | ICD-10-CM | POA: Diagnosis not present

## 2019-08-29 DIAGNOSIS — G629 Polyneuropathy, unspecified: Secondary | ICD-10-CM | POA: Diagnosis not present

## 2019-08-29 DIAGNOSIS — K219 Gastro-esophageal reflux disease without esophagitis: Secondary | ICD-10-CM | POA: Insufficient documentation

## 2019-08-29 DIAGNOSIS — Z90710 Acquired absence of both cervix and uterus: Secondary | ICD-10-CM | POA: Diagnosis not present

## 2019-08-29 DIAGNOSIS — Z1211 Encounter for screening for malignant neoplasm of colon: Secondary | ICD-10-CM | POA: Diagnosis not present

## 2019-08-29 DIAGNOSIS — I129 Hypertensive chronic kidney disease with stage 1 through stage 4 chronic kidney disease, or unspecified chronic kidney disease: Secondary | ICD-10-CM | POA: Diagnosis not present

## 2019-08-29 DIAGNOSIS — E559 Vitamin D deficiency, unspecified: Secondary | ICD-10-CM | POA: Diagnosis not present

## 2019-08-29 DIAGNOSIS — M5430 Sciatica, unspecified side: Secondary | ICD-10-CM | POA: Insufficient documentation

## 2019-08-29 DIAGNOSIS — M8580 Other specified disorders of bone density and structure, unspecified site: Secondary | ICD-10-CM | POA: Insufficient documentation

## 2019-08-29 DIAGNOSIS — Z800 Family history of malignant neoplasm of digestive organs: Secondary | ICD-10-CM | POA: Insufficient documentation

## 2019-08-29 DIAGNOSIS — Z8379 Family history of other diseases of the digestive system: Secondary | ICD-10-CM | POA: Insufficient documentation

## 2019-08-29 DIAGNOSIS — F419 Anxiety disorder, unspecified: Secondary | ICD-10-CM | POA: Insufficient documentation

## 2019-08-29 DIAGNOSIS — E785 Hyperlipidemia, unspecified: Secondary | ICD-10-CM | POA: Diagnosis not present

## 2019-08-29 DIAGNOSIS — Z823 Family history of stroke: Secondary | ICD-10-CM | POA: Insufficient documentation

## 2019-08-29 DIAGNOSIS — Z79899 Other long term (current) drug therapy: Secondary | ICD-10-CM | POA: Insufficient documentation

## 2019-08-29 DIAGNOSIS — Z885 Allergy status to narcotic agent status: Secondary | ICD-10-CM | POA: Insufficient documentation

## 2019-08-29 DIAGNOSIS — Z803 Family history of malignant neoplasm of breast: Secondary | ICD-10-CM | POA: Insufficient documentation

## 2019-08-29 DIAGNOSIS — Z7982 Long term (current) use of aspirin: Secondary | ICD-10-CM | POA: Insufficient documentation

## 2019-08-29 DIAGNOSIS — Z801 Family history of malignant neoplasm of trachea, bronchus and lung: Secondary | ICD-10-CM | POA: Insufficient documentation

## 2019-08-29 HISTORY — PX: COLONOSCOPY WITH PROPOFOL: SHX5780

## 2019-08-29 HISTORY — DX: Presence of dental prosthetic device (complete) (partial): Z97.2

## 2019-08-29 SURGERY — COLONOSCOPY WITH PROPOFOL
Anesthesia: General | Site: Rectum

## 2019-08-29 MED ORDER — PROPOFOL 10 MG/ML IV BOLUS
INTRAVENOUS | Status: DC | PRN
  Administered 2019-08-29 (×3): 20 mg via INTRAVENOUS
  Administered 2019-08-29: 100 mg via INTRAVENOUS
  Administered 2019-08-29: 20 mg via INTRAVENOUS

## 2019-08-29 MED ORDER — STERILE WATER FOR IRRIGATION IR SOLN
Status: DC | PRN
  Administered 2019-08-29: 50 mL

## 2019-08-29 MED ORDER — LIDOCAINE HCL (CARDIAC) PF 100 MG/5ML IV SOSY
PREFILLED_SYRINGE | INTRAVENOUS | Status: DC | PRN
  Administered 2019-08-29: 40 mg via INTRAVENOUS

## 2019-08-29 MED ORDER — LACTATED RINGERS IV SOLN: INTRAVENOUS | Status: DC

## 2019-08-29 SURGICAL SUPPLY — 5 items
GOWN CVR UNV OPN BCK APRN NK (MISCELLANEOUS) ×2
GOWN ISOL THUMB LOOP REG UNIV (MISCELLANEOUS) ×2
KIT ENDO PROCEDURE OLY (KITS) ×2
MANIFOLD NEPTUNE II (INSTRUMENTS) ×2
WATER STERILE IRR 250ML POUR (IV SOLUTION) ×2

## 2019-08-29 NOTE — Op Note (Signed)
[facility] Gastroenterology Patient Name: Dana Peters Procedure Date: 08/29/2019 9:54 AM MRN: [id number] Account #: [id number] Date of Birth: 1945-06-18 Admit Type: Outpatient Age: 74 Room: [facility] OR ROOM 01 Gender: Female Note Status: Finalized Procedure:             Colonoscopy Indications:           High risk colon cancer surveillance: Personal history                         of colonic polyps Providers:             Midge Minium MD, MD Referring MD:          Danelle Berry (Referring MD) Medicines:             Propofol per Anesthesia Complications:         No immediate complications. Procedure:             Pre-Anesthesia Assessment:                        - Prior to the procedure, a History and Physical was                         performed, and patient medications and allergies were                         reviewed. The patient's tolerance of previous                         anesthesia was also reviewed. The risks and benefits                         of the procedure and the sedation options and risks                         were discussed with the patient. All questions were                         answered, and informed consent was obtained. Prior                         Anticoagulants: The patient has taken no previous                         anticoagulant or antiplatelet agents. ASA Grade                         Assessment: II - A patient with mild systemic disease.                         After reviewing the risks and benefits, the patient                         was deemed in satisfactory condition to undergo the                         procedure.  After obtaining informed consent, the colonoscope was                         passed under direct vision. Throughout the procedure,                         the patient's blood pressure, pulse, and oxygen                         saturations were monitored continuously. The           Colonoscope was introduced through the anus and                         advanced to the the cecum, identified by appendiceal                         orifice and ileocecal valve. The colonoscopy was                         performed without difficulty. The patient tolerated                         the procedure well. The quality of the bowel                         preparation was good. Findings:      The perianal and digital rectal examinations were normal.      Multiple small-mouthed diverticula were found in the sigmoid colon. Impression:            - Diverticulosis in the sigmoid colon.                        - No specimens collected. Recommendation:        - Discharge patient to home.                        - Resume previous diet.                        - Continue present medications. Procedure Code(s):     --- Professional ---                        [phone], Colonoscopy, flexible; diagnostic, including                         collection of specimen(s) by brushing or washing, when                         performed (separate procedure) Diagnosis Code(s):     --- Professional ---                        Z86.010, Personal history of colonic polyps CPT copyright 2019 American Medical Association. All rights reserved. The codes documented in this report are preliminary and upon coder review may  be revised to meet current compliance requirements. [name] MD, MD 08/29/2019 10:17:45 AM This report has been signed electronically. Number of Addenda: 0 Note Initiated On: 08/29/2019 9:54 AM Scope Withdrawal Time: 0 hours 7  minutes 56 seconds  Total Procedure Duration: 0 hours 11 minutes 57 seconds  Estimated Blood Loss:  Estimated blood loss: none.      [facility]

## 2019-08-29 NOTE — Anesthesia Preprocedure Evaluation (Signed)
Anesthesia Evaluation  Patient identified by MRN, date of birth, ID band Patient awake    Reviewed: NPO status   History of Anesthesia Complications Negative for: history of anesthetic complications  Airway Mallampati: II  TM Distance: >3 FB Neck ROM: full    Dental no notable dental hx. (+) Lower Dentures, Upper Dentures   Pulmonary COPD (mild), former smoker,    Pulmonary exam normal        Cardiovascular Exercise Tolerance: Good hypertension, + CAD ( Coronary artery calcification seen on CT scan) and + Peripheral Vascular Disease  Normal cardiovascular exam     Neuro/Psych Anxiety Neuropathy     GI/Hepatic Neg liver ROS, GERD  Controlled,  Endo/Other  Morbid obesity (bmi 32)  Renal/GU CRFRenal disease (ckd2)  negative genitourinary   Musculoskeletal  (+) Arthritis ,   Abdominal   Peds  Hematology negative hematology ROS (+)   Anesthesia Other Findings Covid: NEG.  pcp note: Doren Custard, FNP at 02/21/2019;    Reproductive/Obstetrics                             Anesthesia Physical Anesthesia Plan  ASA: III  Anesthesia Plan: General   Post-op Pain Management:    Induction:   PONV Risk Score and Plan: 3 and TIVA, Propofol infusion and Treatment may vary due to age or medical condition  Airway Management Planned:   Additional Equipment:   Intra-op Plan:   Post-operative Plan:   Informed Consent: I have reviewed the patients History and Physical, chart, labs and discussed the procedure including the risks, benefits and alternatives for the proposed anesthesia with the patient or authorized representative who has indicated his/her understanding and acceptance.       Plan Discussed with: CRNA  Anesthesia Plan Comments:         Anesthesia Quick Evaluation

## 2019-08-29 NOTE — Transfer of Care (Signed)
Immediate Anesthesia Transfer of Care Note  Patient: Dana Peters  Procedure(s) Performed: COLONOSCOPY WITH PROPOFOL (N/A Rectum)  Patient Location: PACU  Anesthesia Type: General  Level of Consciousness: awake, alert  and patient cooperative  Airway and Oxygen Therapy: Patient Spontanous Breathing and Patient connected to supplemental oxygen  Post-op Assessment: Post-op Vital signs reviewed, Patient's Cardiovascular Status Stable, Respiratory Function Stable, Patent Airway and No signs of Nausea or vomiting  Post-op Vital Signs: Reviewed and stable  Complications: No apparent anesthesia complications

## 2019-08-29 NOTE — H&P (Signed)
Dana Lame, MD [address] [place], [place] [address] Phone:[phone] Fax : [phone]  Primary Care Physician:  Delsa Grana, PA-C Primary Gastroenterologist:  Dr. Allen Norris  Pre-Procedure History & Physical: HPI:  Dana Peters is a 74 y.o. female is here for an colonoscopy.   Past Medical History:  Diagnosis Date  . Allergy   . Baker's cyst of knee, right 06/04/2017   [name] Feb 2019  . CKD (chronic kidney disease) stage 2, GFR 60-89 ml/min   . Dyslipidemia   . GERD (gastroesophageal reflux disease)   . Hypertension   . Lumbar spinal stenosis   . Neuralgia neuritis, sciatic nerve 08/28/2012  . Neuropathy    both feet  . Obesity (BMI 30.0-34.9) 05/29/2016  . Osteopenia 06/15/2016   March 2018  . Personal history of tobacco use, presenting hazards to health 07/14/2015  . Reflux   . Sciatica   . Thoracic aortic atherosclerosis ([place]) 08/01/2016   Chest CT  . Venous stasis   . Vitamin D deficiency disease   . Wears dentures    Full upper, partial lower    Past Surgical History:  Procedure Laterality Date  . ABDOMINAL HYSTERECTOMY     complete, due to heavy  . CATARACT EXTRACTION, BILATERAL    . ECTOPIC PREGNANCY SURGERY      Prior to Admission medications   Medication Sig Start Date End Date Taking? Authorizing Provider  aspirin EC 81 MG tablet Take 1 tablet (81 mg total) by mouth daily. 05/24/15  Yes Lada, Satira Anis, MD  Cetirizine HCl (ZYRTEC ALLERGY) 10 MG TBDP Take 10 mg by mouth at bedtime. 07/10/19  Yes Delsa Grana, PA-C  Cholecalciferol (VITAMIN D) 2000 units CAPS Take by mouth daily.   Yes [provider]  DULoxetine (CYMBALTA) 30 MG capsule Take 1 capsule by mouth once daily 08/05/19  Yes Hubbard Hartshorn, FNP  ezetimibe (ZETIA) 10 MG tablet Take 1 tablet (10 mg total) by mouth daily. 12/25/18  Yes Hubbard Hartshorn, FNP  fluticasone (FLONASE) 50 MCG/ACT nasal spray Place 2 sprays into both nostrils daily. 07/10/19  Yes Delsa Grana, PA-C    hydrochlorothiazide (HYDRODIURIL) 12.5 MG tablet Take 1 tablet (12.5 mg total) by mouth daily. 10/18/18  Yes Hubbard Hartshorn, FNP  loratadine (CLARITIN) 10 MG tablet Take 1 tablet (10 mg total) by mouth daily as needed. 07/02/18  Yes Fredderick Severance, NP    Allergies as of 07/30/2019 - Review Complete 07/30/2019  Allergen Reaction Noted  . Atorvastatin Itching 11/09/2015  . Codeine sulfate Itching 09/14/2014  . Pravastatin Nausea Only 11/17/2015  . Protonix [pantoprazole sodium] Other (See Comments) 09/14/2014  . Tylenol with codeine #3  [acetaminophen-codeine] Itching 01/07/2015  . Penicillin g Rash 01/07/2015    Family History  Problem Relation Age of Onset  . Cancer Mother        colon  . Hypertension Mother   . Alzheimer's disease Mother   . Cancer Father        prostate  . Cancer Brother        lung  . Stroke Brother   . Cancer Brother        prostate and brain  . Cancer Brother        throat  . Diabetes Paternal Aunt   . Breast cancer Paternal Aunt   . Healthy Daughter   . Cirrhosis Brother     Social History   Socioeconomic History  . Marital status: Married  Spouse name: Not on file  . Number of children: 1  . Years of education: Not on file  . Highest education level: 12th grade  Occupational History  . Occupation: retired  Tobacco Use  . Smoking status: Former Smoker    Packs/day: 1.00    Years: 30.00    Pack years: 30.00    Types: Cigarettes    Quit date: 10/13/2005    Years since quitting: 13.8  . Smokeless tobacco: Former Neurosurgeon    Types: Snuff    Quit date: 10/09/2011  Substance and Sexual Activity  . Alcohol use: No    Alcohol/week: 0.0 standard drinks  . Drug use: No  . Sexual activity: Yes  Other Topics Concern  . Not on file  Social History Narrative   Lives with husband in a one story home.  Has one daughter.     Retired from [employer] work.     Education: high school.   Social Determinants of Health   Financial Resource Strain:  Low Risk   . Difficulty of Paying Living Expenses: Not very hard  Food Insecurity: No Food Insecurity  . Worried About Programme researcher, broadcasting/film/video in the Last Year: Never true  . Ran Out of Food in the Last Year: Never true  Transportation Needs: No Transportation Needs  . Lack of Transportation (Medical): No  . Lack of Transportation (Non-Medical): No  Physical Activity: Inactive  . Days of Exercise per Week: 0 days  . Minutes of Exercise per Session: 0 min  Stress: No Stress Concern Present  . Feeling of Stress : Only a little  Social Connections: Slightly Isolated  . Frequency of Communication with Friends and Family: More than three times a week  . Frequency of Social Gatherings with Friends and Family: Three times a week  . Attends Religious Services: More than 4 times per year  . Active Member of Clubs or Organizations: No  . Attends Banker Meetings: Never  . Marital Status: Married  Catering manager Violence: Not At Risk  . Fear of Current or Ex-Partner: No  . Emotionally Abused: No  . Physically Abused: No  . Sexually Abused: No    Review of Systems: See HPI, otherwise negative ROS  Physical Exam: BP (!) 141/79   Pulse 65   Temp (!) 97.5 F (36.4 C) (Temporal)   Resp 16   Ht 5\' 7"  (1.702 m)   Wt 93.2 kg   LMP  (LMP Unknown)   SpO2 97%   BMI 32.19 kg/m  General:   Alert,  pleasant and cooperative in NAD Head:  Normocephalic and atraumatic. Neck:  Supple; no masses or thyromegaly. Lungs:  Clear throughout to auscultation.    Heart:  Regular rate and rhythm. Abdomen:  Soft, nontender and nondistended. Normal bowel sounds, without guarding, and without rebound.   Neurologic:  Alert and  oriented x4;  grossly normal neurologically.  Impression/Plan: [name] is here for an colonoscopy to be performed for history of adenomatous polyps with last colonoscopy 07/2014  Risks, benefits, limitations, and alternatives regarding  colonoscopy have been  reviewed with the patient.  Questions have been answered.  All parties agreeable.   08/2014, MD  08/29/2019, 9:12 AM

## 2019-08-29 NOTE — Anesthesia Postprocedure Evaluation (Signed)
Anesthesia Post Note  Patient: Dana Peters  Procedure(s) Performed: COLONOSCOPY WITH PROPOFOL (N/A Rectum)     Patient location during evaluation: PACU Anesthesia Type: General Level of consciousness: awake and alert Pain management: pain level controlled Vital Signs Assessment: post-procedure vital signs reviewed and stable Respiratory status: spontaneous breathing, nonlabored ventilation, respiratory function stable and patient connected to nasal cannula oxygen Cardiovascular status: blood pressure returned to baseline and stable Postop Assessment: no apparent nausea or vomiting Anesthetic complications: no    Tieisha Darden

## 2019-09-01 ENCOUNTER — Encounter: Payer: Self-pay | Admitting: *Deleted

## 2019-09-05 ENCOUNTER — Ambulatory Visit: Payer: Medicare HMO | Admitting: Family Medicine

## 2019-09-10 LAB — HEMOGLOBIN A1C: Hemoglobin A1C: 5.6

## 2019-09-11 ENCOUNTER — Ambulatory Visit
Admission: EM | Admit: 2019-09-11 | Discharge: 2019-09-11 | Disposition: A | Discharge status: Home / Self Care | Payer: Medicare Other | Attending: Family Medicine | Admitting: Family Medicine

## 2019-09-11 ENCOUNTER — Encounter: Payer: Self-pay | Admitting: Emergency Medicine

## 2019-09-11 ENCOUNTER — Other Ambulatory Visit: Payer: Self-pay

## 2019-09-11 DIAGNOSIS — M7918 Myalgia, other site: Secondary | ICD-10-CM | POA: Diagnosis not present

## 2019-09-11 MED ORDER — TRAMADOL HCL 50 MG PO TABS: 50.0000 mg | ORAL_TABLET | Freq: Three times a day (TID) | ORAL | 0 refills | Status: DC | PRN

## 2019-09-11 NOTE — ED Provider Notes (Signed)
[facility]    CSN: [id number] Arrival date & time: 09/11/19  1231      History   Chief Complaint Chief Complaint  Patient presents with  . Hip Pain   HPI  74 year old female presents with the above complaint.  Patient reports that her pain started on Sunday.  She reports left hip pain.  She localizes the pain to the left buttock.  She has a history of of lumbar issues.  No recent trauma, fall, injury.  She does state that she has recently lifted a child up who was quite heavy.  She is unsure if this is contributing.  No radicular symptoms.  She has taken Tylenol with mild improvement.  Rates her pain as 5/10 in severity.  No other associated symptoms.  No other complaints.  Past Medical History:  Diagnosis Date  . Allergy   . Baker's cyst of knee, right 06/04/2017   [name] Feb 2019  . CKD (chronic kidney disease) stage 2, GFR 60-89 ml/min   . Dyslipidemia   . GERD (gastroesophageal reflux disease)   . Hypertension   . Lumbar spinal stenosis   . Neuralgia neuritis, sciatic nerve 08/28/2012  . Neuropathy    both feet  . Obesity (BMI 30.0-34.9) 05/29/2016  . Osteopenia 06/15/2016   March 2018  . Personal history of tobacco use, presenting hazards to health 07/14/2015  . Reflux   . Sciatica   . Thoracic aortic atherosclerosis (HCC) 08/01/2016   Chest CT  . Venous stasis   . Vitamin D deficiency disease   . Wears dentures    Full upper, partial lower    Patient Active Problem List   Diagnosis Date Noted  . Personal history of colonic polyps   . Bilateral lower extremity edema 09/27/2017  . Emphysema of lung (HCC) 08/14/2017  . Ganglion cyst of finger 06/25/2017  . Full code status 06/25/2017  . Osteoarthritis of knee 06/12/2017  . Baker's cyst of knee, right 06/04/2017  . Thoracic aortic atherosclerosis (HCC) 08/01/2016  . Osteopenia 06/15/2016  . Obesity (BMI 30.0-34.9) 05/29/2016  . Urinary hesitancy 02/14/2016  . PAD (peripheral artery disease) (HCC)  09/09/2015  . Coronary artery calcification seen on CT scan 07/20/2015  . Personal history of tobacco use, presenting hazards to health 07/14/2015  . Foraminal stenosis of lumbar region 01/28/2015  . Facet arthropathy, lumbar 01/28/2015  . Spinal stenosis of lumbar region 01/28/2015  . Neuropathy of both feet 01/07/2015  . Leg pain, bilateral 01/07/2015  . Leg heaviness 01/07/2015  . CKD (chronic kidney disease) stage 2, GFR 60-89 ml/min 10/17/2014  . Allergic rhinitis 10/14/2014  . Dyslipidemia 10/14/2014  . Hypertension   . Vitamin D deficiency disease   . GERD (gastroesophageal reflux disease) 02/02/2014    Past Surgical History:  Procedure Laterality Date  . ABDOMINAL HYSTERECTOMY     complete, due to heavy  . CATARACT EXTRACTION, BILATERAL    . COLONOSCOPY WITH PROPOFOL N/A 08/29/2019   Procedure: COLONOSCOPY WITH PROPOFOL;  Surgeon: Midge Minium, MD;  Location: [facility];  Service: Endoscopy;  Laterality: N/A;  priority 4  . ECTOPIC PREGNANCY SURGERY      OB History   No obstetric history on file.      Home Medications    Prior to Admission medications   Medication Sig Start Date End Date Taking? Authorizing Provider  aspirin EC 81 MG tablet Take 1 tablet (81 mg total) by mouth daily. 05/24/15  Yes Lada, Janit Bern, MD  Cetirizine HCl (ZYRTEC ALLERGY) 10 MG TBDP Take 10 mg by mouth at bedtime. 07/10/19  Yes Delsa Grana, PA-C  Cholecalciferol (VITAMIN D) 2000 units CAPS Take by mouth daily.   Yes [provider]  DULoxetine (CYMBALTA) 30 MG capsule Take 1 capsule by mouth once daily 08/05/19  Yes Hubbard Hartshorn, FNP  ezetimibe (ZETIA) 10 MG tablet Take 1 tablet (10 mg total) by mouth daily. 12/25/18  Yes Hubbard Hartshorn, FNP  fluticasone (FLONASE) 50 MCG/ACT nasal spray Place 2 sprays into both nostrils daily. 07/10/19  Yes Delsa Grana, PA-C  hydrochlorothiazide (HYDRODIURIL) 12.5 MG tablet Take 1 tablet (12.5 mg total) by mouth daily. 10/18/18  Yes Hubbard Hartshorn, FNP  loratadine (CLARITIN) 10 MG tablet Take 1 tablet (10 mg total) by mouth daily as needed. 07/02/18  Yes Poulose, Bethel Born, NP  traMADol (ULTRAM) 50 MG tablet Take 1 tablet (50 mg total) by mouth every 8 (eight) hours as needed. 09/11/19   Coral Spikes, DO    Family History Family History  Problem Relation Age of Onset  . Cancer Mother        colon  . Hypertension Mother   . Alzheimer's disease Mother   . Cancer Father        prostate  . Cancer Brother        lung  . Stroke Brother   . Cancer Brother        prostate and brain  . Cancer Brother        throat  . Diabetes Paternal Aunt   . Breast cancer Paternal Aunt   . Healthy Daughter   . Cirrhosis Brother     Social History Social History   Tobacco Use  . Smoking status: Former Smoker    Packs/day: 1.00    Years: 30.00    Pack years: 30.00    Types: Cigarettes    Quit date: 10/13/2005    Years since quitting: 13.9  . Smokeless tobacco: Former Systems developer    Types: Snuff    Quit date: 10/09/2011  Substance Use Topics  . Alcohol use: No    Alcohol/week: 0.0 standard drinks  . Drug use: No     Allergies   Atorvastatin, Codeine sulfate, Pravastatin, Protonix [pantoprazole sodium], Tylenol with codeine #3  [acetaminophen-codeine], and Penicillin g   Review of Systems Review of Systems  Musculoskeletal:       Left "hip" pain.   Physical Exam Triage Vital Signs ED Triage Vitals [09/11/19 1249]  Enc Vitals Group     BP (!) 141/89     Pulse Rate 71     Resp 18     Temp 98.3 F (36.8 C)     Temp Source Oral     SpO2 98 %     Weight 207 lb (93.9 kg)     Height 5\' 7"  (1.702 m)     Head Circumference      Peak Flow      Pain Score 5     Pain Loc      Pain Edu?      Excl. in [place]?    Updated Vital Signs BP (!) 141/89 (BP Location: Right Arm)   Pulse 71   Temp 98.3 F (36.8 C) (Oral)   Resp 18   Ht 5\' 7"  (1.702 m)   Wt 93.9 kg   LMP  (LMP Unknown)   SpO2 98%   BMI 32.42 kg/m   Visual Acuity  Right Eye  Distance:   Left Eye Distance:   Bilateral Distance:    Right Eye Near:   Left Eye Near:    Bilateral Near:     Physical Exam Vitals and nursing note reviewed.  Constitutional:      General: She is not in acute distress.    Appearance: Normal appearance. She is not ill-appearing.  HENT:     Head: Normocephalic and atraumatic.  Eyes:     General:        Right eye: No discharge.        Left eye: No discharge.     Conjunctiva/sclera: Conjunctivae normal.  Cardiovascular:     Rate and Rhythm: Normal rate and regular rhythm.  Pulmonary:     Effort: Pulmonary effort is normal.     Breath sounds: Normal breath sounds. No wheezing or rales.  Musculoskeletal:     Comments: Normal range of motion of the left hip.  No greater trochanter tenderness.  Patient reporting tenderness over the piriformis muscle.  Neurological:     Mental Status: She is alert.  Psychiatric:        Mood and Affect: Mood normal.        Behavior: Behavior normal.    UC Treatments / Results  Labs (all labs ordered are listed, but only abnormal results are displayed) Labs Reviewed - No data to display  EKG   Radiology No results found.  Procedures Procedures (including critical care time)  Medications Ordered in UC Medications - No data to display  Initial Impression / Assessment and Plan / UC Course  I have reviewed the triage vital signs and the nursing notes.  Pertinent labs & imaging results that were available during my care of the patient were reviewed by me and considered in my medical decision making (see chart for details).    74 year old female presents with performance syndrome versus pain from low back.  Tramadol as needed.  Rest.  Supportive care.  Final Clinical Impressions(s) / UC Diagnoses   Final diagnoses:  Left buttock pain     Discharge Instructions     Rest.  Medication as needed.  If persists, see Ortho. [facility] clinic Orthopedics [phone]) OR  [facility] [phone])  Take care  Dr. Adriana Simas     ED Prescriptions    Medication Sig Dispense Auth. Provider   traMADol (ULTRAM) 50 MG tablet Take 1 tablet (50 mg total) by mouth every 8 (eight) hours as needed. 10 tablet Everlene Other G, DO     I have reviewed the PDMP during this encounter.   Tommie Sams, [place] 09/11/19 1511

## 2019-09-11 NOTE — Discharge Instructions (Signed)
Rest.  Medication as needed.  If persists, see Ortho. [facility] clinic Orthopedics [phone]) OR [facility] [phone])  Take care  Dr. Adriana Simas

## 2019-09-11 NOTE — ED Triage Notes (Signed)
Patient c/o left hip pain that started on Sunday. Patient denies injury.

## 2019-09-12 ENCOUNTER — Ambulatory Visit: Payer: Medicare Other | Admitting: Family Medicine

## 2019-09-29 ENCOUNTER — Ambulatory Visit (INDEPENDENT_AMBULATORY_CARE_PROVIDER_SITE_OTHER): Payer: Medicare Other | Admitting: Family Medicine

## 2019-09-29 ENCOUNTER — Encounter: Payer: Self-pay | Admitting: Family Medicine

## 2019-09-29 ENCOUNTER — Other Ambulatory Visit: Payer: Self-pay

## 2019-09-29 VITALS — BP 124/86 | HR 83 | Temp 97.6°F | Resp 16 | Ht 67.0 in | Wt 207.5 lb

## 2019-09-29 DIAGNOSIS — K219 Gastro-esophageal reflux disease without esophagitis: Secondary | ICD-10-CM

## 2019-09-29 DIAGNOSIS — I10 Essential (primary) hypertension: Secondary | ICD-10-CM | POA: Diagnosis not present

## 2019-09-29 DIAGNOSIS — R600 Localized edema: Secondary | ICD-10-CM

## 2019-09-29 DIAGNOSIS — N182 Chronic kidney disease, stage 2 (mild): Secondary | ICD-10-CM | POA: Diagnosis not present

## 2019-09-29 DIAGNOSIS — I700 Atherosclerosis of aorta: Secondary | ICD-10-CM | POA: Diagnosis not present

## 2019-09-29 DIAGNOSIS — E785 Hyperlipidemia, unspecified: Secondary | ICD-10-CM | POA: Diagnosis not present

## 2019-09-29 DIAGNOSIS — J310 Chronic rhinitis: Secondary | ICD-10-CM

## 2019-09-29 DIAGNOSIS — E559 Vitamin D deficiency, unspecified: Secondary | ICD-10-CM

## 2019-09-29 DIAGNOSIS — J301 Allergic rhinitis due to pollen: Secondary | ICD-10-CM | POA: Diagnosis not present

## 2019-09-29 DIAGNOSIS — I739 Peripheral vascular disease, unspecified: Secondary | ICD-10-CM

## 2019-09-29 DIAGNOSIS — J439 Emphysema, unspecified: Secondary | ICD-10-CM

## 2019-09-29 DIAGNOSIS — Z5181 Encounter for therapeutic drug level monitoring: Secondary | ICD-10-CM | POA: Diagnosis not present

## 2019-09-29 MED ORDER — EZETIMIBE 10 MG PO TABS: 10.0000 mg | ORAL_TABLET | Freq: Every day | ORAL | 3 refills | Status: DC

## 2019-09-29 MED ORDER — DULOXETINE HCL 30 MG PO CPEP: 30.0000 mg | ORAL_CAPSULE | Freq: Every day | ORAL | 3 refills | Status: DC

## 2019-09-29 NOTE — Patient Instructions (Addendum)
[facility] at [facility] [address] [place],  [place]  [address] Get Driving Directions Main: [phone]     Edema  Edema is when you have too much fluid in your body or under your skin. Edema may make your legs, feet, and ankles swell up. Swelling is also common in looser tissues, like around your eyes. This is a common condition. It gets more common as you get older. There are many possible causes of edema. Eating too much salt (sodium) and being on your feet or sitting for a long time can cause edema in your legs, feet, and ankles. Hot weather may make edema worse. Edema is usually painless. Your skin may look swollen or shiny. Follow these instructions at home:  Keep the swollen body part raised (elevated) above the level of your heart when you are sitting or lying down.  Do not sit still or stand for a long time.  Do not wear tight clothes. Do not wear garters on your upper legs.  Exercise your legs. This can help the swelling go down.  Wear elastic bandages or support stockings as told by your doctor.  Eat a low-salt (low-sodium) diet to reduce fluid as told by your doctor.  Depending on the cause of your swelling, you may need to limit how much fluid you drink (fluid restriction).  Take over-the-counter and prescription medicines only as told by your doctor. Contact a doctor if:  Treatment is not working.  You have heart, liver, or kidney disease and have symptoms of edema.  You have sudden and unexplained weight gain. Get help right away if:  You have shortness of breath or chest pain.  You cannot breathe when you lie down.  You have pain, redness, or warmth in the swollen areas.  You have heart, liver, or kidney disease and get edema all of a sudden.  You have a fever and your symptoms get worse all of a sudden. Summary  Edema is when you have too much fluid in your body or under your skin.  Edema may make your legs, feet, and  ankles swell up. Swelling is also common in looser tissues, like around your eyes.  Raise (elevate) the swollen body part above the level of your heart when you are sitting or lying down.  Follow your doctor's instructions about diet and how much fluid you can drink (fluid restriction). This information is not intended to replace advice given to you by your health care provider. Make sure you discuss any questions you have with your health care provider. Document Revised: 03/30/2017 Document Reviewed: 04/14/2016 Elsevier Patient Education  2020 [employer].

## 2019-09-29 NOTE — Progress Notes (Signed)
Name: Dana Peters   MRN: [id number]    DOB: 05-31-1945   Date:09/29/2019       Progress Note  Chief Complaint  Patient presents with  . Follow-up    6 month  . Edema  . Hyperlipidemia     Subjective:   Dana Peters is a 74 y.o. female, presents to clinic for routine f/up   She is worried about prediabetes asks if there are medicines she should take  Hypertension:  Using HCTZ 1-2 x a week/prn for LE edema - has also been advised to elevate legs, avoid salt and wear compression stockings  Pt reports good med compliance and denies any SE.  No lightheadedness, hypotension, syncope. Blood pressure today is well controlled. BP Readings from Last 3 Encounters:  09/29/19 124/86  09/11/19 (!) 141/89  08/29/19 133/79   Pt denies CP, SOB, exertional sx, palpitation, Ha's, visual disturbances LE edema is still bothering her - she is not doing a lot of recommended tx/conservative management  Hx of CKD noted? last GFR 61, creatinine 1.05 BUN 20  Hyperlipidemia: Currently treated with zetia, pt reports good med compliance- statin allergy (itching?) Last Lipids: Lab Results  Component Value Date   CHOL 147 02/25/2019   HDL 53 02/25/2019   LDLCALC 70 02/25/2019   TRIG 153 (H) 02/25/2019   CHOLHDL 2.8 02/25/2019   - Denies: Chest pain, shortness of breath, myalgias, claudication CAD/PAD/Thoracic aortic atherosclerosis  AR and Emphysema: She quit smoking more than 10 years ago, she does annual low dose CT screening, uses allergy meds PRN, rarely feels SOB  Bilateral foot neuropathy: On cymbalta 30 mg daily Works to help manage sx, no change or worsening, no concerns or SE  Osteopenia/Vitamin D Deficiency: She is due for repeat bone density - previously ordered by PCP, but still not done, will reorder since PCP left the area  She is taking OTC supplements - Vit D 3 2000 IU     Current Outpatient Medications:  .  aspirin EC 81 MG tablet, Take 1 tablet (81  mg total) by mouth daily., Disp: 30 tablet, Rfl: 11 .  Cetirizine HCl (ZYRTEC ALLERGY) 10 MG TBDP, Take 10 mg by mouth at bedtime., Disp: 30 tablet, Rfl: 0 .  Cholecalciferol (VITAMIN D) 2000 units CAPS, Take by mouth daily., Disp: , Rfl:  .  DULoxetine (CYMBALTA) 30 MG capsule, Take 1 capsule by mouth once daily, Disp: 90 capsule, Rfl: 0 .  ezetimibe (ZETIA) 10 MG tablet, Take 1 tablet (10 mg total) by mouth daily., Disp: 90 tablet, Rfl: 3 .  fluticasone (FLONASE) 50 MCG/ACT nasal spray, Place 2 sprays into both nostrils daily., Disp: 16 g, Rfl: 6 .  hydrochlorothiazide (HYDRODIURIL) 12.5 MG tablet, Take 1 tablet (12.5 mg total) by mouth daily. (Patient taking differently: Take 12.5 mg by mouth daily. Takes when she is swelling), Disp: 90 tablet, Rfl: 3 .  loratadine (CLARITIN) 10 MG tablet, Take 1 tablet (10 mg total) by mouth daily as needed., Disp: 90 tablet, Rfl: 1 .  traMADol (ULTRAM) 50 MG tablet, Take 1 tablet (50 mg total) by mouth every 8 (eight) hours as needed., Disp: 10 tablet, Rfl: 0  Patient Active Problem List   Diagnosis Date Noted  . Personal history of colonic polyps   . Bilateral lower extremity edema 09/27/2017  . Emphysema of lung (HCC) 08/14/2017  . Ganglion cyst of finger 06/25/2017  . Full code status 06/25/2017  . Osteoarthritis of knee 06/12/2017  . Baker's  cyst of knee, right 06/04/2017  . Thoracic aortic atherosclerosis (HCC) 08/01/2016  . Osteopenia 06/15/2016  . Obesity (BMI 30.0-34.9) 05/29/2016  . Urinary hesitancy 02/14/2016  . PAD (peripheral artery disease) (HCC) 09/09/2015  . Coronary artery calcification seen on CT scan 07/20/2015  . Personal history of tobacco use, presenting hazards to health 07/14/2015  . Foraminal stenosis of lumbar region 01/28/2015  . Facet arthropathy, lumbar 01/28/2015  . Spinal stenosis of lumbar region 01/28/2015  . Neuropathy of both feet 01/07/2015  . Leg pain, bilateral 01/07/2015  . Leg heaviness 01/07/2015  . CKD  (chronic kidney disease) stage 2, GFR 60-89 ml/min 10/17/2014  . Allergic rhinitis 10/14/2014  . Dyslipidemia 10/14/2014  . Hypertension   . Vitamin D deficiency disease   . GERD (gastroesophageal reflux disease) 02/02/2014    Past Surgical History:  Procedure Laterality Date  . ABDOMINAL HYSTERECTOMY     complete, due to heavy  . CATARACT EXTRACTION, BILATERAL    . COLONOSCOPY WITH PROPOFOL N/A 08/29/2019   Procedure: COLONOSCOPY WITH PROPOFOL;  Surgeon: Midge Minium, MD;  Location: [facility];  Service: Endoscopy;  Laterality: N/A;  priority 4  . ECTOPIC PREGNANCY SURGERY      Family History  Problem Relation Age of Onset  . Cancer Mother        colon  . Hypertension Mother   . Alzheimer's disease Mother   . Cancer Father        prostate  . Cancer Brother        lung  . Stroke Brother   . Cancer Brother        prostate and brain  . Cancer Brother        throat  . Diabetes Paternal Aunt   . Breast cancer Paternal Aunt   . Healthy Daughter   . Cirrhosis Brother     Social History   Tobacco Use  . Smoking status: Former Smoker    Packs/day: 1.00    Years: 30.00    Pack years: 30.00    Types: Cigarettes    Quit date: 10/13/2005    Years since quitting: 13.9  . Smokeless tobacco: Former Neurosurgeon    Types: Snuff    Quit date: 10/09/2011  Vaping Use  . Vaping Use: Never used  Substance Use Topics  . Alcohol use: No    Alcohol/week: 0.0 standard drinks  . Drug use: No     Allergies  Allergen Reactions  . Atorvastatin Itching  . Codeine Sulfate Itching  . Pravastatin Nausea Only  . Protonix [Pantoprazole Sodium] Other (See Comments)    Legs felt heavy  . Tylenol With Codeine #3  [Acetaminophen-Codeine] Itching  . Penicillin G Rash    Health Maintenance  Topic Date Due  . DEXA SCAN  07/19/2018  . INFLUENZA VACCINE  11/09/2019  . MAMMOGRAM  01/23/2020  . TETANUS/TDAP  01/11/2024  . COLONOSCOPY  08/28/2024  . COVID-19 Vaccine  Completed  .  Hepatitis C Screening  Completed  . PNA vac Low Risk Adult  Completed    Chart Review Today: I personally reviewed active problem list, medication list, allergies, family history, social history, health maintenance, notes from last encounter, lab results, imaging with the patient/caregiver today.   Review of Systems  10 Systems reviewed and are negative for acute change except as noted in the HPI.  Objective:   Vitals:   09/29/19 0901  BP: 124/86  Pulse: 83  Resp: 16  Temp: 97.6 F (36.4 C)  TempSrc: Temporal  SpO2: 98%  Weight: 207 lb 8 oz (94.1 kg)  Height: 5\' 7"  (1.702 m)    Body mass index is 32.5 kg/m.  Physical Exam Vitals and nursing note reviewed.  Constitutional:      General: She is not in acute distress.    Appearance: Normal appearance. She is well-developed. She is obese. She is not ill-appearing, toxic-appearing or diaphoretic.     Interventions: Face mask in place.  HENT:     Head: Normocephalic and atraumatic.     Right Ear: External ear normal.     Left Ear: External ear normal.  Eyes:     General: Lids are normal. No scleral icterus.       Right eye: No discharge.        Left eye: No discharge.     Conjunctiva/sclera: Conjunctivae normal.  Neck:     Trachea: Phonation normal. No tracheal deviation.  Cardiovascular:     Rate and Rhythm: Normal rate and regular rhythm.     Pulses: Normal pulses.          Radial pulses are 2+ on the right side and 2+ on the left side.       Posterior tibial pulses are 2+ on the right side and 2+ on the left side.     Heart sounds: Normal heart sounds. No murmur heard.  No friction rub. No gallop.      Comments: Mild b/l LE edema, non-pitting Pulmonary:     Effort: Pulmonary effort is normal. No respiratory distress.     Breath sounds: Normal breath sounds. No stridor. No wheezing, rhonchi or rales.  Chest:     Chest wall: No tenderness.  Abdominal:     General: Bowel sounds are normal. There is no distension.      Palpations: Abdomen is soft.     Tenderness: There is no abdominal tenderness. There is no guarding or rebound.  Musculoskeletal:     Right lower leg: Edema present.     Left lower leg: Edema present.  Lymphadenopathy:     Cervical: No cervical adenopathy.  Skin:    General: Skin is warm and dry.     Capillary Refill: Capillary refill takes less than 2 seconds.     Coloration: Skin is not jaundiced or pale.     Findings: No rash.  Neurological:     Mental Status: She is alert. Mental status is at baseline.     Motor: No abnormal muscle tone.     Gait: Gait normal.  Psychiatric:        Speech: Speech normal.        Behavior: Behavior normal.         Assessment & Plan:     ICD-10-CM   1. Dyslipidemia  E78.5 Lipid panel    COMPLETE METABOLIC PANEL WITH GFR   managing with zetia and lifestyle, allergy to statin  2. Essential hypertension  I10 COMPLETE METABOLIC PANEL WITH GFR   stable well controlled HTN with lifestyle,  PRN use of HCTZ  3. Bilateral lower extremity edema  R60.0 COMPLETE METABOLIC PANEL WITH GFR   she uses HCTZ prn, encouraged compression stockings, low salt, elevation, avoiding stasis  4. Pulmonary emphysema, unspecified emphysema type (HCC)  J43.9    prior smoker, noted through CT lung cancer screening, denies any SOB, not on inhalers  5. CKD (chronic kidney disease) stage 2, GFR 60-89 ml/min  N18.2 COMPLETE METABOLIC PANEL WITH GFR   has been  stable - monitoring  6. Seasonal allergic rhinitis due to pollen  J30.1    currently more sx, reviewed her meds, encouraged better med compliance in allergy seasons  7. Vitamin D deficiency disease  E55.9    supplementing daily, hx of osteopenia   8. Gastroesophageal reflux disease without esophagitis  K21.9    sx well controlled, not currently on meds  9. PAD (peripheral artery disease) (HCC)  I73.9    No claudication sx, statin allergy  10. Thoracic aortic atherosclerosis (HCC)  I70.0    monitoring  cholesterol, unable to take statin  11. Rhinitis, unspecified type  J31.0    hx of allergies, uses antihistamine prn, encouraged her to use daily, switch kind if she has been on for long time  12. Encounter for medication monitoring  Z51.81 Lipid panel    COMPLETE METABOLIC PANEL WITH GFR    CBC with Differential/Platelet     Return for 4-6 routine f/up on (recheck BP, edema, med recheck).   Delsa Grana, PA-C 09/29/19 9:15 AM

## 2019-09-30 LAB — COMPLETE METABOLIC PANEL WITHOUT GFR
AG Ratio: 1.8 (calc) (ref 1.0–2.5)
ALT: 17 U/L (ref 6–29)
AST: 19 U/L (ref 10–35)
Albumin: 4.3 g/dL (ref 3.6–5.1)
Alkaline phosphatase (APISO): 70 U/L (ref 37–153)
BUN/Creatinine Ratio: 12 (calc) (ref 6–22)
BUN: 12 mg/dL (ref 7–25)
CO2: 29 mmol/L (ref 20–32)
Calcium: 9.8 mg/dL (ref 8.6–10.4)
Chloride: 104 mmol/L (ref 98–110)
Creat: 1.02 mg/dL — ABNORMAL HIGH (ref 0.60–0.93)
GFR, Est African American: 63 mL/min/{1.73_m2}
GFR, Est Non African American: 54 mL/min/{1.73_m2} — ABNORMAL LOW
Globulin: 2.4 g/dL (ref 1.9–3.7)
Glucose, Bld: 97 mg/dL (ref 65–99)
Potassium: 4.1 mmol/L (ref 3.5–5.3)
Sodium: 142 mmol/L (ref 135–146)
Total Bilirubin: 0.5 mg/dL (ref 0.2–1.2)
Total Protein: 6.7 g/dL (ref 6.1–8.1)

## 2019-09-30 LAB — CBC WITH DIFFERENTIAL/PLATELET
Absolute Monocytes: 543 {cells}/uL (ref 200–950)
Basophils Absolute: 49 {cells}/uL (ref 0–200)
Basophils Relative: 0.6 %
Eosinophils Absolute: 170 {cells}/uL (ref 15–500)
Eosinophils Relative: 2.1 %
HCT: 40.2 % (ref 35.0–45.0)
Hemoglobin: 13.7 g/dL (ref 11.7–15.5)
Lymphs Abs: 1944 {cells}/uL (ref 850–3900)
MCH: 32.4 pg (ref 27.0–33.0)
MCHC: 34.1 g/dL (ref 32.0–36.0)
MCV: 95 fL (ref 80.0–100.0)
MPV: 11.1 fL (ref 7.5–12.5)
Monocytes Relative: 6.7 %
Neutro Abs: 5395 {cells}/uL (ref 1500–7800)
Neutrophils Relative %: 66.6 %
Platelets: 172 10*3/uL (ref 140–400)
RBC: 4.23 Million/uL (ref 3.80–5.10)
RDW: 13 % (ref 11.0–15.0)
Total Lymphocyte: 24 %
WBC: 8.1 10*3/uL (ref 3.8–10.8)

## 2019-09-30 LAB — LIPID PANEL
Cholesterol: 155 mg/dL
HDL: 52 mg/dL
LDL Cholesterol (Calc): 77 mg/dL
Non-HDL Cholesterol (Calc): 103 mg/dL
Total CHOL/HDL Ratio: 3 (calc)
Triglycerides: 162 mg/dL — ABNORMAL HIGH

## 2019-10-17 ENCOUNTER — Telehealth: Payer: Self-pay | Admitting: *Deleted

## 2019-10-17 DIAGNOSIS — Z87891 Personal history of nicotine dependence: Secondary | ICD-10-CM

## 2019-10-17 DIAGNOSIS — Z122 Encounter for screening for malignant neoplasm of respiratory organs: Secondary | ICD-10-CM

## 2019-10-17 NOTE — Telephone Encounter (Signed)
(  10/17/2019) Pt has been notified that lung cancer screening CT scan is due currently or will be in near future. Confirmed pt is within appropriate age range, and asymptomatic. Pt denies illness that would prevent curative treatment for lung cancer if found. Verified smoking history (Former Smoker since 2007, 1 ppd). Pt did receive 2nd COVID VX on approx. (01/21) Pt is agreeable for CT scan being scheduled, prefers AM appt (7/16 at 10 am if possible) SRW

## 2019-10-20 NOTE — Telephone Encounter (Signed)
Smoking history: former, quit 2007, 30 pack year

## 2019-10-20 NOTE — Addendum Note (Signed)
Addended by: Jonne Ply on: 10/20/2019 09:03 AM   Modules accepted: Orders

## 2019-10-24 ENCOUNTER — Other Ambulatory Visit: Payer: Self-pay

## 2019-10-24 ENCOUNTER — Ambulatory Visit
Admission: RE | Admit: 2019-10-24 | Discharge: 2019-10-24 | Disposition: A | Discharge status: Home / Self Care | Payer: Medicare Other | Source: Ambulatory Visit | Attending: Nurse Practitioner | Admitting: Nurse Practitioner

## 2019-10-24 DIAGNOSIS — Z87891 Personal history of nicotine dependence: Secondary | ICD-10-CM | POA: Insufficient documentation

## 2019-10-24 DIAGNOSIS — Z122 Encounter for screening for malignant neoplasm of respiratory organs: Secondary | ICD-10-CM | POA: Diagnosis present

## 2019-10-29 ENCOUNTER — Encounter: Payer: Self-pay | Admitting: *Deleted

## 2019-11-21 ENCOUNTER — Encounter: Payer: Self-pay | Admitting: Family Medicine

## 2019-11-21 ENCOUNTER — Other Ambulatory Visit: Payer: Self-pay

## 2019-11-21 ENCOUNTER — Ambulatory Visit (INDEPENDENT_AMBULATORY_CARE_PROVIDER_SITE_OTHER): Payer: Medicare Other | Admitting: Family Medicine

## 2019-11-21 VITALS — BP 134/82 | HR 84 | Temp 97.9°F | Resp 16 | Ht 67.0 in | Wt 212.1 lb

## 2019-11-21 DIAGNOSIS — I739 Peripheral vascular disease, unspecified: Secondary | ICD-10-CM

## 2019-11-21 DIAGNOSIS — I700 Atherosclerosis of aorta: Secondary | ICD-10-CM | POA: Diagnosis not present

## 2019-11-21 DIAGNOSIS — I2510 Atherosclerotic heart disease of native coronary artery without angina pectoris: Secondary | ICD-10-CM | POA: Diagnosis not present

## 2019-11-21 DIAGNOSIS — E785 Hyperlipidemia, unspecified: Secondary | ICD-10-CM | POA: Diagnosis not present

## 2019-11-21 DIAGNOSIS — R600 Localized edema: Secondary | ICD-10-CM

## 2019-11-21 MED ORDER — FUROSEMIDE 20 MG PO TABS: 10.0000 mg | ORAL_TABLET | Freq: Every day | ORAL | 0 refills | Status: DC | PRN

## 2019-11-21 MED ORDER — ROSUVASTATIN CALCIUM 5 MG PO TABS: ORAL_TABLET | ORAL | 3 refills | Status: DC

## 2019-11-21 MED ORDER — POTASSIUM CHLORIDE CRYS ER 20 MEQ PO TBCR: EXTENDED_RELEASE_TABLET | ORAL | 0 refills | Status: DC

## 2019-11-21 NOTE — Patient Instructions (Addendum)
Keep walking, elevating legs, wear your compression socks and try and work on low sodium diet  Stop HCTZ med  You can use lasix only very sparingly for severe swelling If you have feet swollen for 2-3 days and its not improving with measures noted above, or if you can leave a dent (finger impression) in swollen tissue, then take lasix 1 to 2 days - take first thing in the morning When you take the lasix you need to also take the potassium supplement  IT will make you pee a lot  Try the statin medication every other day at bedtime, follow up here in 3 months to recheck how you are tolerating the medication    Low-Sodium Eating Plan   Sodium, which is an element that makes up salt, helps you maintain a healthy balance of fluids in your body. Too much sodium can increase your blood pressure and cause fluid and waste to be held in your body. Your health care provider or dietitian may recommend following this plan if you have high blood pressure (hypertension), kidney disease, liver disease, or heart failure. Eating less sodium can help lower your blood pressure, reduce swelling, and protect your heart, liver, and kidneys. What are tips for following this plan? General guidelines  Most people on this plan should limit their sodium intake to 1,500-2,000 mg (milligrams) of sodium each day. Reading food labels   The Nutrition Facts label lists the amount of sodium in one serving of the food. If you eat more than one serving, you must multiply the listed amount of sodium by the number of servings.  Choose foods with less than 140 mg of sodium per serving.  Avoid foods with 300 mg of sodium or more per serving. Shopping  Look for lower-sodium products, often labeled as "low-sodium" or "no salt added."  Always check the sodium content even if foods are labeled as "unsalted" or "no salt added".  Buy fresh foods. ? Avoid canned foods and premade or frozen meals. ? Avoid canned, cured, or  processed meats  Buy breads that have less than 80 mg of sodium per slice. Cooking  Eat more home-cooked food and less restaurant, buffet, and fast food.  Avoid adding salt when cooking. Use salt-free seasonings or herbs instead of table salt or sea salt. Check with your health care provider or pharmacist before using salt substitutes.  Cook with plant-based oils, such as canola, sunflower, or olive oil. Meal planning  When eating at a restaurant, ask that your food be prepared with less salt or no salt, if possible.  Avoid foods that contain MSG (monosodium glutamate). MSG is sometimes added to [place] food, bouillon, and some canned foods. What foods are recommended? The items listed may not be a complete list. Talk with your dietitian about what dietary choices are best for you. Grains Low-sodium cereals, including oats, puffed wheat and rice, and shredded wheat. Low-sodium crackers. Unsalted rice. Unsalted pasta. Low-sodium bread. Whole-grain breads and whole-grain pasta. Vegetables Fresh or frozen vegetables. "No salt added" canned vegetables. "No salt added" tomato sauce and paste. Low-sodium or reduced-sodium tomato and vegetable juice. Fruits Fresh, frozen, or canned fruit. Fruit juice. Meats and other protein foods Fresh or frozen (no salt added) meat, poultry, seafood, and fish. Low-sodium canned tuna and salmon. Unsalted nuts. Dried peas, beans, and lentils without added salt. Unsalted canned beans. Eggs. Unsalted nut butters. Dairy Milk. Soy milk. Cheese that is naturally low in sodium, such as ricotta cheese, fresh mozzarella, or  Swiss cheese Low-sodium or reduced-sodium cheese. Cream cheese. Yogurt. Fats and oils Unsalted butter. Unsalted margarine with no trans fat. Vegetable oils such as canola or olive oils. Seasonings and other foods Fresh and dried herbs and spices. Salt-free seasonings. Low-sodium mustard and ketchup. Sodium-free salad dressing. Sodium-free light  mayonnaise. Fresh or refrigerated horseradish. Lemon juice. Vinegar. Homemade, reduced-sodium, or low-sodium soups. Unsalted popcorn and pretzels. Low-salt or salt-free chips. What foods are not recommended? The items listed may not be a complete list. Talk with your dietitian about what dietary choices are best for you. Grains Instant hot cereals. Bread stuffing, pancake, and biscuit mixes. Croutons. Seasoned rice or pasta mixes. Noodle soup cups. Boxed or frozen macaroni and cheese. Regular salted crackers. Self-rising flour. Vegetables Sauerkraut, pickled vegetables, and relishes. Olives. [place] fries. Onion rings. Regular canned vegetables (not low-sodium or reduced-sodium). Regular canned tomato sauce and paste (not low-sodium or reduced-sodium). Regular tomato and vegetable juice (not low-sodium or reduced-sodium). Frozen vegetables in sauces. Meats and other protein foods Meat or fish that is salted, canned, smoked, spiced, or pickled. Bacon, ham, sausage, hotdogs, corned beef, chipped beef, packaged lunch meats, salt pork, jerky, pickled herring, anchovies, regular canned tuna, sardines, salted nuts. Dairy Processed cheese and cheese spreads. Cheese curds. Blue cheese. Feta cheese. String cheese. Regular cottage cheese. Buttermilk. Canned milk. Fats and oils Salted butter. Regular margarine. Ghee. Bacon fat. Seasonings and other foods Onion salt, garlic salt, seasoned salt, table salt, and sea salt. Canned and packaged gravies. Worcestershire sauce. Tartar sauce. Barbecue sauce. Teriyaki sauce. Soy sauce, including reduced-sodium. Steak sauce. Fish sauce. Oyster sauce. Cocktail sauce. Horseradish that you find on the shelf. Regular ketchup and mustard. Meat flavorings and tenderizers. Bouillon cubes. Hot sauce and Tabasco sauce. Premade or packaged marinades. Premade or packaged taco seasonings. Relishes. Regular salad dressings. Salsa. Potato and tortilla chips. Corn chips and puffs. Salted  popcorn and pretzels. Canned or dried soups. Pizza. Frozen entrees and pot pies. Summary  Eating less sodium can help lower your blood pressure, reduce swelling, and protect your heart, liver, and kidneys.  Most people on this plan should limit their sodium intake to 1,500-2,000 mg (milligrams) of sodium each day.  Canned, boxed, and frozen foods are high in sodium. Restaurant foods, fast foods, and pizza are also very high in sodium. You also get sodium by adding salt to food.  Try to cook at home, eat more fresh fruits and vegetables, and eat less fast food, canned, processed, or prepared foods. This information is not intended to replace advice given to you by your health care provider. Make sure you discuss any questions you have with your health care provider. Document Revised: 03/09/2017 Document Reviewed: 03/20/2016 Elsevier Patient Education  2020 [employer].

## 2019-11-21 NOTE — Progress Notes (Signed)
Patient ID: Dana Peters, female    DOB: 11-28-1945, 74 y.o.   MRN: [id number]  PCP: Danelle Berry, PA-C  Chief Complaint  Patient presents with  . Foot Swelling    for 4 days    Subjective:   Dana Peters is a 74 y.o. female, presents to clinic with CC of the following:  HPI  Slightly worse bilateral foot swelling x 3-4 days, hx of the same.  No improvement with HCTZ, she is trying to walk more No weight gain, SOB, orthopnea  HLD - previously on zetia Lab Results  Component Value Date   CHOL 155 09/29/2019   HDL 52 09/29/2019   LDLCALC 77 09/29/2019   TRIG 162 (H) 09/29/2019   CHOLHDL 3.0 09/29/2019   The 10-year ASCVD risk score Denman George DC Jr., et al., 2013) is: 13.9%   Values used to calculate the score:     Age: 21 years     Sex: Female     Is Non-Hispanic African American: Yes     Diabetic: No     Tobacco smoker: No     Systolic Blood Pressure: 134 mmHg     Is BP treated: No     HDL Cholesterol: 52 mg/dL     Total Cholesterol: 155 mg/dL  Hx of PAD, CAD on imaging and aortic atherosclerosis Previously nauseas with pravastatin and some itching possibly with atorvastain      Patient Active Problem List   Diagnosis Date Noted  . Personal history of colonic polyps   . Bilateral lower extremity edema 09/27/2017  . Emphysema of lung (HCC) 08/14/2017  . Ganglion cyst of finger 06/25/2017  . Full code status 06/25/2017  . Osteoarthritis of knee 06/12/2017  . Baker's cyst of knee, right 06/04/2017  . Thoracic aortic atherosclerosis (HCC) 08/01/2016  . Osteopenia 06/15/2016  . Obesity (BMI 30.0-34.9) 05/29/2016  . Urinary hesitancy 02/14/2016  . PAD (peripheral artery disease) (HCC) 09/09/2015  . Coronary artery calcification seen on CT scan 07/20/2015  . Personal history of tobacco use, presenting hazards to health 07/14/2015  . Foraminal stenosis of lumbar region 01/28/2015  . Facet arthropathy, lumbar 01/28/2015  . Spinal stenosis of lumbar  region 01/28/2015  . Neuropathy of both feet 01/07/2015  . Leg pain, bilateral 01/07/2015  . Leg heaviness 01/07/2015  . CKD (chronic kidney disease) stage 2, GFR 60-89 ml/min 10/17/2014  . Allergic rhinitis 10/14/2014  . Dyslipidemia 10/14/2014  . Hypertension   . Vitamin D deficiency disease   . GERD (gastroesophageal reflux disease) 02/02/2014      Current Outpatient Medications:  .  aspirin EC 81 MG tablet, Take 1 tablet (81 mg total) by mouth daily., Disp: 30 tablet, Rfl: 11 .  Cetirizine HCl (ZYRTEC ALLERGY) 10 MG TBDP, Take 10 mg by mouth at bedtime., Disp: 30 tablet, Rfl: 0 .  Cholecalciferol (VITAMIN D) 2000 units CAPS, Take by mouth daily., Disp: , Rfl:  .  DULoxetine (CYMBALTA) 30 MG capsule, Take 1 capsule (30 mg total) by mouth daily., Disp: 90 capsule, Rfl: 3 .  ezetimibe (ZETIA) 10 MG tablet, Take 1 tablet (10 mg total) by mouth daily., Disp: 90 tablet, Rfl: 3 .  fluticasone (FLONASE) 50 MCG/ACT nasal spray, Place 2 sprays into both nostrils daily., Disp: 16 g, Rfl: 6 .  loratadine (CLARITIN) 10 MG tablet, Take 1 tablet (10 mg total) by mouth daily as needed., Disp: 90 tablet, Rfl: 1 .  traMADol (ULTRAM) 50 MG tablet, Take 1 tablet (  50 mg total) by mouth every 8 (eight) hours as needed., Disp: 10 tablet, Rfl: 0   Allergies  Allergen Reactions  . Atorvastatin Itching  . Codeine Sulfate Itching  . Pravastatin Nausea Only  . Protonix [Pantoprazole Sodium] Other (See Comments)    Legs felt heavy  . Tylenol With Codeine #3  [Acetaminophen-Codeine] Itching  . Penicillin G Rash     Social History   Tobacco Use  . Smoking status: Former Smoker    Packs/day: 1.00    Years: 30.00    Pack years: 30.00    Types: Cigarettes    Quit date: 10/13/2005    Years since quitting: 14.1  . Smokeless tobacco: Former Neurosurgeon    Types: Snuff    Quit date: 10/09/2011  Vaping Use  . Vaping Use: Never used  Substance Use Topics  . Alcohol use: No    Alcohol/week: 0.0 standard drinks   . Drug use: No      Chart Review Today: I personally reviewed active problem list, medication list, allergies, family history, social history, health maintenance, notes from last encounter, lab results, imaging with the patient/caregiver today.   Review of Systems 10 Systems reviewed and are negative for acute change except as noted in the HPI.     Objective:   Vitals:   11/21/19 1123  BP: 134/82  Pulse: 84  Resp: 16  Temp: 97.9 F (36.6 C)  TempSrc: Oral  SpO2: 96%  Weight: 212 lb 1.6 oz (96.2 kg)  Height: 5\' 7"  (1.702 m)    Body mass index is 33.22 kg/m.  Physical Exam Vitals and nursing note reviewed.  Constitutional:      General: She is not in acute distress.    Appearance: Normal appearance. She is well-developed. She is obese. She is not ill-appearing, toxic-appearing or diaphoretic.     Interventions: Face mask in place.  HENT:     Head: Normocephalic and atraumatic.     Right Ear: External ear normal.     Left Ear: External ear normal.  Eyes:     General: Lids are normal. No scleral icterus.       Right eye: No discharge.        Left eye: No discharge.     Conjunctiva/sclera: Conjunctivae normal.  Neck:     Trachea: Phonation normal. No tracheal deviation.  Cardiovascular:     Rate and Rhythm: Normal rate and regular rhythm.     Pulses: Normal pulses.          Radial pulses are 2+ on the right side and 2+ on the left side.       Posterior tibial pulses are 2+ on the right side and 2+ on the left side.     Heart sounds: Normal heart sounds. No murmur heard.  No friction rub. No gallop.      Comments: Mild b/l LE and pedal edema, non-pitting Pulmonary:     Effort: Pulmonary effort is normal. No respiratory distress.     Breath sounds: Normal breath sounds. No stridor. No wheezing, rhonchi or rales.  Chest:     Chest wall: No tenderness.  Abdominal:     General: Bowel sounds are normal. There is no distension.     Palpations: Abdomen is soft.      Tenderness: There is no abdominal tenderness. There is no guarding or rebound.  Musculoskeletal:     Right lower leg: Edema present.     Left lower leg: Edema present.  Lymphadenopathy:  Cervical: No cervical adenopathy.  Skin:    General: Skin is warm and dry.     Capillary Refill: Capillary refill takes less than 2 seconds.     Coloration: Skin is not jaundiced or pale.     Findings: No rash.  Neurological:     Mental Status: She is alert. Mental status is at baseline.     Motor: No abnormal muscle tone.     Gait: Gait normal.  Psychiatric:        Speech: Speech normal.        Behavior: Behavior normal.      Results for orders placed or performed in visit on 10/01/19  Hemoglobin A1c  Result Value Ref Range   Hemoglobin A1C 5.6        Assessment & Plan:   1. Pedal edema Instructions to pt after not much improvement with conservative tx, previously pt was using HCTZ to help with edema - d/c HCTZ  Keep walking, elevating legs, wear your compression socks and try and work on low sodium diet  Stop HCTZ med  You can use lasix only very sparingly for severe swelling If you have feet swollen for 2-3 days and its not improving with measures noted above, or if you can leave a dent (finger impression) in swollen tissue, then take lasix 1 to 2 days - take first thing in the morning When you take the lasix you need to also take the potassium supplement  IT will make you pee a lot  Low-Sodium Eating Plan - furosemide (LASIX) 20 MG tablet; Take 0.5-1 tablets (10-20 mg total) by mouth daily as needed. In early morning only for severe foot or LE edema, take for 1-2 days at a time only  Dispense: 20 tablet; Refill: 0 - potassium chloride SA (KLOR-CON) 20 MEQ tablet; Take daily prn when taking lasix/furosemide  Dispense: 20 tablet; Refill: 0  2. Dyslipidemia High ASCVD risk score with PAD, CAD, and thoracic aortic atherosclerosis - past statin SE are not contraindications to statins  - discussed meds, past labs, ASCVD risk score and pt agreed to try crestor low dose every other day - rosuvastatin (CRESTOR) 5 MG tablet; Take 5 mg po every other day at bedtime for atherosclerotic disease  Dispense: 45 tablet; Refill: 3  Close f/up on med tolerance/SE/rechecking labs   3. PAD (peripheral artery disease) (HCC) - rosuvastatin (CRESTOR) 5 MG tablet; Take 5 mg po every other day at bedtime for atherosclerotic disease  Dispense: 45 tablet; Refill: 3  4. Coronary artery calcification seen on CT scan - rosuvastatin (CRESTOR) 5 MG tablet; Take 5 mg po every other day at bedtime for atherosclerotic disease  Dispense: 45 tablet; Refill: 3  5. Thoracic aortic atherosclerosis (HCC) - rosuvastatin (CRESTOR) 5 MG tablet; Take 5 mg po every other day at bedtime for atherosclerotic disease  Dispense: 45 tablet; Refill: 3      Danelle Berry, PA-C 11/21/19 11:36 AM

## 2019-11-26 ENCOUNTER — Encounter: Payer: Self-pay | Admitting: Family Medicine

## 2019-11-26 ENCOUNTER — Other Ambulatory Visit: Payer: Self-pay

## 2019-11-26 ENCOUNTER — Telehealth (INDEPENDENT_AMBULATORY_CARE_PROVIDER_SITE_OTHER): Payer: Medicare Other | Admitting: Family Medicine

## 2019-11-26 VITALS — Temp 99.4°F

## 2019-11-26 DIAGNOSIS — J209 Acute bronchitis, unspecified: Secondary | ICD-10-CM | POA: Diagnosis not present

## 2019-11-26 MED ORDER — AZITHROMYCIN 250 MG PO TABS: 250.0000 mg | ORAL_TABLET | Freq: Every day | ORAL | 0 refills | Status: DC

## 2019-11-26 MED ORDER — BENZONATATE 100 MG PO CAPS: 100.0000 mg | ORAL_CAPSULE | Freq: Three times a day (TID) | ORAL | 0 refills | Status: DC | PRN

## 2019-11-26 MED ORDER — ALBUTEROL SULFATE 108 (90 BASE) MCG/ACT IN AEPB: 2.0000 | INHALATION_SPRAY | RESPIRATORY_TRACT | 1 refills | Status: DC | PRN

## 2019-11-26 MED ORDER — PREDNISONE 20 MG PO TABS: ORAL_TABLET | ORAL | 0 refills | Status: DC

## 2019-11-26 NOTE — Progress Notes (Signed)
Name: Dana Peters   MRN: [id number]    DOB: 07-Nov-1945   Date:11/26/2019       Progress Note  Subjective:    Chief Complaint  Chief Complaint  Patient presents with  . Bronchitis    wheezing, coughing for 2 days    I connected with  [name] on 11/26/19 at  9:40 AM EDT by telephone and verified that I am speaking with the correct person using two identifiers.   I discussed the limitations, risks, security and privacy concerns of performing an evaluation and management service by telephone and the availability of in person appointments. Staff also discussed with the patient that there may be a patient responsible charge related to this service. Patient was given limitations of telephone encounter and did agree to proceed today.  Patient Location: Home Provider Location: Office [employer] [facility] [employer]) Additional Individuals present: No    Two days of cold and cough- though she denies any new nasal sx, she has productive cough and chest congestion with wheeze   URI  Episode onset: 2 days ago. Associated symptoms include coughing and wheezing. Pertinent negatives include no abdominal pain, chest pain, diarrhea, ear pain, headaches, nausea, rash, rhinorrhea, sore throat or vomiting.  Cough This is a recurrent problem. The problem has been gradually improving. The cough is productive of sputum (white sputum). Associated symptoms include wheezing. Pertinent negatives include no chest pain, chills, ear congestion, ear pain, fever, headaches, heartburn, hemoptysis, myalgias, nasal congestion, postnasal drip, rash, rhinorrhea, sore throat, shortness of breath, sweats or weight loss. Associated symptoms comments: "a little seasonal allergies that I take allergy medicine for". The symptoms are aggravated by lying down. She has tried OTC cough suppressant for the symptoms. The treatment provided mild relief. Her past medical history is significant for bronchitis, emphysema and  environmental allergies. There is no history of pneumonia. former smoker   She is vaccinated 98.7 temp oral  No known sick contacts   Patient Active Problem List   Diagnosis Date Noted  . Personal history of colonic polyps   . Bilateral lower extremity edema 09/27/2017  . Emphysema of lung (HCC) 08/14/2017  . Ganglion cyst of finger 06/25/2017  . Full code status 06/25/2017  . Osteoarthritis of knee 06/12/2017  . Baker's cyst of knee, right 06/04/2017  . Thoracic aortic atherosclerosis (HCC) 08/01/2016  . Osteopenia 06/15/2016  . Obesity (BMI 30.0-34.9) 05/29/2016  . Urinary hesitancy 02/14/2016  . PAD (peripheral artery disease) (HCC) 09/09/2015  . Coronary artery calcification seen on CT scan 07/20/2015  . Personal history of tobacco use, presenting hazards to health 07/14/2015  . Foraminal stenosis of lumbar region 01/28/2015  . Facet arthropathy, lumbar 01/28/2015  . Spinal stenosis of lumbar region 01/28/2015  . Neuropathy of both feet 01/07/2015  . Leg pain, bilateral 01/07/2015  . Leg heaviness 01/07/2015  . CKD (chronic kidney disease) stage 2, GFR 60-89 ml/min 10/17/2014  . Allergic rhinitis 10/14/2014  . Dyslipidemia 10/14/2014  . Hypertension   . Vitamin D deficiency disease   . GERD (gastroesophageal reflux disease) 02/02/2014    Social History   Tobacco Use  . Smoking status: Former Smoker    Packs/day: 1.00    Years: 30.00    Pack years: 30.00    Types: Cigarettes    Quit date: 10/13/2005    Years since quitting: 14.1  . Smokeless tobacco: Former Neurosurgeon    Types: Snuff    Quit date: 10/09/2011  Substance Use Topics  . Alcohol  use: No    Alcohol/week: 0.0 standard drinks     Current Outpatient Medications:  .  aspirin EC 81 MG tablet, Take 1 tablet (81 mg total) by mouth daily., Disp: 30 tablet, Rfl: 11 .  Cetirizine HCl (ZYRTEC ALLERGY) 10 MG TBDP, Take 10 mg by mouth at bedtime., Disp: 30 tablet, Rfl: 0 .  Cholecalciferol (VITAMIN D) 2000 units  CAPS, Take by mouth daily., Disp: , Rfl:  .  DULoxetine (CYMBALTA) 30 MG capsule, Take 1 capsule (30 mg total) by mouth daily., Disp: 90 capsule, Rfl: 3 .  ezetimibe (ZETIA) 10 MG tablet, Take 1 tablet (10 mg total) by mouth daily., Disp: 90 tablet, Rfl: 3 .  fluticasone (FLONASE) 50 MCG/ACT nasal spray, Place 2 sprays into both nostrils daily., Disp: 16 g, Rfl: 6 .  furosemide (LASIX) 20 MG tablet, Take 0.5-1 tablets (10-20 mg total) by mouth daily as needed. In early morning only for severe foot or LE edema, take for 1-2 days at a time only, Disp: 20 tablet, Rfl: 0 .  loratadine (CLARITIN) 10 MG tablet, Take 1 tablet (10 mg total) by mouth daily as needed., Disp: 90 tablet, Rfl: 1 .  potassium chloride SA (KLOR-CON) 20 MEQ tablet, Take daily prn when taking lasix/furosemide, Disp: 20 tablet, Rfl: 0 .  rosuvastatin (CRESTOR) 5 MG tablet, Take 5 mg po every other day at bedtime for atherosclerotic disease, Disp: 45 tablet, Rfl: 3 .  traMADol (ULTRAM) 50 MG tablet, Take 1 tablet (50 mg total) by mouth every 8 (eight) hours as needed., Disp: 10 tablet, Rfl: 0 .  Albuterol Sulfate (PROAIR RESPICLICK) 108 (90 Base) MCG/ACT AEPB, Inhale 2 puffs into the lungs every 4 (four) hours as needed (wheeze, SOB, coughing fits - acute bronchitis)., Disp: 1 each, Rfl: 1 .  azithromycin (ZITHROMAX Z-PAK) 250 MG tablet, Take 1 tablet (250 mg total) by mouth daily. 500mg  PO day 1, then 250mg  PO days 205, Disp: 6 tablet, Rfl: 0 .  benzonatate (TESSALON) 100 MG capsule, Take 1 capsule (100 mg total) by mouth 3 (three) times daily as needed for cough., Disp: 30 capsule, Rfl: 0 .  predniSONE (DELTASONE) 20 MG tablet, 2 tabs poqday 1-3, 1 tabs poqday 4-6, Disp: 9 tablet, Rfl: 0  Allergies  Allergen Reactions  . Atorvastatin Itching  . Codeine Sulfate Itching  . Pravastatin Nausea Only  . Protonix [Pantoprazole Sodium] Other (See Comments)    Legs felt heavy  . Tylenol With Codeine #3  [Acetaminophen-Codeine] Itching    . Penicillin G Rash    Chart Review: I personally reviewed active problem list, medication list, allergies, family history, social history, health maintenance, notes from last encounter, lab results, imaging with the patient/caregiver today.   Review of Systems  Constitutional: Negative.  Negative for activity change, appetite change, chills, fatigue, fever, unexpected weight change and weight loss.  HENT: Negative.  Negative for ear pain, postnasal drip, rhinorrhea and sore throat.   Eyes: Negative.   Respiratory: Positive for cough and wheezing. Negative for apnea, hemoptysis, choking, shortness of breath and stridor.   Cardiovascular: Negative.  Negative for chest pain, palpitations and leg swelling.  Gastrointestinal: Negative.  Negative for abdominal pain, blood in stool, constipation, diarrhea, heartburn, nausea and vomiting.  Endocrine: Negative.   Genitourinary: Negative.   Musculoskeletal: Negative.  Negative for arthralgias, gait problem, joint swelling and myalgias.  Skin: Negative.  Negative for pallor and rash.  Allergic/Immunologic: Positive for environmental allergies.  Neurological: Negative.  Negative for dizziness, syncope,  weakness and headaches.  Hematological: Negative.   Psychiatric/Behavioral: Negative.  Negative for dysphoric mood, self-injury and suicidal ideas. The patient is not nervous/anxious.   All other systems reviewed and are negative.    Objective:    Virtual encounter, vitals limited, only able to obtain the following Today's Vitals   11/26/19 0733  Temp: 99.4 F (37.4 C)  TempSrc: Oral   There is no height or weight on file to calculate BMI. Nursing Note and Vital Signs reviewed.  Physical Exam Vitals and nursing note reviewed.  Neck:     Trachea: Phonation normal.  Pulmonary:     Comments: Intermittent wet sounding coughing fits, no audible wheeze, able to speak full and complete sentences, no audible tachypnea or respiratory  distress Neurological:     Mental Status: She is alert.  Psychiatric:        Mood and Affect: Mood and affect normal.     PE limited by telephone encounter  No results found for this or any previous visit (from the past 72 hour(s)).  Assessment and Plan:     ICD-10-CM   1. Acute bronchitis, unspecified organism  J20.9 Albuterol Sulfate (PROAIR RESPICLICK) 108 (90 Base) MCG/ACT AEPB    benzonatate (TESSALON) 100 MG capsule    predniSONE (DELTASONE) 20 MG tablet    azithromycin (ZITHROMAX Z-PAK) 250 MG tablet  Pt with acute bronchitis sx, with hx of emphysema, former smoker, and seasonal allergies, she endorses productive cough with white sputum and wheeze, no CP, SOB, fever, chills, myalgias, sweats. Tx with OTC cough meds, steroid burst/zpak and inhaler Advised though she is vaccinated she may still get and be sx of COVID and she was advised to test and quarantine.  She has not been around anyone with covid and has not been around any sick contacts.  Reviewed ER precautions.  -Red flags and when to present for emergency care or RTC including but not limited to new/worsening/un-resolving symptoms, reviewed with patient at time of visit. Follow up and care instructions discussed and provided in AVS. - I discussed the assessment and treatment plan with the patient. The patient was provided an opportunity to ask questions and all were answered. The patient agreed with the plan and demonstrated an understanding of the instructions.  - The patient was advised to call back or seek an in-person evaluation if the symptoms worsen or if the condition fails to improve as anticipated.  I provided 20+ minutes of non-face-to-face time during this encounter.  Danelle Berry, PA-C 11/26/19 1:03 PM

## 2019-11-27 ENCOUNTER — Other Ambulatory Visit: Payer: Self-pay

## 2019-11-27 DIAGNOSIS — J209 Acute bronchitis, unspecified: Secondary | ICD-10-CM

## 2019-11-27 MED ORDER — ALBUTEROL SULFATE HFA 108 (90 BASE) MCG/ACT IN AERS: 2.0000 | INHALATION_SPRAY | Freq: Four times a day (QID) | RESPIRATORY_TRACT | 0 refills | Status: DC | PRN

## 2019-12-01 ENCOUNTER — Other Ambulatory Visit: Payer: Medicare Other

## 2019-12-04 ENCOUNTER — Telehealth: Payer: Self-pay

## 2019-12-04 NOTE — Telephone Encounter (Signed)
Copied from CRM [id number]. Topic: Referral - Request for Referral >> Dec 04, 2019 11:37 AM Adrian Prince D wrote: Has patient seen PCP for this complaint? Yes.   *If NO, is insurance requiring patient see PCP for this issue before PCP can refer them? Referral for which specialty: Ve[facility] Specialist Preferred provider/office: whichever one you choose Reason for referral: Dr. suggested

## 2019-12-04 NOTE — Telephone Encounter (Signed)
Pt states still having some soreness and swelling in legs

## 2019-12-10 ENCOUNTER — Encounter: Payer: Self-pay | Admitting: Family Medicine

## 2020-01-26 ENCOUNTER — Ambulatory Visit
Admission: RE | Admit: 2020-01-26 | Discharge: 2020-01-26 | Disposition: A | Discharge status: Home / Self Care | Payer: Medicare Other | Source: Ambulatory Visit | Attending: Family Medicine | Admitting: Family Medicine

## 2020-01-26 ENCOUNTER — Other Ambulatory Visit: Payer: Self-pay

## 2020-01-26 DIAGNOSIS — M8589 Other specified disorders of bone density and structure, multiple sites: Secondary | ICD-10-CM

## 2020-01-26 DIAGNOSIS — Z1231 Encounter for screening mammogram for malignant neoplasm of breast: Secondary | ICD-10-CM | POA: Insufficient documentation

## 2020-01-29 ENCOUNTER — Encounter: Payer: Self-pay | Admitting: Family Medicine

## 2020-01-29 ENCOUNTER — Other Ambulatory Visit: Payer: Self-pay

## 2020-01-29 ENCOUNTER — Ambulatory Visit (INDEPENDENT_AMBULATORY_CARE_PROVIDER_SITE_OTHER): Payer: Medicare Other | Admitting: Family Medicine

## 2020-01-29 VITALS — BP 122/82 | HR 66 | Temp 98.1°F | Resp 16 | Ht 67.0 in | Wt 211.8 lb

## 2020-01-29 DIAGNOSIS — E785 Hyperlipidemia, unspecified: Secondary | ICD-10-CM | POA: Diagnosis not present

## 2020-01-29 DIAGNOSIS — I10 Essential (primary) hypertension: Secondary | ICD-10-CM

## 2020-01-29 DIAGNOSIS — I739 Peripheral vascular disease, unspecified: Secondary | ICD-10-CM

## 2020-01-29 DIAGNOSIS — R600 Localized edema: Secondary | ICD-10-CM

## 2020-01-29 DIAGNOSIS — Z5181 Encounter for therapeutic drug level monitoring: Secondary | ICD-10-CM

## 2020-01-29 LAB — BASIC METABOLIC PANEL WITHOUT GFR
BUN/Creatinine Ratio: 18 (calc) (ref 6–22)
BUN: 20 mg/dL (ref 7–25)
CO2: 34 mmol/L — ABNORMAL HIGH (ref 20–32)
Calcium: 10.1 mg/dL (ref 8.6–10.4)
Chloride: 104 mmol/L (ref 98–110)
Creat: 1.09 mg/dL — ABNORMAL HIGH (ref 0.60–0.93)
GFR, Est African American: 58 mL/min/{1.73_m2} — ABNORMAL LOW
GFR, Est Non African American: 50 mL/min/{1.73_m2} — ABNORMAL LOW
Glucose, Bld: 85 mg/dL (ref 65–99)
Potassium: 4.9 mmol/L (ref 3.5–5.3)
Sodium: 144 mmol/L (ref 135–146)

## 2020-01-29 NOTE — Progress Notes (Signed)
Patient ID: Dana Peters, female    DOB: 1946-01-31, 74 y.o.   MRN: [id number]  PCP: Danelle Berry, PA-C  Chief Complaint  Patient presents with   Follow-up    edema, bp, and medication check    Subjective:   Dana Peters is a 74 y.o. female, presents to clinic with CC of the following:  HPI  F/up on LE edema, chronic problem, recently worsening with pedal edema Stopped HCTZ and started lasix prn, with other conservative measures - low salt, compression socks, leg elevation  Hypertension:  hctz stopped Blood pressure today is wellcontrolled. BP Readings from Last 3 Encounters:  01/29/20 122/82  11/21/19 134/82  09/29/19 124/86   Pt denies CP, SOB, exertional sx, LE edema, palpitation, Ha's, visual disturbances, lightheadedness, hypotension, syncope.  Recent Dexa - sig for osteopenia, with hx of vit d deficiency  LE improved a little but then returned and she would like to see a specialist    Patient Active Problem List   Diagnosis Date Noted   Personal history of colonic polyps    Bilateral lower extremity edema 09/27/2017   Emphysema of lung (HCC) 08/14/2017   Ganglion cyst of finger 06/25/2017   Full code status 06/25/2017   Osteoarthritis of knee 06/12/2017   Baker's cyst of knee, right 06/04/2017   Thoracic aortic atherosclerosis (HCC) 08/01/2016   Osteopenia 06/15/2016   Obesity (BMI 30.0-34.9) 05/29/2016   Urinary hesitancy 02/14/2016   PAD (peripheral artery disease) (HCC) 09/09/2015   Coronary artery calcification seen on CT scan 07/20/2015   Personal history of tobacco use, presenting hazards to health 07/14/2015   Foraminal stenosis of lumbar region 01/28/2015   Facet arthropathy, lumbar 01/28/2015   Spinal stenosis of lumbar region 01/28/2015   Neuropathy of both feet 01/07/2015   Leg pain, bilateral 01/07/2015   Leg heaviness 01/07/2015   CKD (chronic kidney disease) stage 2, GFR 60-89 ml/min 10/17/2014    Allergic rhinitis 10/14/2014   Dyslipidemia 10/14/2014   Hypertension    Vitamin D deficiency disease    GERD (gastroesophageal reflux disease) 02/02/2014      Current Outpatient Medications:    aspirin EC 81 MG tablet, Take 1 tablet (81 mg total) by mouth daily., Disp: 30 tablet, Rfl: 11   Cetirizine HCl (ZYRTEC ALLERGY) 10 MG TBDP, Take 10 mg by mouth at bedtime., Disp: 30 tablet, Rfl: 0   Cholecalciferol (VITAMIN D) 2000 units CAPS, Take by mouth daily., Disp: , Rfl:    DULoxetine (CYMBALTA) 30 MG capsule, Take 1 capsule (30 mg total) by mouth daily., Disp: 90 capsule, Rfl: 3   ezetimibe (ZETIA) 10 MG tablet, Take 1 tablet (10 mg total) by mouth daily., Disp: 90 tablet, Rfl: 3   loratadine (CLARITIN) 10 MG tablet, Take 1 tablet (10 mg total) by mouth daily as needed., Disp: 90 tablet, Rfl: 1   rosuvastatin (CRESTOR) 5 MG tablet, Take 5 mg po every other day at bedtime for atherosclerotic disease, Disp: 45 tablet, Rfl: 3   albuterol (VENTOLIN HFA) 108 (90 Base) MCG/ACT inhaler, Inhale 2 puffs into the lungs every 6 (six) hours as needed for wheezing or shortness of breath. (Patient not taking: Reported on 01/29/2020), Disp: 8 g, Rfl: 0   azithromycin (ZITHROMAX Z-PAK) 250 MG tablet, Take 1 tablet (250 mg total) by mouth daily. 500mg  PO day 1, then 250mg  PO days 205 (Patient not taking: Reported on 01/29/2020), Disp: 6 tablet, Rfl: 0   benzonatate (TESSALON) 100 MG capsule, Take 1  capsule (100 mg total) by mouth 3 (three) times daily as needed for cough. (Patient not taking: Reported on 01/29/2020), Disp: 30 capsule, Rfl: 0   fluticasone (FLONASE) 50 MCG/ACT nasal spray, Place 2 sprays into both nostrils daily. (Patient not taking: Reported on 01/29/2020), Disp: 16 g, Rfl: 6   furosemide (LASIX) 20 MG tablet, Take 0.5-1 tablets (10-20 mg total) by mouth daily as needed. In early morning only for severe foot or LE edema, take for 1-2 days at a time only (Patient not taking:  Reported on 01/29/2020), Disp: 20 tablet, Rfl: 0   potassium chloride SA (KLOR-CON) 20 MEQ tablet, Take daily prn when taking lasix/furosemide (Patient not taking: Reported on 01/29/2020), Disp: 20 tablet, Rfl: 0   predniSONE (DELTASONE) 20 MG tablet, 2 tabs poqday 1-3, 1 tabs poqday 4-6 (Patient not taking: Reported on 01/29/2020), Disp: 9 tablet, Rfl: 0   traMADol (ULTRAM) 50 MG tablet, Take 1 tablet (50 mg total) by mouth every 8 (eight) hours as needed. (Patient not taking: Reported on 01/29/2020), Disp: 10 tablet, Rfl: 0   Allergies  Allergen Reactions   Atorvastatin Itching   Codeine Sulfate Itching   Pravastatin Nausea Only   Protonix [Pantoprazole Sodium] Other (See Comments)    Legs felt heavy   Tylenol With Codeine #3  [Acetaminophen-Codeine] Itching   Penicillin G Rash     Social History   Tobacco Use   Smoking status: Former Smoker    Packs/day: 1.00    Years: 30.00    Pack years: 30.00    Types: Cigarettes    Quit date: 10/13/2005    Years since quitting: 14.3   Smokeless tobacco: Former Neurosurgeon    Types: Snuff    Quit date: 10/09/2011  Vaping Use   Vaping Use: Never used  Substance Use Topics   Alcohol use: No    Alcohol/week: 0.0 standard drinks   Drug use: No      Chart Review Today: I personally reviewed active problem list, medication list, allergies, family history, social history, health maintenance, notes from last encounter, lab results, imaging with the patient/caregiver today.   Review of Systems 10 Systems reviewed and are negative for acute change except as noted in the HPI.     Objective:   Vitals:   01/29/20 1000  BP: 122/82  Pulse: 66  Resp: 16  Temp: 98.1 F (36.7 C)  TempSrc: Oral  SpO2: 94%  Weight: 211 lb 12.8 oz (96.1 kg)  Height: 5\' 7"  (1.702 m)    Body mass index is 33.17 kg/m.  Physical Exam Vitals and nursing note reviewed.  Constitutional:      General: She is not in acute distress.    Appearance: She  is obese. She is not ill-appearing, toxic-appearing or diaphoretic.  HENT:     Head: Normocephalic and atraumatic.     Right Ear: External ear normal.     Left Ear: External ear normal.  Eyes:     General: No scleral icterus.       Right eye: Discharge present.        Left eye: No discharge.  Cardiovascular:     Rate and Rhythm: Normal rate and regular rhythm.     Pulses: Normal pulses.     Heart sounds: Normal heart sounds.  Pulmonary:     Effort: Pulmonary effort is normal.     Breath sounds: Normal breath sounds.  Abdominal:     General: Bowel sounds are normal.  Musculoskeletal:  Right lower leg: Edema present.     Left lower leg: Edema present.  Skin:    General: Skin is warm and dry.     Coloration: Skin is not jaundiced or pale.  Neurological:     Mental Status: She is alert.  Psychiatric:        Mood and Affect: Mood normal.        Behavior: Behavior normal.      Results for orders placed or performed in visit on 10/01/19  Hemoglobin A1c  Result Value Ref Range   Hemoglobin A1C 5.6        Assessment & Plan:   1. Pedal edema Conservative measures, HCTZ stopped and pt started lasix, recheck renal function and electrolytes Edema not significantly improved - BASIC METABOLIC PANEL WITH GFR  2. Dyslipidemia Reviewed meds and recent labs Continue zetia daily  crestor low dose and every other day dosing to see if she tolerates  3. Essential hypertension BP well controlled today - BASIC METABOLIC PANEL WITH GFR  4. Bilateral lower extremity edema See above  - BASIC METABOLIC PANEL WITH GFR - Ambulatory referral to Vascular Surgery  5. Encounter for medication monitoring  - BASIC METABOLIC PANEL WITH GFR  6. PAD (peripheral artery disease) (HCC) Lost to f/up on PAD in 2019  Last ov 09/27/2017 with [facility], scheduled for f/up ABI, not completed, no change in sx, just to reest care - lost to f/up - Ambulatory referral to Vascular Surgery  If able  to get back into vascular specialits could eval LE venous disease (reflux? Valve incompetence) and do ABI f/up after being lost to f/up 2 years ago  Encouraged pt to use lasix PRN, continue low salt diet, work on avoiding stasis, wear compression stockings/socks     Danelle Berry, PA-C 01/29/20 10:18 AM

## 2020-01-29 NOTE — Patient Instructions (Signed)
Decrease use of lasix to only when swelling is bothersome and lasts for several days. Try to not use every other day  Always take the potassium supplement when you take the lasix

## 2020-02-06 ENCOUNTER — Telehealth: Payer: Self-pay | Admitting: Family Medicine

## 2020-02-06 NOTE — Telephone Encounter (Signed)
Patient is calling to go over her lab results. CB- [phone]

## 2020-02-16 ENCOUNTER — Ambulatory Visit (INDEPENDENT_AMBULATORY_CARE_PROVIDER_SITE_OTHER): Payer: Medicare HMO | Admitting: Vascular Surgery

## 2020-02-20 ENCOUNTER — Ambulatory Visit: Payer: Medicare Other | Admitting: Family Medicine

## 2020-02-25 NOTE — Progress Notes (Signed)
MRN : [id number]  Dana Peters is a 74 y.o. (09-07-1945) female who presents with chief complaint of No chief complaint on file. Marland Kitchen  History of Present Illness:   Patient is seen for evaluation of leg pain and leg swelling. The patient first noticed the swelling remotely for which she was evaluated a few years back. The swelling is associated with mild discomfort and some discoloration. The pain and swelling worsens with prolonged dependency and improves with elevation. The pain is unrelated to activity.  The patient notes that in the morning the legs are significantly improved but they steadily worsened throughout the course of the day.  On a good note the patient states that this past summer has been quite good she has not had nearly as much edema on a routine basis.  She has been wearing knee-high compression socks.  The patient denies claudication symptoms.  The patient denies symptoms consistent with rest pain.  The patient denies and extensive history of DJD and LS spine disease.  The patient has no had any past angiography, interventions or vascular surgery.  Elevation makes the leg symptoms better, dependency makes them much worse. There is no history of ulcerations. The patient denies any recent changes in medications.  The patient has been wearing graduated compression.  The patient denies a history of DVT or PE. There is no prior history of phlebitis. There is no history of primary lymphedema.  No history of malignancies. No history of trauma or groin or pelvic surgery. There is no history of radiation treatment to the groin or pelvis  The patient denies amaurosis fugax or recent TIA symptoms. There are no recent neurological changes noted. The patient denies recent episodes of angina or shortness of breath  Previous duplex ultrasound is reviewed with the patient.  It was a normal study.  No outpatient medications have been marked as taking for the 02/26/20 encounter  (Appointment) with Gilda Crease, Latina Craver, MD.    Past Medical History:  Diagnosis Date  . Allergy   . Baker's cyst of knee, right 06/04/2017   [name] Feb 2019  . CKD (chronic kidney disease) stage 2, GFR 60-89 ml/min   . Dyslipidemia   . GERD (gastroesophageal reflux disease)   . Hypertension   . Lumbar spinal stenosis   . Neuralgia neuritis, sciatic nerve 08/28/2012  . Neuropathy    both feet  . Obesity (BMI 30.0-34.9) 05/29/2016  . Osteopenia 06/15/2016   March 2018  . Personal history of tobacco use, presenting hazards to health 07/14/2015  . Reflux   . Sciatica   . Thoracic aortic atherosclerosis (HCC) 08/01/2016   Chest CT  . Venous stasis   . Vitamin D deficiency disease   . Wears dentures    Full upper, partial lower    Past Surgical History:  Procedure Laterality Date  . ABDOMINAL HYSTERECTOMY     complete, due to heavy  . CATARACT EXTRACTION, BILATERAL    . COLONOSCOPY WITH PROPOFOL N/A 08/29/2019   Procedure: COLONOSCOPY WITH PROPOFOL;  Surgeon: Midge Minium, MD;  Location: [facility];  Service: Endoscopy;  Laterality: N/A;  priority 4  . ECTOPIC PREGNANCY SURGERY      Social History Social History   Tobacco Use  . Smoking status: Former Smoker    Packs/day: 1.00    Years: 30.00    Pack years: 30.00    Types: Cigarettes    Quit date: 10/13/2005    Years since quitting: 14.3  . Smokeless  tobacco: Former Neurosurgeon    Types: Snuff    Quit date: 10/09/2011  Vaping Use  . Vaping Use: Never used  Substance Use Topics  . Alcohol use: No    Alcohol/week: 0.0 standard drinks  . Drug use: No    Family History Family History  Problem Relation Age of Onset  . Cancer Mother        colon  . Hypertension Mother   . Alzheimer's disease Mother   . Cancer Father        prostate  . Cancer Brother        lung  . Stroke Brother   . Cancer Brother        prostate and brain  . Cancer Brother        throat  . Diabetes Paternal Aunt   . Breast cancer Paternal Aunt   .  Healthy Daughter   . Cirrhosis Brother   No family history of bleeding/clotting disorders, porphyria or autoimmune disease   Allergies  Allergen Reactions  . Atorvastatin Itching  . Codeine Sulfate Itching  . Pravastatin Nausea Only  . Protonix [Pantoprazole Sodium] Other (See Comments)    Legs felt heavy  . Tylenol With Codeine #3  [Acetaminophen-Codeine] Itching  . Penicillin G Rash     REVIEW OF SYSTEMS (Negative unless checked)  Constitutional: [] Weight loss  [] Fever  [] Chills Cardiac: [] Chest pain   [] Chest pressure   [] Palpitations   [] Shortness of breath when laying flat   [] Shortness of breath with exertion. Vascular:  [] Pain in legs with walking   [x] Pain in legs at rest  [] History of DVT   [] Phlebitis   [x] Swelling in legs   [] Varicose veins   [] Non-healing ulcers Pulmonary:   [] Uses home oxygen   [] Productive cough   [] Hemoptysis   [] Wheeze  [] COPD   [] Asthma Neurologic:  [] Dizziness   [] Seizures   [] History of stroke   [] History of TIA  [] Aphasia   [] Vissual changes   [] Weakness or numbness in arm   [] Weakness or numbness in leg Musculoskeletal:   [] Joint swelling   [x] Joint pain   [x] Low back pain Hematologic:  [] Easy bruising  [] Easy bleeding   [] Hypercoagulable state   [] Anemic Gastrointestinal:  [] Diarrhea   [] Vomiting  [x] Gastroesophageal reflux/heartburn   [] Difficulty swallowing. Genitourinary:  [] Chronic kidney disease   [] Difficult urination  [] Frequent urination   [] Blood in urine Skin:  [] Rashes   [] Ulcers  Psychological:  [] History of anxiety   []  History of major depression.  Physical Examination  There were no vitals filed for this visit. There is no height or weight on file to calculate BMI. Gen: WD/WN, NAD Head: [place]/AT, No temporalis wasting.  Ear/Nose/Throat: Hearing grossly intact, nares w/o erythema or drainage, poor dentition Eyes: PER, EOMI, sclera nonicteric.  Neck: Supple, no masses.  No bruit or JVD.  Pulmonary:  Good air movement, clear to  auscultation bilaterally, no use of accessory muscles.  Cardiac: RRR, normal S1, S2, no Murmurs. Vascular: scattered small varicosities present bilaterally.  Mild venous stasis changes to the legs bilaterally.  Trace soft pitting edema Vessel Right Left  Radial Palpable Palpable  PT Palpable Palpable  DP Palpable Palpable  Gastrointestinal: soft, non-distended. No guarding/no peritoneal signs.  Musculoskeletal: M/S 5/5 throughout.  No deformity or atrophy.  Neurologic: CN 2-12 intact. Pain and light touch intact in extremities.  Symmetrical.  Speech is fluent. Motor exam as listed above. Psychiatric: Judgment intact, Mood & affect appropriate for pt's clinical situation. Dermatologic: Very  mild venous rashes no ulcers noted.  No changes consistent with cellulitis. Lymph : No lichenification or skin changes of chronic lymphedema.  CBC Lab Results  Component Value Date   WBC 8.1 09/29/2019   HGB 13.7 09/29/2019   HCT 40.2 09/29/2019   MCV 95.0 09/29/2019   PLT 172 09/29/2019    BMET    Component Value Date/Time   NA 144 01/29/2020 1048   NA 142 05/07/2015 1131   K 4.9 01/29/2020 1048   CL 104 01/29/2020 1048   CO2 34 (H) 01/29/2020 1048   GLUCOSE 85 01/29/2020 1048   BUN 20 01/29/2020 1048   BUN 12 05/07/2015 1131   CREATININE 1.09 (H) 01/29/2020 1048   CALCIUM 10.1 01/29/2020 1048   GFRNONAA 50 (L) 01/29/2020 1048   GFRAA 58 (L) 01/29/2020 1048   CrCl cannot be calculated (Patient's most recent lab result is older than the maximum 21 days allowed.).  COAG No results found for: INR, PROTIME  Radiology No results found.   Assessment/Plan 1. Lymphedema  No surgery or intervention at this point in time.    I have reviewed my previous discussion with the patient regarding swelling and why it  causes symptoms.  The patient is doing well with compression and will continue wearing graduated compression stockings class 1 (20-30 mmHg) on a daily basis a prescription was  given. The patient will  continue wearing the stockings first thing in the morning and removing them in the evening. The patient is instructed specifically not to sleep in the stockings.    In addition, behavioral modification including elevation during the day and exercise will be continued.    Patient should follow-up on an annual basis   2. Coronary artery calcification seen on CT scan Continue cardiac and antihypertensive medications as already ordered and reviewed, no changes at this time.  Continue statin as ordered and reviewed, no changes at this time  Nitrates PRN for chest pain   3. Primary hypertension Continue antihypertensive medications as already ordered, these medications have been reviewed and there are no changes at this time.   4. Pulmonary emphysema, unspecified emphysema type (HCC) Continue pulmonary medications and aerosols as already ordered, these medications have been reviewed and there are no changes at this time.    5. Dyslipidemia Continue statin as ordered and reviewed, no changes at this time     Levora Dredge, MD  02/25/2020 11:06 AM

## 2020-02-26 ENCOUNTER — Other Ambulatory Visit: Payer: Self-pay

## 2020-02-26 ENCOUNTER — Encounter (INDEPENDENT_AMBULATORY_CARE_PROVIDER_SITE_OTHER): Payer: Self-pay | Admitting: Vascular Surgery

## 2020-02-26 ENCOUNTER — Ambulatory Visit (INDEPENDENT_AMBULATORY_CARE_PROVIDER_SITE_OTHER): Payer: Medicare Other | Admitting: Vascular Surgery

## 2020-02-26 VITALS — BP 154/78 | HR 78 | Ht 68.0 in | Wt 212.0 lb

## 2020-02-26 DIAGNOSIS — I739 Peripheral vascular disease, unspecified: Secondary | ICD-10-CM

## 2020-02-26 DIAGNOSIS — I890 Lymphedema, not elsewhere classified: Secondary | ICD-10-CM | POA: Insufficient documentation

## 2020-02-26 DIAGNOSIS — I10 Essential (primary) hypertension: Secondary | ICD-10-CM

## 2020-02-26 DIAGNOSIS — E785 Hyperlipidemia, unspecified: Secondary | ICD-10-CM

## 2020-02-26 DIAGNOSIS — J439 Emphysema, unspecified: Secondary | ICD-10-CM | POA: Diagnosis not present

## 2020-02-26 DIAGNOSIS — I2510 Atherosclerotic heart disease of native coronary artery without angina pectoris: Secondary | ICD-10-CM | POA: Diagnosis not present

## 2020-04-23 ENCOUNTER — Telehealth: Payer: Self-pay | Admitting: Family Medicine

## 2020-04-23 NOTE — Telephone Encounter (Signed)
Pt called in to follow up on request to have a different medication sent in. Pt says that she would like a call back.    CB: [phone]  Pharmacy:  [facility] [address] ([address]), [place] - [address] Phone:  [phone]  Fax:  [phone]

## 2020-04-23 NOTE — Telephone Encounter (Signed)
Pt called and stated that th Rx for ezetimibe (ZETIA) 10 MG tablet Was $100 for a 30 day supply/ Pt stated she was advised by Sheliah Mends about two other meds Simvastatin 10mg  or trazastatin 10mg  with 90 day supply has no copay / Pt has enough Ezetimibe to last until Monday but would like one of the other meds above called in since the Ezetimibe cost is now so high/ please advise

## 2020-04-27 NOTE — Telephone Encounter (Signed)
Need something in place of Zetia

## 2020-04-27 NOTE — Telephone Encounter (Signed)
She was asked to come in to discuss her med SE and review possible med alternatives - I would prefer to do this with encounter if she cannot afford zetia then we need to discuss other statins and do f/up labs etc.  Please let her know we'll review at her upcoming appt - she is welcome to make a sooner appt to discuss med SE/changes

## 2020-04-28 NOTE — Telephone Encounter (Signed)
lvm letting pt know that this will be discussed at upcoming appt next week

## 2020-05-03 ENCOUNTER — Ambulatory Visit: Payer: Medicare Other | Admitting: Family Medicine

## 2020-05-17 ENCOUNTER — Ambulatory Visit (INDEPENDENT_AMBULATORY_CARE_PROVIDER_SITE_OTHER): Payer: Medicare HMO | Admitting: Family Medicine

## 2020-05-17 ENCOUNTER — Encounter: Payer: Self-pay | Admitting: Family Medicine

## 2020-05-17 ENCOUNTER — Other Ambulatory Visit: Payer: Self-pay

## 2020-05-17 VITALS — BP 124/82 | HR 87 | Temp 98.7°F | Resp 18 | Ht 68.0 in | Wt 206.9 lb

## 2020-05-17 DIAGNOSIS — E785 Hyperlipidemia, unspecified: Secondary | ICD-10-CM | POA: Diagnosis not present

## 2020-05-17 DIAGNOSIS — J439 Emphysema, unspecified: Secondary | ICD-10-CM | POA: Diagnosis not present

## 2020-05-17 DIAGNOSIS — R600 Localized edema: Secondary | ICD-10-CM | POA: Diagnosis not present

## 2020-05-17 DIAGNOSIS — I10 Essential (primary) hypertension: Secondary | ICD-10-CM | POA: Diagnosis not present

## 2020-05-17 DIAGNOSIS — Z5181 Encounter for therapeutic drug level monitoring: Secondary | ICD-10-CM

## 2020-05-17 DIAGNOSIS — J301 Allergic rhinitis due to pollen: Secondary | ICD-10-CM

## 2020-05-17 DIAGNOSIS — E66811 Obesity, class 1: Secondary | ICD-10-CM

## 2020-05-17 DIAGNOSIS — M8589 Other specified disorders of bone density and structure, multiple sites: Secondary | ICD-10-CM

## 2020-05-17 DIAGNOSIS — E669 Obesity, unspecified: Secondary | ICD-10-CM

## 2020-05-17 DIAGNOSIS — I739 Peripheral vascular disease, unspecified: Secondary | ICD-10-CM

## 2020-05-17 DIAGNOSIS — I700 Atherosclerosis of aorta: Secondary | ICD-10-CM

## 2020-05-17 MED ORDER — LORATADINE 10 MG PO TABS: 10.0000 mg | ORAL_TABLET | Freq: Every day | ORAL | 3 refills | Status: DC | PRN

## 2020-05-17 NOTE — Progress Notes (Signed)
Name: Dana Peters   MRN: [id number]    DOB: 11/17/1945   Date:05/17/2020       Progress Note  Chief Complaint  Patient presents with  . Hyperlipidemia     Subjective:   Dana Peters is a 75 y.o. female, presents to clinic for routine f/up on chronic conditions   HLD on crestor 5 mg every other day and zetia, tolerating, hx of PAD, no claudication, CP, SOB  Allergies/COPD - needs refill on claritin, not taking flonase, not using inhalers and states breathing is fine  Osteopenia - on supplements   Current Outpatient Medications:  .  aspirin EC 81 MG tablet, Take 1 tablet (81 mg total) by mouth daily., Disp: 30 tablet, Rfl: 11 .  Cholecalciferol (VITAMIN D) 2000 units CAPS, Take by mouth daily., Disp: , Rfl:  .  DULoxetine (CYMBALTA) 30 MG capsule, Take 1 capsule (30 mg total) by mouth daily., Disp: 90 capsule, Rfl: 3 .  ezetimibe (ZETIA) 10 MG tablet, Take 1 tablet (10 mg total) by mouth daily., Disp: 90 tablet, Rfl: 3 .  rosuvastatin (CRESTOR) 5 MG tablet, Take 5 mg po every other day at bedtime for atherosclerotic disease, Disp: 45 tablet, Rfl: 3 .  albuterol (VENTOLIN HFA) 108 (90 Base) MCG/ACT inhaler, Inhale 2 puffs into the lungs every 6 (six) hours as needed for wheezing or shortness of breath. (Patient not taking: No sig reported), Disp: 8 g, Rfl: 0 .  fluticasone (FLONASE) 50 MCG/ACT nasal spray, Place 2 sprays into both nostrils daily. (Patient not taking: No sig reported), Disp: 16 g, Rfl: 6 .  loratadine (CLARITIN) 10 MG tablet, Take 1 tablet (10 mg total) by mouth daily as needed., Disp: 90 tablet, Rfl: 3  Patient Active Problem List   Diagnosis Date Noted  . Lymphedema 02/26/2020  . Personal history of colonic polyps   . Bilateral lower extremity edema 09/27/2017  . Emphysema of lung (HCC) 08/14/2017  . Ganglion cyst of finger 06/25/2017  . Full code status 06/25/2017  . Osteoarthritis of knee 06/12/2017  . Baker's cyst of knee, right 06/04/2017  .  Thoracic aortic atherosclerosis (HCC) 08/01/2016  . Osteopenia 06/15/2016  . Obesity (BMI 30.0-34.9) 05/29/2016  . Urinary hesitancy 02/14/2016  . PAD (peripheral artery disease) (HCC) 09/09/2015  . Coronary artery calcification seen on CT scan 07/20/2015  . Personal history of tobacco use, presenting hazards to health 07/14/2015  . Foraminal stenosis of lumbar region 01/28/2015  . Facet arthropathy, lumbar 01/28/2015  . Spinal stenosis of lumbar region 01/28/2015  . Neuropathy of both feet 01/07/2015  . Leg pain, bilateral 01/07/2015  . Leg heaviness 01/07/2015  . CKD (chronic kidney disease) stage 2, GFR 60-89 ml/min 10/17/2014  . Allergic rhinitis 10/14/2014  . Dyslipidemia 10/14/2014  . Hypertension   . Vitamin D deficiency disease   . GERD (gastroesophageal reflux disease) 02/02/2014    Past Surgical History:  Procedure Laterality Date  . ABDOMINAL HYSTERECTOMY     complete, due to heavy  . CATARACT EXTRACTION, BILATERAL    . COLONOSCOPY WITH PROPOFOL N/A 08/29/2019   Procedure: COLONOSCOPY WITH PROPOFOL;  Surgeon: Midge Minium, MD;  Location: [facility];  Service: Endoscopy;  Laterality: N/A;  priority 4  . ECTOPIC PREGNANCY SURGERY      Family History  Problem Relation Age of Onset  . Cancer Mother        colon  . Hypertension Mother   . Alzheimer's disease Mother   . Cancer Father  prostate  . Cancer Brother        lung  . Stroke Brother   . Cancer Brother        prostate and brain  . Cancer Brother        throat  . Diabetes Paternal Aunt   . Breast cancer Paternal Aunt   . Healthy Daughter   . Cirrhosis Brother     Social History   Tobacco Use  . Smoking status: Former Smoker    Packs/day: 1.00    Years: 30.00    Pack years: 30.00    Types: Cigarettes    Quit date: 10/13/2005    Years since quitting: 14.6  . Smokeless tobacco: Former Neurosurgeon    Types: Snuff    Quit date: 10/09/2011  Vaping Use  . Vaping Use: Never used  Substance Use  Topics  . Alcohol use: No    Alcohol/week: 0.0 standard drinks  . Drug use: No     Allergies  Allergen Reactions  . Atorvastatin Itching  . Codeine Sulfate Itching  . Pravastatin Nausea Only  . Protonix [Pantoprazole Sodium] Other (See Comments)    Legs felt heavy  . Tylenol With Codeine #3  [Acetaminophen-Codeine] Itching  . Penicillin G Rash    Health Maintenance  Topic Date Due  . MAMMOGRAM  01/25/2021  . DEXA SCAN  01/25/2022  . TETANUS/TDAP  01/11/2024  . COLONOSCOPY (Pts 45-32yrs Insurance coverage will need to be confirmed)  08/28/2024  . INFLUENZA VACCINE  Completed  . COVID-19 Vaccine  Completed  . Hepatitis C Screening  Completed  . PNA vac Low Risk Adult  Completed    Chart Review Today: I personally reviewed active problem list, medication list, allergies, family history, social history, health maintenance, notes from last encounter, lab results, imaging with the patient/caregiver today.   Review of Systems  Constitutional: Negative.   HENT: Negative.   Eyes: Negative.   Respiratory: Negative.   Cardiovascular: Negative.   Gastrointestinal: Negative.   Endocrine: Negative.   Genitourinary: Negative.   Musculoskeletal: Negative.   Skin: Negative.   Allergic/Immunologic: Negative.   Neurological: Negative.   Hematological: Negative.   Psychiatric/Behavioral: Negative.   All other systems reviewed and are negative.    Objective:   Vitals:   05/17/20 1045  BP: 124/82  Pulse: 87  Resp: 18  Temp: 98.7 F (37.1 C)  TempSrc: Oral  SpO2: 96%  Weight: 206 lb 14.4 oz (93.8 kg)  Height: 5\' 8"  (1.727 m)    Body mass index is 31.46 kg/m.  Physical Exam Vitals and nursing note reviewed.  Constitutional:      General: She is not in acute distress.    Appearance: Normal appearance. She is well-developed. She is obese. She is not ill-appearing, toxic-appearing or diaphoretic.     Interventions: Face mask in place.  HENT:     Head: Normocephalic  and atraumatic.     Right Ear: External ear normal.     Left Ear: External ear normal.  Eyes:     General: Lids are normal. No scleral icterus.       Right eye: No discharge.        Left eye: No discharge.     Conjunctiva/sclera: Conjunctivae normal.  Neck:     Trachea: Phonation normal. No tracheal deviation.  Cardiovascular:     Rate and Rhythm: Normal rate and regular rhythm.     Pulses: Normal pulses.          Radial  pulses are 2+ on the right side and 2+ on the left side.       Posterior tibial pulses are 2+ on the right side and 2+ on the left side.     Heart sounds: Normal heart sounds. No murmur heard. No friction rub. No gallop.   Pulmonary:     Effort: Pulmonary effort is normal. No respiratory distress.     Breath sounds: Normal breath sounds. No stridor. No wheezing, rhonchi or rales.  Chest:     Chest wall: No tenderness.  Abdominal:     General: Bowel sounds are normal. There is no distension.     Palpations: Abdomen is soft.  Musculoskeletal:     Right lower leg: Edema (mild, non-pitting b/l and symmetrical) present.     Left lower leg: Edema present.  Skin:    General: Skin is warm and dry.     Coloration: Skin is not jaundiced or pale.     Findings: No rash.  Neurological:     Mental Status: She is alert.     Motor: No abnormal muscle tone.     Gait: Gait normal.  Psychiatric:        Mood and Affect: Mood normal.        Speech: Speech normal.        Behavior: Behavior normal.         Assessment & Plan:     ICD-10-CM   1. Pedal edema  R60.0    mild, no sx concerning for CHF/fluid overload, discussed compression stockings, low salt diet, staying active, avoiding stasis  2. Dyslipidemia  E78.5 CBC with Differential/Platelet    COMPLETE METABOLIC PANEL WITH GFR    Lipid panel   on statin and zetia  3. Essential hypertension  I10 CBC with Differential/Platelet    COMPLETE METABOLIC PANEL WITH GFR    Lipid panel   stable, well controlled, BP at  goal today  4. PAD (peripheral artery disease) (HCC)  I73.9 Lipid panel   on statin, no change to baseline sx, no claudication  5. Pulmonary emphysema, unspecified emphysema type (HCC) Chronic J43.9    pt denies sx currently, not using inhalers  6. Obesity (BMI 30.0-34.9)  E66.9    encouraged healthy diet and exercise  7. Osteopenia of multiple sites  M85.89 COMPLETE METABOLIC PANEL WITH GFR   reviewed tx to prevent osteoporosis, review and monitoring labs  8. Thoracic aortic atherosclerosis (HCC)  I70.0 COMPLETE METABOLIC PANEL WITH GFR    Lipid panel   on statin, monitoring  9. Seasonal allergic rhinitis due to pollen  J30.1 loratadine (CLARITIN) 10 MG tablet   rx sent in for antihistamine  10. Encounter for medication monitoring  Z51.81 CBC with Differential/Platelet    COMPLETE METABOLIC PANEL WITH GFR    Lipid panel     Return in about 6 months (around 11/14/2020) for Routine follow-up.   Danelle Berry, PA-C 05/17/20 11:16 AM

## 2020-05-17 NOTE — Patient Instructions (Signed)
Please take the crestor every other day and let us know if you have any problems or side effects.  We do want you to take it as much as you can tolerate because it will lower your risk of heart attacks/strokes or other atherosclerotic cardiovascular events

## 2020-05-18 LAB — CBC WITH DIFFERENTIAL/PLATELET
Absolute Monocytes: 504 {cells}/uL (ref 200–950)
Basophils Absolute: 69 {cells}/uL (ref 0–200)
Basophils Relative: 1.1 %
Eosinophils Absolute: 202 {cells}/uL (ref 15–500)
Eosinophils Relative: 3.2 %
HCT: 40.3 % (ref 35.0–45.0)
Hemoglobin: 13.8 g/dL (ref 11.7–15.5)
Lymphs Abs: 1985 {cells}/uL (ref 850–3900)
MCH: 32.2 pg (ref 27.0–33.0)
MCHC: 34.2 g/dL (ref 32.0–36.0)
MCV: 94.2 fL (ref 80.0–100.0)
MPV: 11.5 fL (ref 7.5–12.5)
Monocytes Relative: 8 %
Neutro Abs: 3541 {cells}/uL (ref 1500–7800)
Neutrophils Relative %: 56.2 %
Platelets: 183 10*3/uL (ref 140–400)
RBC: 4.28 Million/uL (ref 3.80–5.10)
RDW: 12.7 % (ref 11.0–15.0)
Total Lymphocyte: 31.5 %
WBC: 6.3 10*3/uL (ref 3.8–10.8)

## 2020-05-18 LAB — COMPLETE METABOLIC PANEL WITHOUT GFR
AG Ratio: 1.5 (calc) (ref 1.0–2.5)
ALT: 19 U/L (ref 6–29)
AST: 18 U/L (ref 10–35)
Albumin: 4.1 g/dL (ref 3.6–5.1)
Alkaline phosphatase (APISO): 77 U/L (ref 37–153)
BUN/Creatinine Ratio: 13 (calc) (ref 6–22)
BUN: 12 mg/dL (ref 7–25)
CO2: 31 mmol/L (ref 20–32)
Calcium: 9.9 mg/dL (ref 8.6–10.4)
Chloride: 104 mmol/L (ref 98–110)
Creat: 0.94 mg/dL — ABNORMAL HIGH (ref 0.60–0.93)
GFR, Est African American: 69 mL/min/{1.73_m2}
GFR, Est Non African American: 59 mL/min/{1.73_m2} — ABNORMAL LOW
Globulin: 2.8 g/dL (ref 1.9–3.7)
Glucose, Bld: 94 mg/dL (ref 65–99)
Potassium: 4.5 mmol/L (ref 3.5–5.3)
Sodium: 142 mmol/L (ref 135–146)
Total Bilirubin: 0.6 mg/dL (ref 0.2–1.2)
Total Protein: 6.9 g/dL (ref 6.1–8.1)

## 2020-05-18 LAB — LIPID PANEL
Cholesterol: 129 mg/dL
HDL: 53 mg/dL
LDL Cholesterol (Calc): 56 mg/dL
Non-HDL Cholesterol (Calc): 76 mg/dL
Total CHOL/HDL Ratio: 2.4 (calc)
Triglycerides: 118 mg/dL

## 2020-06-18 DIAGNOSIS — M1990 Unspecified osteoarthritis, unspecified site: Secondary | ICD-10-CM | POA: Diagnosis not present

## 2020-06-18 DIAGNOSIS — G629 Polyneuropathy, unspecified: Secondary | ICD-10-CM | POA: Diagnosis not present

## 2020-06-18 DIAGNOSIS — Z809 Family history of malignant neoplasm, unspecified: Secondary | ICD-10-CM | POA: Diagnosis not present

## 2020-06-18 DIAGNOSIS — J439 Emphysema, unspecified: Secondary | ICD-10-CM | POA: Diagnosis not present

## 2020-06-18 DIAGNOSIS — Z833 Family history of diabetes mellitus: Secondary | ICD-10-CM | POA: Diagnosis not present

## 2020-06-18 DIAGNOSIS — E785 Hyperlipidemia, unspecified: Secondary | ICD-10-CM | POA: Diagnosis not present

## 2020-06-18 DIAGNOSIS — K219 Gastro-esophageal reflux disease without esophagitis: Secondary | ICD-10-CM | POA: Diagnosis not present

## 2020-06-18 DIAGNOSIS — Z7982 Long term (current) use of aspirin: Secondary | ICD-10-CM | POA: Diagnosis not present

## 2020-06-18 DIAGNOSIS — J301 Allergic rhinitis due to pollen: Secondary | ICD-10-CM | POA: Diagnosis not present

## 2020-06-18 DIAGNOSIS — I2510 Atherosclerotic heart disease of native coronary artery without angina pectoris: Secondary | ICD-10-CM | POA: Diagnosis not present

## 2020-06-23 ENCOUNTER — Encounter: Payer: Self-pay | Admitting: Family Medicine

## 2020-06-30 DIAGNOSIS — H1045 Other chronic allergic conjunctivitis: Secondary | ICD-10-CM | POA: Diagnosis not present

## 2020-06-30 DIAGNOSIS — H26493 Other secondary cataract, bilateral: Secondary | ICD-10-CM | POA: Diagnosis not present

## 2020-06-30 DIAGNOSIS — Z961 Presence of intraocular lens: Secondary | ICD-10-CM | POA: Diagnosis not present

## 2020-06-30 DIAGNOSIS — H5203 Hypermetropia, bilateral: Secondary | ICD-10-CM | POA: Diagnosis not present

## 2020-07-08 ENCOUNTER — Telehealth: Payer: Self-pay

## 2020-07-08 NOTE — Telephone Encounter (Signed)
Pt called to return call. She states that she has an appt with that doctor on Monday and will follow up with an appt with PCP if she sees fit after. Please advise.

## 2020-07-08 NOTE — Telephone Encounter (Signed)
Copied from CRM [id number]. Topic: General - Inquiry >> Jul 07, 2020  1:42 PM Crist Infante wrote: Reason for CRM: pt states there is an office in [place] that will see pt for her feet. They told her they can check her feet for neuropathy. They advised they can help her w/out medication and run test on her feet.  She wants to know what Sheliah Mends thinks, and if she thinks it may be a hoax. Pt is going to call back with the name of the company. Will route when she calls back.

## 2020-07-08 NOTE — Telephone Encounter (Signed)
Already spoke to patient and told her she would need an appt to discuss

## 2020-07-13 ENCOUNTER — Ambulatory Visit: Payer: Medicare Other

## 2020-07-15 ENCOUNTER — Telehealth: Payer: Self-pay

## 2020-07-15 NOTE — Telephone Encounter (Signed)
Pt notified needs appt

## 2020-07-15 NOTE — Telephone Encounter (Signed)
Copied from CRM [id number]. Topic: General - Other >> Jul 15, 2020 11:42 AM Tamela Oddi wrote: Reason for CRM: Patient called to ask the doctor to call her regarding her neuropathy appt.  She stated the doctor wanted her to let her know when her appt. Is and wanted to talk with her regarding appt.  Patient did not need an appt. With Dr. Angelica Chessman, just to speak and ask a few questions.  Please call to discuss at [phone]

## 2020-07-21 ENCOUNTER — Ambulatory Visit: Payer: Medicare HMO | Admitting: Physician Assistant

## 2020-08-27 ENCOUNTER — Ambulatory Visit: Payer: Self-pay

## 2020-08-27 NOTE — Telephone Encounter (Signed)
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   They did get her a lighter vacuum cleaner.   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   Her fingers and hand are swollen.   No injuries or wounds.   It's tender.  I went over the home care advice with her.   She wants to try the care advice before making an appt.    She also has a break coming up since school will be closed for the [facility] Day holiday.   She will see if between icing it and the rest from work if it gets better if not she will call us back for an appt.    Reason for Disposition . Minor injury or bruising from direct blow    Right hand swollen from pushing a heavy vacuum cleaner and wiping/cleaning mirrors on her job at a school.  Answer Assessment - Initial Assessment Questions 1. MECHANISM: "How did the injury happen?"     My right hand is swollen.   I push a heavy vacuum cleaner on my job.  My left hand is ok.    I now have a lighter vacuum I use on my job.   But my hand is still swollen and tender.    2. ONSET: "When did the injury happen?" (Minutes or hours ago)      Been swollen 3 weeks.   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  3. APPEARANCE of INJURY: "What does the injury look like?"      My fingers are swollen.   School will be out for [facility] Day so that will give my hand a rest. 4. 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 5. SIZE: For cuts, bruises, or swelling, ask: "How large is it?" (e.g., inches or centimeters;  entire hand or wrist)      No injuries.   It's just swollen 6. PAIN: "Is there pain?" If Yes, ask: "How bad is the pain?"  (Scale 1-10; or mild, moderate, severe)     Tender 7. TETANUS: For any breaks in the skin,  ask: "When was the last tetanus booster?"     Not asked 8. OTHER SYMPTOMS: "Do you have any other symptoms?"      I will have a break from cleaning and working on my job during the [employer].  I will see if my hand gets better during that time and also use the ice pack you are recommending to see if that helps the swelling and tenderness. 9. PREGNANCY: "Is there any chance you are pregnant?" "When was your last menstrual period?"     N/A due to age  Protocols used: HAND AND WRIST INJURY-A-AH

## 2020-08-31 ENCOUNTER — Other Ambulatory Visit: Payer: Self-pay

## 2020-08-31 ENCOUNTER — Ambulatory Visit (INDEPENDENT_AMBULATORY_CARE_PROVIDER_SITE_OTHER): Payer: Medicare HMO

## 2020-08-31 VITALS — BP 140/80 | HR 74 | Temp 98.1°F | Resp 16 | Ht 68.0 in | Wt 206.0 lb

## 2020-08-31 DIAGNOSIS — Z0000 Encounter for general adult medical examination without abnormal findings: Secondary | ICD-10-CM

## 2020-08-31 NOTE — Patient Instructions (Signed)
Dana Peters , Thank you for taking time to come for your Medicare Wellness Visit. I appreciate your ongoing commitment to your health goals. Please review the following plan we discussed and let me know if I can assist you in the future.   Screening recommendations/referrals: Colonoscopy: done 08/29/19 Mammogram: done 01/26/20 Bone Density: done 01/26/20 Recommended yearly ophthalmology/optometry visit for glaucoma screening and checkup Recommended yearly dental visit for hygiene and checkup  Vaccinations: Influenza vaccine: done 01/12/20 Pneumococcal vaccine: done 01/10/14 Tdap vaccine: done 01/10/14 Shingles vaccine: Shingrix discussed. Please contact your pharmacy for coverage information.  Covid-19: done 05/06/19, 06/03/19, 01/31/20 & 07/27/20  Advanced directives: Advance directive discussed with you today. I have provided a copy for you to complete at home and have notarized. Once this is complete please bring a copy in to our office so we can scan it into your chart.  Conditions/risks identified: Keep up the great work!  Next appointment: Follow up in one year for your annual wellness visit    Preventive Care 65 Years and Older, Female Preventive care refers to lifestyle choices and visits with your health care provider that can promote health and wellness. What does preventive care include?  A yearly physical exam. This is also called an annual well check.  Dental exams once or twice a year.  Routine eye exams. Ask your health care provider how often you should have your eyes checked.  Personal lifestyle choices, including:  Daily care of your teeth and gums.  Regular physical activity.  Eating a healthy diet.  Avoiding tobacco and drug use.  Limiting alcohol use.  Practicing safe sex.  Taking low-dose aspirin every day.  Taking vitamin and mineral supplements as recommended by your health care provider. What happens during an annual well check? The services and  screenings done by your health care provider during your annual well check will depend on your age, overall health, lifestyle risk factors, and family history of disease. Counseling  Your health care provider may ask you questions about your:  Alcohol use.  Tobacco use.  Drug use.  Emotional well-being.  Home and relationship well-being.  Sexual activity.  Eating habits.  History of falls.  Memory and ability to understand (cognition).  Work and work Astronomer.  Reproductive health. Screening  You may have the following tests or measurements:  Height, weight, and BMI.  Blood pressure.  Lipid and cholesterol levels. These may be checked every 5 years, or more frequently if you are over 7 years old.  Skin check.  Lung cancer screening. You may have this screening every year starting at age 87 if you have a 30-pack-year history of smoking and currently smoke or have quit within the past 15 years.  Fecal occult blood test (FOBT) of the stool. You may have this test every year starting at age 80.  Flexible sigmoidoscopy or colonoscopy. You may have a sigmoidoscopy every 5 years or a colonoscopy every 10 years starting at age 48.  Hepatitis C blood test.  Hepatitis B blood test.  Sexually transmitted disease (STD) testing.  Diabetes screening. This is done by checking your blood sugar (glucose) after you have not eaten for a while (fasting). You may have this done every 1-3 years.  Bone density scan. This is done to screen for osteoporosis. You may have this done starting at age 68.  Mammogram. This may be done every 1-2 years. Talk to your health care provider about how often you should have regular mammograms. Talk with  your health care provider about your test results, treatment options, and if necessary, the need for more tests. Vaccines  Your health care provider may recommend certain vaccines, such as:  Influenza vaccine. This is recommended every  year.  Tetanus, diphtheria, and acellular pertussis (Tdap, Td) vaccine. You may need a Td booster every 10 years.  Zoster vaccine. You may need this after age 4.  Pneumococcal 13-valent conjugate (PCV13) vaccine. One dose is recommended after age 94.  Pneumococcal polysaccharide (PPSV23) vaccine. One dose is recommended after age 1. Talk to your health care provider about which screenings and vaccines you need and how often you need them. This information is not intended to replace advice given to you by your health care provider. Make sure you discuss any questions you have with your health care provider. Document Released: 04/23/2015 Document Revised: 12/15/2015 Document Reviewed: 01/26/2015 Elsevier Interactive Patient Education  2017 [place] Prevention in the Home Falls can cause injuries. They can happen to people of all ages. There are many things you can do to make your home safe and to help prevent falls. What can I do on the outside of my home?  Regularly fix the edges of walkways and driveways and fix any cracks.  Remove anything that might make you trip as you walk through a door, such as a raised step or threshold.  Trim any bushes or trees on the path to your home.  Use bright outdoor lighting.  Clear any walking paths of anything that might make someone trip, such as rocks or tools.  Regularly check to see if handrails are loose or broken. Make sure that both sides of any steps have handrails.  Any raised decks and porches should have guardrails on the edges.  Have any leaves, snow, or ice cleared regularly.  Use sand or salt on walking paths during winter.  Clean up any spills in your garage right away. This includes oil or grease spills. What can I do in the bathroom?  Use night lights.  Install grab bars by the toilet and in the tub and shower. Do not use towel bars as grab bars.  Use non-skid mats or decals in the tub or shower.  If you  need to sit down in the shower, use a plastic, non-slip stool.  Keep the floor dry. Clean up any water that spills on the floor as soon as it happens.  Remove soap buildup in the tub or shower regularly.  Attach bath mats securely with double-sided non-slip rug tape.  Do not have throw rugs and other things on the floor that can make you trip. What can I do in the bedroom?  Use night lights.  Make sure that you have a light by your bed that is easy to reach.  Do not use any sheets or blankets that are too big for your bed. They should not hang down onto the floor.  Have a firm chair that has side arms. You can use this for support while you get dressed.  Do not have throw rugs and other things on the floor that can make you trip. What can I do in the kitchen?  Clean up any spills right away.  Avoid walking on wet floors.  Keep items that you use a lot in easy-to-reach places.  If you need to reach something above you, use a strong step stool that has a grab bar.  Keep electrical cords out of the way.  Do not  use floor polish or wax that makes floors slippery. If you must use wax, use non-skid floor wax.  Do not have throw rugs and other things on the floor that can make you trip. What can I do with my stairs?  Do not leave any items on the stairs.  Make sure that there are handrails on both sides of the stairs and use them. Fix handrails that are broken or loose. Make sure that handrails are as long as the stairways.  Check any carpeting to make sure that it is firmly attached to the stairs. Fix any carpet that is loose or worn.  Avoid having throw rugs at the top or bottom of the stairs. If you do have throw rugs, attach them to the floor with carpet tape.  Make sure that you have a light switch at the top of the stairs and the bottom of the stairs. If you do not have them, ask someone to add them for you. What else can I do to help prevent falls?  Wear shoes  that:  Do not have high heels.  Have rubber bottoms.  Are comfortable and fit you well.  Are closed at the toe. Do not wear sandals.  If you use a stepladder:  Make sure that it is fully opened. Do not climb a closed stepladder.  Make sure that both sides of the stepladder are locked into place.  Ask someone to hold it for you, if possible.  Clearly mark and make sure that you can see:  Any grab bars or handrails.  First and last steps.  Where the edge of each step is.  Use tools that help you move around (mobility aids) if they are needed. These include:  Canes.  Walkers.  Scooters.  Crutches.  Turn on the lights when you go into a dark area. Replace any light bulbs as soon as they burn out.  Set up your furniture so you have a clear path. Avoid moving your furniture around.  If any of your floors are uneven, fix them.  If there are any pets around you, be aware of where they are.  Review your medicines with your doctor. Some medicines can make you feel dizzy. This can increase your chance of falling. Ask your doctor what other things that you can do to help prevent falls. This information is not intended to replace advice given to you by your health care provider. Make sure you discuss any questions you have with your health care provider. Document Released: 01/21/2009 Document Revised: 09/02/2015 Document Reviewed: 05/01/2014 Elsevier Interactive Patient Education  2017 [employer].

## 2020-08-31 NOTE — Progress Notes (Signed)
Subjective:   Dana Peters is a 75 y.o. female who presents for Medicare Annual (Subsequent) preventive examination.  Review of Systems     Cardiac Risk Factors include: advanced age (>75men, >72 women);dyslipidemia;obesity (BMI >30kg/m2);hypertension     Objective:    Today's Vitals   08/31/20 0928  BP: 140/80  Pulse: 74  Resp: 16  Temp: 98.1 F (36.7 C)  TempSrc: Oral  SpO2: 95%  Weight: 206 lb (93.4 kg)  Height: 5\' 8"  (1.727 m)   Body mass index is 31.32 kg/m.  Advanced Directives 08/31/2020 08/29/2019 07/10/2019 06/27/2018 04/28/2018 05/28/2017 01/19/2017  Does Patient Have a Medical Advance Directive? No No No Yes No Yes Yes  Type of Advance Directive - - - Living will;Healthcare Power of Attorney - Living will;Healthcare Power of Attorney -  Does patient want to make changes to medical advance directive? - No - Patient declined - - - - -  Copy of Healthcare Power of Attorney in Chart? - - - No - copy requested - - -  Would patient like information on creating a medical advance directive? Yes (MAU/Ambulatory/Procedural Areas - Information given) - Yes (MAU/Ambulatory/Procedural Areas - Information given) - - - -    Current Medications (verified) Outpatient Encounter Medications as of 08/31/2020  Medication Sig  . aspirin EC 81 MG tablet Take 1 tablet (81 mg total) by mouth daily.  . Cholecalciferol (VITAMIN D) 2000 units CAPS Take by mouth daily.  . DULoxetine (CYMBALTA) 30 MG capsule Take 1 capsule (30 mg total) by mouth daily.  09/02/2020 ezetimibe (ZETIA) 10 MG tablet Take 1 tablet (10 mg total) by mouth daily.  . fluticasone (FLONASE) 50 MCG/ACT nasal spray Place 2 sprays into both nostrils daily.  Marland Kitchen loratadine (CLARITIN) 10 MG tablet Take 1 tablet (10 mg total) by mouth daily as needed.  . rosuvastatin (CRESTOR) 5 MG tablet Take 5 mg po every other day at bedtime for atherosclerotic disease  . albuterol (VENTOLIN HFA) 108 (90 Base) MCG/ACT inhaler Inhale 2 puffs into the  lungs every 6 (six) hours as needed for wheezing or shortness of breath. (Patient not taking: No sig reported)   No facility-administered encounter medications on file as of 08/31/2020.    Allergies (verified) Atorvastatin, Codeine sulfate, Pravastatin, Protonix [pantoprazole sodium], Tylenol with codeine #3  [acetaminophen-codeine], and Penicillin g   History: Past Medical History:  Diagnosis Date  . Allergy   . Baker's cyst of knee, right 06/04/2017   06/06/2017 Feb 2019  . CKD (chronic kidney disease) stage 2, GFR 60-89 ml/min   . Dyslipidemia   . GERD (gastroesophageal reflux disease)   . Hypertension   . Lumbar spinal stenosis   . Neuralgia neuritis, sciatic nerve 08/28/2012  . Neuropathy    both feet  . Obesity (BMI 30.0-34.9) 05/29/2016  . Osteopenia 06/15/2016   March 2018  . Personal history of tobacco use, presenting hazards to health 07/14/2015  . Reflux   . Sciatica   . Thoracic aortic atherosclerosis (HCC) 08/01/2016   Chest CT  . Venous stasis   . Vitamin D deficiency disease   . Wears dentures    Full upper, partial lower   Past Surgical History:  Procedure Laterality Date  . ABDOMINAL HYSTERECTOMY     complete, due to heavy  . CATARACT EXTRACTION, BILATERAL    . COLONOSCOPY WITH PROPOFOL N/A 08/29/2019   Procedure: COLONOSCOPY WITH PROPOFOL;  Surgeon: 08/31/2019, MD;  [facility];  Service: Endoscopy;  Laterality: N/A;  priority 4  . ECTOPIC PREGNANCY SURGERY     Family History  Problem Relation Age of Onset  . Cancer Mother        colon  . Hypertension Mother   . Alzheimer's disease Mother   . Cancer Father        prostate  . Cancer Brother        lung  . Stroke Brother   . Cancer Brother        prostate and brain  . Cancer Brother        throat  . Diabetes Paternal Aunt   . Breast cancer Paternal Aunt   . Healthy Daughter   . Cirrhosis Brother    Social History   Socioeconomic History  . Marital status: Married    Spouse name:  Not on file  . Number of children: 1  . Years of education: Not on file  . Highest education level: 12th grade  Occupational History  . Occupation: retired  Tobacco Use  . Smoking status: Former Smoker    Packs/day: 1.00    Years: 30.00    Pack years: 30.00    Types: Cigarettes    Quit date: 10/13/2005    Years since quitting: 14.8  . Smokeless tobacco: Former Neurosurgeon    Types: Snuff    Quit date: 10/09/2011  Vaping Use  . Vaping Use: Never used  Substance and Sexual Activity  . Alcohol use: No    Alcohol/week: 0.0 standard drinks  . Drug use: No  . Sexual activity: Yes  Other Topics Concern  . Not on file  Social History Narrative   Lives with husband in a one story home.  Has one daughter.     Retired from [employer] work.     Education: high school.   Social Determinants of Health   Financial Resource Strain: Low Risk   . Difficulty of Paying Living Expenses: Not very hard  Food Insecurity: No Food Insecurity  . Worried About Programme researcher, broadcasting/film/video in the Last Year: Never true  . Ran Out of Food in the Last Year: Never true  Transportation Needs: No Transportation Needs  . Lack of Transportation (Medical): No  . Lack of Transportation (Non-Medical): No  Physical Activity: Inactive  . Days of Exercise per Week: 0 days  . Minutes of Exercise per Session: 0 min  Stress: No Stress Concern Present  . Feeling of Stress : Only a little  Social Connections: Moderately Integrated  . Frequency of Communication with Friends and Family: More than three times a week  . Frequency of Social Gatherings with Friends and Family: Three times a week  . Attends Religious Services: More than 4 times per year  . Active Member of Clubs or Organizations: No  . Attends Banker Meetings: Never  . Marital Status: Married    Tobacco Counseling Counseling given: Not Answered   Clinical Intake:  Pre-visit preparation completed: Yes  Pain : No/denies pain     BMI - recorded:  31.32 Nutritional Status: BMI > 30  Obese Nutritional Risks: None Diabetes: No  How often do you need to have someone help you when you read instructions, pamphlets, or other written materials from your doctor or pharmacy?: 1 - Never   Interpreter Needed?: No  Information entered by :: Reather Littler LPN   Activities of Daily Living In your present state of health, do you have any difficulty performing the following activities: 08/31/2020 05/17/2020  Hearing? Malvin Johns  Comment declines hearing aids -  Vision? N N  Difficulty concentrating or making decisions? N Y  Walking or climbing stairs? N N  Dressing or bathing? N N  Doing errands, shopping? N N  Preparing Food and eating ? N -  Using the Toilet? N -  In the past six months, have you accidently leaked urine? N -  Do you have problems with loss of bowel control? N -  Managing your Medications? N -  Managing your Finances? N -  Housekeeping or managing your Housekeeping? N -  Some recent data might be hidden    Patient Care Team: Danelle Berryapia, Leisa, PA-C as PCP - General (Family Medicine) Antonieta IbaGollan, Timothy J, MD as Consulting Physician (Cardiology)  Indicate any recent Medical Services you may have received from other than [facility] providers in the past year (date may be approximate).     Assessment:   This is a routine wellness examination for [place].  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   Vision Screening Comments: [facility]  Dietary issues and exercise activities discussed: Current Exercise Habits: The patient has a physically strenuous job, but has no regular exercise apart from work., Exercise limited by: None identified  Goals Addressed              This Visit's Progress   .  Increase  physical activity (pt-stated)   On track     Depression Screen PHQ 2/9 Scores 08/31/2020 05/17/2020 01/29/2020 11/26/2019 11/21/2019 09/29/2019 07/10/2019  PHQ - 2 Score 1 0 0 0 0 0 0  PHQ- 9 Score - - - 0 0 0 -    Fall Risk Fall Risk  08/31/2020 05/17/2020 01/29/2020 11/26/2019 11/21/2019  Falls in the past year? 0 0 0 0 0  Number falls in past yr: 0 0 0 0 0  Injury with Fall? 0 0 0 0 0  Risk for fall due to : No Fall Risks - - - -  Follow up Falls prevention discussed Falls evaluation completed - Falls evaluation completed Falls evaluation completed    FALL RISK PREVENTION PERTAINING TO THE HOME:  Any stairs in or around the home? Yes  If so, are there any without handrails? Yes  2 steps outside Home free of loose throw rugs in walkways, pet beds, electrical cords, etc? Yes  Adequate lighting in your home to reduce risk of falls? Yes   ASSISTIVE DEVICES UTILIZED TO PREVENT FALLS:  Life alert? No  Use of a cane, walker or w/c? No  Grab bars in the bathroom? No  Shower chair or bench in shower? No  Elevated toilet seat or a handicapped toilet? Yes   TIMED UP AND GO:  Was the test performed? Yes .  Length of time to ambulate 10 feet: 5 sec.   Gait steady and fast without use of assistive device  Cognitive Function:     6CIT Screen 08/31/2020 07/10/2019 06/27/2018 06/25/2017 06/15/2016  What Year? 0 points 0 points 0 points 0 points 0 points  What month? 0 points 0 points 0 points 0 points 0 points  What time? 0 points 0 points 0 points 0 points 0 points  Count back from  20 0 points 0 points 0 points 0 points 0 points  Months in reverse 0 points 2 points 0 points 2 points 0 points  Repeat phrase 0 points 2 points 2 points 0 points 2 points  Total Score 0 Immunizations Immunization History  Administered Date(s) Administered  . Fluad Quad(high Dose 65+) 12/03/2018, 01/12/2020  . Influenza, High Dose Seasonal PF 01/19/2017  . Influenza, Quadrivalent, Recombinant, Inj, Pf  01/24/2018  . Influenza-Unspecified 03/18/2014, 01/05/2015, 01/03/2016  . Moderna Sars-Covid-2 Vaccination 05/06/2019, 06/03/2019, 01/31/2020, 07/27/2020  . Pneumococcal Conjugate-13 11/18/2013  . Pneumococcal Polysaccharide-23 05/15/2012, 01/10/2014  . Td 01/10/2014  . Tdap 11/22/2011  . Zoster 01/17/2012, 05/22/2014    TDAP status: Up to date  Flu Vaccine status: Up to date  Pneumococcal vaccine status: Up to date  Covid-19 vaccine status: Completed vaccines  Qualifies for Shingles Vaccine? Yes   Zostavax completed Yes   Shingrix Completed?: No.    Education has been provided regarding the importance of this vaccine. Patient has been advised to call insurance company to determine out of pocket expense if they have not yet received this vaccine. Advised may also receive vaccine at local pharmacy or Health Dept. Verbalized acceptance and understanding.  Screening Tests Health Maintenance  Topic Date Due  . INFLUENZA VACCINE  11/08/2020  . MAMMOGRAM  01/25/2021  . DEXA SCAN  01/25/2022  . TETANUS/TDAP  01/11/2024  . COLONOSCOPY (Pts 45-44yrs Insurance coverage will need to be confirmed)  08/28/2024  . COVID-19 Vaccine  Completed  . Hepatitis C Screening  Completed  . PNA vac Low Risk Adult  Completed  . HPV VACCINES  Aged Out    Health Maintenance  There are no preventive care reminders to display for this patient.  Colorectal cancer screening: Type of screening: Colonoscopy. Completed 08/29/19. Repeat every 5 years  Mammogram status: Completed 01/26/20. Repeat every year  Bone Density status: Completed 01/26/20. Results reflect: Bone density results: OSTEOPENIA. Repeat every 2 years.  Lung Cancer Screening: (Low Dose CT Chest recommended if Age 66-80 years, 30 pack-year currently smoking OR have quit w/in 15years.) does qualify. Completed 10/24/19.  Additional Screening:  Hepatitis C Screening: does qualify; Completed 05/07/15  Vision Screening: Recommended annual  ophthalmology exams for early detection of glaucoma and other disorders of the eye. Is the patient up to date with their annual eye exam?  Yes  Who is the provider or what is the name of the office in which the patient attends annual eye exams? [facility].   Dental Screening: Recommended annual dental exams for proper oral hygiene  Community Resource Referral / Chronic Care Management: CRR required this visit?  No   CCM required this visit?  No      Plan:     I have personally reviewed and noted the following in the patient's chart:   . Medical and social history . Use of alcohol, tobacco or illicit drugs  . Current medications and supplements including opioid prescriptions.  . Functional ability and status . Nutritional status . Physical activity . Advanced directives . List of other physicians . Hospitalizations, surgeries, and ER visits in previous 12 months . Vitals . Screenings to include cognitive, depression, and falls . Referrals and appointments  In addition, I have reviewed and discussed with patient certain preventive protocols, quality metrics, and best practice recommendations. A written personalized care plan for preventive services as well as general preventive health recommendations were provided to patient.  Reather Littler, LPN   12/09/5174   Nurse Notes: none

## 2020-10-22 ENCOUNTER — Encounter: Payer: Self-pay | Admitting: Family Medicine

## 2020-10-22 ENCOUNTER — Other Ambulatory Visit: Payer: Self-pay

## 2020-10-22 ENCOUNTER — Ambulatory Visit (INDEPENDENT_AMBULATORY_CARE_PROVIDER_SITE_OTHER): Payer: Medicare HMO | Admitting: Family Medicine

## 2020-10-22 VITALS — BP 124/78 | HR 80 | Temp 98.2°F | Resp 16 | Ht 64.0 in | Wt 203.7 lb

## 2020-10-22 DIAGNOSIS — Z760 Encounter for issue of repeat prescription: Secondary | ICD-10-CM | POA: Diagnosis not present

## 2020-10-22 DIAGNOSIS — I2510 Atherosclerotic heart disease of native coronary artery without angina pectoris: Secondary | ICD-10-CM

## 2020-10-22 DIAGNOSIS — I700 Atherosclerosis of aorta: Secondary | ICD-10-CM

## 2020-10-22 DIAGNOSIS — I739 Peripheral vascular disease, unspecified: Secondary | ICD-10-CM

## 2020-10-22 DIAGNOSIS — M79641 Pain in right hand: Secondary | ICD-10-CM | POA: Diagnosis not present

## 2020-10-22 DIAGNOSIS — M25531 Pain in right wrist: Secondary | ICD-10-CM | POA: Diagnosis not present

## 2020-10-22 DIAGNOSIS — E785 Hyperlipidemia, unspecified: Secondary | ICD-10-CM | POA: Diagnosis not present

## 2020-10-22 MED ORDER — DULOXETINE HCL 30 MG PO CPEP: 30.0000 mg | ORAL_CAPSULE | Freq: Every day | ORAL | 3 refills | Status: DC

## 2020-10-22 MED ORDER — MELOXICAM 15 MG PO TABS: 15.0000 mg | ORAL_TABLET | Freq: Every day | ORAL | 0 refills | Status: DC | PRN

## 2020-10-22 MED ORDER — EZETIMIBE 10 MG PO TABS: 10.0000 mg | ORAL_TABLET | Freq: Every day | ORAL | 3 refills | Status: DC

## 2020-10-22 MED ORDER — ROSUVASTATIN CALCIUM 5 MG PO TABS: ORAL_TABLET | ORAL | 3 refills | Status: DC

## 2020-10-22 NOTE — Patient Instructions (Signed)
wrist cock up splint - right [employer] or [employer] 12-20$  Try mobic/meloxicam once a day and tylenol arthritis Avoid strenuous activity heavy lifting or repetitive activity  If not improvement in a month left me know and we'll have a specialists see you   Carpal Tunnel Syndrome  Carpal tunnel syndrome is a condition that causes pain, weakness, and numbness in your hand and arm. Numbness is when you cannot feel an area in your body. The carpal tunnel is a narrow area that is on the palm side of your wrist. Repeated wrist motion or certain diseases may cause swelling in the tunnel. This swelling can pinch the main nerve in the wrist. This nerve is called themedian nerve. What are the causes? This condition may be caused by: Moving your hand and wrist over and over again while doing a task. Injury to the wrist. Arthritis. A sac of fluid (cyst) or abnormal growth (tumor) in the carpal tunnel. Fluid buildup during pregnancy. Use of tools that vibrate. Sometimes the cause is not known. What increases the risk? The following factors may make you more likely to have this condition: Having a job that makes you do these things: Move your hand over and over again. Work with tools that vibrate, such as drills or sanders. Being a woman. Having diabetes, obesity, thyroid problems, or kidney failure. What are the signs or symptoms? Symptoms of this condition include: A tingling feeling in your fingers. Tingling or loss of feeling in your hand. Pain in your entire arm. This pain may get worse when you bend your wrist and elbow for a long time. Pain in your wrist that goes up your arm to your shoulder. Pain that goes down into your palm or fingers. Weakness in your hands. You may find it hard to grab and hold items. You may feel worse at night. How is this treated? This condition may be treated with: Lifestyle changes. You will be asked to stop or change the activity that caused your problem. Doing  exercises and activities that make bones, muscles, and tendons stronger (physical therapy). Learning how to use your hand again (occupational therapy). Medicines for pain and swelling. You may have injections in your wrist. A wrist splint or brace. Surgery. Follow these instructions at home: If you have a splint or brace: Wear the splint or brace as told by your doctor. Take it off only as told by your doctor. Loosen the splint if your fingers: Tingle. Become numb. Turn cold and blue. Keep the splint or brace clean. If the splint or brace is not waterproof: Do not let it get wet. Cover it with a watertight covering when you take a bath or a shower. Managing pain, stiffness, and swelling If told, put ice on the painful area: If you have a removable splint or brace, remove it as told by your doctor. Put ice in a plastic bag. Place a towel between your skin and the bag. Leave the ice on for 20 minutes, 2-3 times per day. Do not fall asleep with the cold pack on your skin. Take off the ice if your skin turns bright red. This is very important. If you cannot feel pain, heat, or cold, you have a greater risk of damage to the area. Move your fingers often to reduce stiffness and swelling. General instructions Take over-the-counter and prescription medicines only as told by your doctor. Rest your wrist from any activity that may cause pain. If needed, talk with your boss at work  about changes that can help your wrist heal. Do exercises as told by your doctor, physical therapist, or occupational therapist. Keep all follow-up visits. Contact a doctor if: You have new symptoms. Medicine does not help your pain. Your symptoms get worse. Get help right away if: You have very bad numbness or tingling in your wrist or hand. Summary Carpal tunnel syndrome is a condition that causes pain in your hand and arm. It is often caused by repeated wrist motions. Lifestyle changes and medicines are used  to treat this problem. Surgery may help in very bad cases. Follow your doctor's instructions about wearing a splint, resting your wrist, keeping follow-up visits, and calling for help. This information is not intended to replace advice given to you by your health care provider. Make sure you discuss any questions you have with your healthcare provider. Document Revised: 08/07/2019 Document Reviewed: 08/07/2019 Elsevier Patient Education  2022 [employer]    Wrist Splint or Brace, Adult A wrist splint or brace holds your wrist in position so it does not move. A splint or brace provides support for the wrist, but a brace is less stiff than a splint and is often used for a longer time. You can take off a splint or brace or make it loose. You may need a wrist splint or brace if you hurt yourwrist or have swelling in your wrist. What are the risks? Reduced blood flow. This can happen if the splint or brace is too tight or if you have a lot of swelling. Lack of blood flow can cause a condition called compartment syndrome. Symptoms of this condition include: Pain in your wrist that gets worse. Tingling. Having no feeling in your wrist or hand (numbness). Pale or blue skin. Cold fingers. There are other risks, such as: A stiff wrist. A weak wrist. Itching, rash, sores, or infection. How to wear your wrist splint or brace Your splint or brace should be tight enough to support your wrist. But it should not be too tight because it can block the flow of blood to your wrist. Your doctor will tell you how to wear your splint or brace and how long to wear it. Wear the splint or brace as told by your doctor. Only take it off as told by your doctor. Loosen the splint or brace if your fingers tingle, get numb, or turn cold and blue. Do not stick anything inside the splint or brace to scratch your skin. Check the skin around the splint or brace every day. Tell your doctor if you see problems. Keep the  splint or brace clean. If the splint or brace is not waterproof: Do not let it get wet. Cover it with a watertight covering when you take a bath or a shower.  General recommendations Do not put pressure on any part of the splint until it is fully hardened. Clean your splint or brace regularly. Make sure it is dry before you put it on. To clean and care for your splint or brace: Follow directions from your doctor. Read the cleaning instructions that came with the splint or brace. Follow these instructions at home: Managing pain, stiffness, and swelling  If told, put ice on the injured area. If you a have a splint or brace that can be taken off, take it off as told by your doctor. Put ice in a plastic bag. Place a towel between your skin and the bag. Leave the ice on for 20 minutes, 2-3 times a  day. Take off the ice if your skin turns bright red. This is very important. If you cannot feel pain, heat, or cold, you have a greater risk of damage to the area. Move your fingers often to avoid stiffness and to lessen swelling. Raise the injured area above the level of your heart while you are sitting or lying down.  Activity Do exercises as told by your doctor. Ask your doctor when it is safe to drive with a splint or brace on your wrist. Return to your normal activities as told by your doctor. Ask your doctor what activities are safe for you. General instructions Do not use the injured hand to do heavy work, such as lifting, pulling or pushing. Do not smoke or use any products that contain nicotine or tobacco. If you need help quitting, ask your doctor. Take over-the-counter and prescription medicines only as told by your doctor. Keep all follow-up visits. Get help if: You have wrist pain or swelling that does not go away. The skin around or under your splint or brace gets red, itchy, or moist. You have chills or fever. Your splint or brace feels too tight or too loose. Your splint or  brace breaks. Get help right away if: You have pain that gets worse. You have tingling and numbness. You have changes in skin color, including paleness or a bluish color. Your fingers are cold. Summary A wrist splint or brace is a device that supports your wrist and keeps it from moving. Wear your splint or brace as told by your doctor. This helps your wrist to heal correctly. Use ice on your wrist. Also, move your fingers often and raise your wrist above the level of your heart when you sit or lie down. Your splint or brace should be tight enough to support your wrist. Do not make it too tight. Get help right away if your fingers tingle, get numb, or turn cold and blue. Loosen the splint or brace right away. This information is not intended to replace advice given to you by your health care provider. Make sure you discuss any questions you have with your healthcare provider. Document Revised: 07/31/2019 Document Reviewed: 07/31/2019 Elsevier Patient Education  2022 [employer].

## 2020-10-22 NOTE — Progress Notes (Signed)
Name: Dana Peters   MRN: [id number]    DOB: May 17, 1945   Date:10/22/2020       Progress Note  Chief Complaint  Patient presents with   Edema    Right hand     Subjective:   Dana Peters is a 75 y.o. 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  She had some improvement with her pain and tingling when she began to decrease the amount of activity she did but her symptoms have not resolved.  She feels numbness in her middle and fourth fingers and she feels most of her pain and swelling in her hand.  She has not tried any over-the-counter medications or treatments and has not worn a brace or splint She denies any prior injury to that wrist     Current Outpatient Medications:    aspirin EC 81 MG tablet, Take 1 tablet (81 mg total) by mouth daily., Disp: 30 tablet, Rfl: 11   Cholecalciferol (VITAMIN D) 2000 units CAPS, Take by mouth daily., Disp: , Rfl:    DULoxetine (CYMBALTA) 30 MG capsule, Take 1 capsule (30 mg total) by mouth daily., Disp: 90 capsule, Rfl: 3   ezetimibe (ZETIA) 10 MG tablet, Take 1 tablet (10 mg total) by mouth daily., Disp: 90 tablet, Rfl: 3   loratadine (CLARITIN) 10 MG tablet, Take 1 tablet (10 mg total) by mouth daily as needed., Disp: 90 tablet, Rfl: 3   rosuvastatin (CRESTOR) 5 MG tablet, Take 5 mg po every other day at bedtime for atherosclerotic disease, Disp: 45 tablet, Rfl: 3  Patient Active Problem List   Diagnosis Date Noted   Lymphedema 02/26/2020   Personal history of colonic polyps    Bilateral lower extremity edema 09/27/2017   Emphysema of lung (HCC) 08/14/2017   Ganglion cyst of finger 06/25/2017   Full code status 06/25/2017   Osteoarthritis of knee 06/12/2017   Baker's cyst of knee, right  06/04/2017   Thoracic aortic atherosclerosis (HCC) 08/01/2016   Osteopenia 06/15/2016   Obesity (BMI 30.0-34.9) 05/29/2016   Urinary hesitancy 02/14/2016   PAD (peripheral artery disease) (HCC) 09/09/2015   Coronary artery calcification seen on CT scan 07/20/2015   Personal history of tobacco use, presenting hazards to health 07/14/2015   Foraminal stenosis of lumbar region 01/28/2015   Facet arthropathy, lumbar 01/28/2015   Spinal stenosis of lumbar region 01/28/2015   Neuropathy of both feet 01/07/2015   Leg pain, bilateral 01/07/2015   Leg heaviness 01/07/2015   CKD (chronic kidney disease) stage 2, GFR 60-89 ml/min 10/17/2014   Allergic rhinitis 10/14/2014   Dyslipidemia 10/14/2014   Hypertension    Vitamin D deficiency disease    GERD (gastroesophageal reflux disease) 02/02/2014    Past Surgical History:  Procedure Laterality Date   ABDOMINAL HYSTERECTOMY     complete, due to heavy   CATARACT EXTRACTION, BILATERAL     COLONOSCOPY WITH PROPOFOL N/A 08/29/2019   Procedure: COLONOSCOPY WITH PROPOFOL;  Surgeon: Midge Minium, MD;  Location: [facility];  Service: Endoscopy;  Laterality: N/A;  priority 4   ECTOPIC PREGNANCY SURGERY      Family History  Problem Relation Age of Onset   Cancer Mother        colon   Hypertension Mother  Alzheimer's disease Mother    Cancer Father        prostate   Cancer Brother        lung   Stroke Brother    Cancer Brother        prostate and brain   Cancer Brother        throat   Diabetes Paternal Aunt    Breast cancer Paternal Aunt    Healthy Daughter    Cirrhosis Brother     Social History   Tobacco Use   Smoking status: Former    Packs/day: 1.00    Years: 30.00    Pack years: 30.00    Types: Cigarettes    Quit date: 10/13/2005    Years since quitting: 15.0   Smokeless tobacco: Former    Types: Snuff    Quit date: 10/09/2011  Vaping Use   Vaping Use: Never used  Substance Use Topics   Alcohol use: No     Alcohol/week: 0.0 standard drinks   Drug use: No     Allergies  Allergen Reactions   Atorvastatin Itching   Codeine Sulfate Itching   Pravastatin Nausea Only   Protonix [Pantoprazole Sodium] Other (See Comments)    Legs felt heavy   Tylenol With Codeine #3  [Acetaminophen-Codeine] Itching   Penicillin G Rash    Health Maintenance  Topic Date Due   Zoster Vaccines- Shingrix (1 of 2) Never done   INFLUENZA VACCINE  11/08/2020   MAMMOGRAM  01/25/2021   DEXA SCAN  01/25/2022   TETANUS/TDAP  01/11/2024   COLONOSCOPY (Pts 45-56yrs Insurance coverage will need to be confirmed)  08/28/2024   COVID-19 Vaccine  Completed   Hepatitis C Screening  Completed   PNA vac Low Risk Adult  Completed   HPV VACCINES  Aged Out    Chart Review Today: I personally reviewed active problem list, medication list, allergies, family history, social history, health maintenance, notes from last encounter, lab results, imaging with the patient/caregiver today.   Review of Systems  Constitutional: Negative.   HENT: Negative.    Eyes: Negative.   Respiratory: Negative.    Cardiovascular: Negative.   Gastrointestinal: Negative.   Endocrine: Negative.   Genitourinary: Negative.   Musculoskeletal: Negative.   Skin: Negative.   Allergic/Immunologic: Negative.   Neurological: Negative.   Hematological: Negative.   Psychiatric/Behavioral: Negative.    All other systems reviewed and are negative.   Objective:   Vitals:   10/22/20 1321  BP: 124/78  Pulse: 80  Resp: 16  Temp: 98.2 F (36.8 C)  SpO2: 93%  Weight: 203 lb 11.2 oz (92.4 kg)  Height: 5\' 4"  (1.626 m)    Body mass index is 34.97 kg/m.  Physical Exam Vitals and nursing note reviewed.  Constitutional:      General: She is not in acute distress.    Appearance: Normal appearance. She is not ill-appearing, toxic-appearing or diaphoretic.  HENT:     Head: Normocephalic and atraumatic.  Musculoskeletal:     Right wrist: No  swelling, deformity, effusion, tenderness, bony tenderness, snuff box tenderness or crepitus. Decreased range of motion. Normal pulse.     Left wrist: Swelling present. No tenderness. Decreased range of motion.     Right hand: No swelling, deformity, tenderness or bony tenderness. Normal range of motion. Normal strength. Decreased sensation. Normal capillary refill. Normal pulse.  Skin:    General: Skin is warm and dry.     Capillary Refill: Capillary refill takes less than  2 seconds.     Coloration: Skin is not jaundiced or pale.     Findings: No lesion.  Neurological:     Mental Status: She is alert. Mental status is at baseline.     Gait: Gait normal.  Psychiatric:        Mood and Affect: Mood normal.        Assessment & Plan:     ICD-10-CM   1. Right wrist pain  M25.531 meloxicam (MOBIC) 15 MG tablet   arthitis vs carpal tunnel sx-right-hand-dominant female with pain swelling and tingling developing about 4 months ago-see below    2. Right hand pain  M79.641 meloxicam (MOBIC) 15 MG tablet    3. Dyslipidemia  E78.5 rosuvastatin (CRESTOR) 5 MG tablet    ezetimibe (ZETIA) 10 MG tablet    4. PAD (peripheral artery disease) (HCC)  I73.9 rosuvastatin (CRESTOR) 5 MG tablet    ezetimibe (ZETIA) 10 MG tablet    5. Coronary artery calcification seen on CT scan  I25.10 rosuvastatin (CRESTOR) 5 MG tablet    6. Thoracic aortic atherosclerosis (HCC)  I70.0 rosuvastatin (CRESTOR) 5 MG tablet    7. Medication refill  Z76.0 rosuvastatin (CRESTOR) 5 MG tablet    ezetimibe (ZETIA) 10 MG tablet    DULoxetine (CYMBALTA) 30 MG capsule     Patient with more strenuous use of her hands and wrists doing cleaning and some heavy lifting at a new job that she began last March or April.  She is right-hand dominant female she endorses wrist and hand pain and numbness and tingling into her fingers.  She does have limited range of motion on bilateral wrists so she was not able to do good technique with  carpal tunnel testing with the physical exam today she did have slight decreased sensation to light touch in her middle and ring finger on her right hand. Discussed referring to a Ortho or hand specialist but she declined at this time Encouraged her to rest and avoid heavy lifting or repetitive activity, when wrist pain or swelling flares up she can use ice, trial of Mobic and explained that she may want to use a wrist cock-up splint to help immobilize her wrist to allow for comfort and decrease inflammation  Her medications were refilled today, she is due for routine follow-up and is scheduled for 1 month from now will check her labs and monitor renal function at her next appointment  Keep appt in August for routine f/up  Dana Berry, PA-C 10/22/20 1:42 PM

## 2020-11-16 ENCOUNTER — Ambulatory Visit (INDEPENDENT_AMBULATORY_CARE_PROVIDER_SITE_OTHER): Payer: Medicare HMO | Admitting: Family Medicine

## 2020-11-16 ENCOUNTER — Encounter: Payer: Self-pay | Admitting: Family Medicine

## 2020-11-16 ENCOUNTER — Other Ambulatory Visit: Payer: Self-pay

## 2020-11-16 VITALS — BP 122/84 | HR 77 | Temp 98.3°F | Resp 16 | Ht 64.0 in | Wt 205.7 lb

## 2020-11-16 DIAGNOSIS — I2510 Atherosclerotic heart disease of native coronary artery without angina pectoris: Secondary | ICD-10-CM

## 2020-11-16 DIAGNOSIS — I700 Atherosclerosis of aorta: Secondary | ICD-10-CM | POA: Diagnosis not present

## 2020-11-16 DIAGNOSIS — N182 Chronic kidney disease, stage 2 (mild): Secondary | ICD-10-CM

## 2020-11-16 DIAGNOSIS — Z6835 Body mass index (BMI) 35.0-35.9, adult: Secondary | ICD-10-CM

## 2020-11-16 DIAGNOSIS — E785 Hyperlipidemia, unspecified: Secondary | ICD-10-CM

## 2020-11-16 DIAGNOSIS — I10 Essential (primary) hypertension: Secondary | ICD-10-CM

## 2020-11-16 DIAGNOSIS — J301 Allergic rhinitis due to pollen: Secondary | ICD-10-CM

## 2020-11-16 DIAGNOSIS — Z5181 Encounter for therapeutic drug level monitoring: Secondary | ICD-10-CM

## 2020-11-16 DIAGNOSIS — Z87891 Personal history of nicotine dependence: Secondary | ICD-10-CM

## 2020-11-16 DIAGNOSIS — Z1231 Encounter for screening mammogram for malignant neoplasm of breast: Secondary | ICD-10-CM | POA: Diagnosis not present

## 2020-11-16 DIAGNOSIS — I739 Peripheral vascular disease, unspecified: Secondary | ICD-10-CM

## 2020-11-16 DIAGNOSIS — M25531 Pain in right wrist: Secondary | ICD-10-CM

## 2020-11-16 DIAGNOSIS — M79641 Pain in right hand: Secondary | ICD-10-CM

## 2020-11-16 LAB — BASIC METABOLIC PANEL WITHOUT GFR
BUN: 14 mg/dL (ref 7–25)
CO2: 28 mmol/L (ref 20–32)
Calcium: 9.3 mg/dL (ref 8.6–10.4)
Chloride: 105 mmol/L (ref 98–110)
Creat: 0.92 mg/dL (ref 0.60–1.00)
Glucose, Bld: 100 mg/dL — ABNORMAL HIGH (ref 65–99)
Potassium: 4.3 mmol/L (ref 3.5–5.3)
Sodium: 141 mmol/L (ref 135–146)
eGFR: 65 mL/min/{1.73_m2}

## 2020-11-16 NOTE — Patient Instructions (Addendum)
Health Maintenance  Topic Date Due   Flu Shot  01/02/2021*   Zoster (Shingles) Vaccine (1 of 2) 02/16/2021*   Mammogram  01/25/2021   DEXA scan (bone density measurement)  01/25/2022   Tetanus Vaccine  01/11/2024   Colon Cancer Screening  08/28/2024   COVID-19 Vaccine  Completed   Hepatitis C Screening: USPSTF Recommendation to screen - Ages 18-76 yo.  Completed   Pneumonia vaccines  Completed   HPV Vaccine  Aged Out  *Topic was postponed. The date shown is not the original due date.   [facility] at [facility] [address] [place],  [place]  [address] Get Driving Directions Main: [phone]  Call to schedule mammogram for October  If you would like we can send shingrix to your pharmacy or if you check with your health insurance coverage we can administer shingrix to you in office

## 2020-11-16 NOTE — Progress Notes (Signed)
Name: Dana Peters   MRN: [id number]    DOB: 1946/04/09   Date:11/16/2020       Progress Note  Chief Complaint  Patient presents with   Hyperlipidemia   Hypertension     Subjective:   Dana Peters is a 75 y.o. female, presents to clinic for routine f/up  Hypertension:  Currently managed on diet/lifestyle, prior BP high Blood pressure today is well controlled. BP Readings from Last 3 Encounters:  10/22/20 124/78  08/31/20 140/80  05/17/20 124/82   Pt denies CP, SOB, exertional sx, LE edema, palpitation, Ha's, visual disturbances, lightheadedness, hypotension, syncope. Dietary efforts for BP?  Healthy diet/lifestyle  CKD stage 2- last oct she had slight decrease in GFR but returned to her baseline Renal function recent labs:  Lab Results  Component Value Date   GFRAA 69 05/17/2020   GFRAA 58 (L) 01/29/2020   GFRAA 63 09/29/2019    Lab Results  Component Value Date   CREATININE 0.94 (H) 05/17/2020   BUN 12 05/17/2020   NA 142 05/17/2020   K 4.5 05/17/2020   CL 104 05/17/2020   CO2 31 05/17/2020   LE edema - stable, baseline - no pitting  Hyperlipidemia: Currently treated with crestor and zetia, pt reports good med compliance Last Lipids: Lab Results  Component Value Date   CHOL 129 05/17/2020   HDL 53 05/17/2020   LDLCALC 56 05/17/2020   TRIG 118 05/17/2020   CHOLHDL 2.4 05/17/2020   - Denies: Chest pain, shortness of breath, myalgias, claudication Hx of PAD, CAD on CT scan, thoracic aortic atherosclesis  Right wrist pain - trial of mobic with renal monitoring, also resting, brace, pt declined referral last month  Still pain, no worsening, agrees to consult for further eval    Current Outpatient Medications:    aspirin EC 81 MG tablet, Take 1 tablet (81 mg total) by mouth daily., Disp: 30 tablet, Rfl: 11   Cholecalciferol (VITAMIN D) 2000 units CAPS, Take by mouth daily., Disp: , Rfl:    DULoxetine (CYMBALTA) 30 MG capsule, Take 1 capsule  (30 mg total) by mouth daily., Disp: 90 capsule, Rfl: 3   ezetimibe (ZETIA) 10 MG tablet, Take 1 tablet (10 mg total) by mouth daily., Disp: 90 tablet, Rfl: 3   loratadine (CLARITIN) 10 MG tablet, Take 1 tablet (10 mg total) by mouth daily as needed., Disp: 90 tablet, Rfl: 3   meloxicam (MOBIC) 15 MG tablet, Take 1 tablet (15 mg total) by mouth daily as needed for pain., Disp: 30 tablet, Rfl: 0   rosuvastatin (CRESTOR) 5 MG tablet, Take 5 mg po every other day at bedtime for atherosclerotic disease, Disp: 45 tablet, Rfl: 3  Patient Active Problem List   Diagnosis Date Noted   Lymphedema 02/26/2020   Personal history of colonic polyps    Bilateral lower extremity edema 09/27/2017   Emphysema of lung (HCC) 08/14/2017   Ganglion cyst of finger 06/25/2017   Full code status 06/25/2017   Osteoarthritis of knee 06/12/2017   Baker's cyst of knee, right 06/04/2017   Thoracic aortic atherosclerosis (HCC) 08/01/2016   Osteopenia 06/15/2016   Obesity (BMI 30.0-34.9) 05/29/2016   Urinary hesitancy 02/14/2016   PAD (peripheral artery disease) (HCC) 09/09/2015   Coronary artery calcification seen on CT scan 07/20/2015   Personal history of tobacco use, presenting hazards to health 07/14/2015   Foraminal stenosis of lumbar region 01/28/2015   Facet arthropathy, lumbar 01/28/2015   Spinal stenosis of lumbar region  01/28/2015   Neuropathy of both feet 01/07/2015   Leg pain, bilateral 01/07/2015   Leg heaviness 01/07/2015   CKD (chronic kidney disease) stage 2, GFR 60-89 ml/min 10/17/2014   Allergic rhinitis 10/14/2014   Dyslipidemia 10/14/2014   Hypertension    Vitamin D deficiency disease    GERD (gastroesophageal reflux disease) 02/02/2014    Past Surgical History:  Procedure Laterality Date   ABDOMINAL HYSTERECTOMY     complete, due to heavy   CATARACT EXTRACTION, BILATERAL     COLONOSCOPY WITH PROPOFOL N/A 08/29/2019   Procedure: COLONOSCOPY WITH PROPOFOL;  Surgeon: Midge Minium, MD;   [facility];  Service: Endoscopy;  Laterality: N/A;  priority 4   ECTOPIC PREGNANCY SURGERY      Family History  Problem Relation Age of Onset   Cancer Mother        colon   Hypertension Mother    Alzheimer's disease Mother    Cancer Father        prostate   Cancer Brother        lung   Stroke Brother    Cancer Brother        prostate and brain   Cancer Brother        throat   Diabetes Paternal Aunt    Breast cancer Paternal Aunt    Healthy Daughter    Cirrhosis Brother     Social History   Tobacco Use   Smoking status: Former    Packs/day: 1.00    Years: 30.00    Pack years: 30.00    Types: Cigarettes    Quit date: 10/13/2005    Years since quitting: 15.1   Smokeless tobacco: Former    Types: Snuff    Quit date: 10/09/2011  Vaping Use   Vaping Use: Never used  Substance Use Topics   Alcohol use: No    Alcohol/week: 0.0 standard drinks   Drug use: No     Allergies  Allergen Reactions   Atorvastatin Itching   Codeine Sulfate Itching   Pravastatin Nausea Only   Protonix [Pantoprazole Sodium] Other (See Comments)    Legs felt heavy   Tylenol With Codeine #3  [Acetaminophen-Codeine] Itching   Penicillin G Rash    Health Maintenance  Topic Date Due   Zoster Vaccines- Shingrix (1 of 2) Never done   INFLUENZA VACCINE  01/02/2021 (Originally 11/08/2020)   MAMMOGRAM  01/25/2021   DEXA SCAN  01/25/2022   TETANUS/TDAP  01/11/2024   COLONOSCOPY (Pts 45-8yrs Insurance coverage will need to be confirmed)  08/28/2024   COVID-19 Vaccine  Completed   Hepatitis C Screening  Completed   PNA vac Low Risk Adult  Completed   HPV VACCINES  Aged Out    Chart Review Today: I personally reviewed active problem list, medication list, allergies, family history, social history, health maintenance, notes from last encounter, lab results, imaging with the patient/caregiver today.   Review of Systems  Constitutional: Negative.   HENT: Negative.    Eyes:  Negative.   Respiratory: Negative.    Cardiovascular: Negative.   Gastrointestinal: Negative.   Endocrine: Negative.   Genitourinary: Negative.   Musculoskeletal: Negative.   Skin: Negative.   Allergic/Immunologic: Negative.   Neurological: Negative.   Hematological: Negative.   Psychiatric/Behavioral: Negative.    All other systems reviewed and are negative.   Objective:   Vitals:   11/16/20 1002  BP: 122/84  Pulse: 77  Resp: 16  Temp: 98.3 F (36.8 C)  SpO2: 95%  Weight: 205 lb 11.2 oz (93.3 kg)  Height:  (1.626 m)    Body mass index is 34.97 kg/m.  Physical Exam Vitals and nursing note reviewed.  Constitutional:      General: She is not in acute distress.    Appearance: Normal appearance. She is well-developed. She is not ill-appearing, toxic-appearing or diaphoretic.     Interventions: Face mask in place.  HENT:     Head: Normocephalic and atraumatic.     Right Ear: External ear normal.     Left Ear: External ear normal.  Eyes:     General: Lids are normal. No scleral icterus.       Right eye: No discharge.        Left eye: No discharge.     Conjunctiva/sclera: Conjunctivae normal.  Neck:     Trachea: Phonation normal. No tracheal deviation.  Cardiovascular:     Rate and Rhythm: Normal rate and regular rhythm.     Pulses: Normal pulses.          Radial pulses are 2+ on the right side and 2+ on the left side.       Posterior tibial pulses are 2+ on the right side and 2+ on the left side.     Heart sounds: Normal heart sounds. No murmur heard.   No friction rub. No gallop.  Pulmonary:     Effort: Pulmonary effort is normal. No respiratory distress.     Breath sounds: Normal breath sounds. No stridor. No wheezing, rhonchi or rales.  Chest:     Chest wall: No tenderness.  Abdominal:     General: Bowel sounds are normal. There is no distension.     Palpations: Abdomen is soft.  Musculoskeletal:     Right lower leg: No edema.     Left lower leg: No  edema.  Skin:    General: Skin is warm and dry.     Coloration: Skin is not jaundiced or pale.     Findings: No rash.  Neurological:     Mental Status: She is alert.     Motor: No abnormal muscle tone.     Gait: Gait normal.  Psychiatric:        Mood and Affect: Mood normal.        Speech: Speech normal.        Behavior: Behavior normal.        Assessment & Plan:     ICD-10-CM   1. Dyslipidemia  E78.5    tolerating statin 2 d a week and taking zetia daily - last labs improved    2. Essential hypertension  I10 BASIC METABOLIC PANEL WITH GFR   Blood pressure at goal today with management consisting of diet and lifestyle    3. Coronary artery calcification seen on CT scan  I25.10    Not symptomatic with exertion    4. PAD (peripheral artery disease) (HCC)  I73.9    No atrophy, pallor, pulselessness or claudication type symptoms, on statin    5. Thoracic aortic atherosclerosis (HCC)  I70.0    On statin, monitoring    6. Class 2 severe obesity with serious comorbidity and body mass index (BMI) of 35.0 to 35.9 in adult, unspecified obesity type (HCC)  E66.01    Z68.35    With comorbidities, hyperlipidemia, PAD, CAD, emphysema, hypertension, encouraged to continue to work on increased activity and decreased calorie intake    7. Seasonal allergic rhinitis due to pollen  J30.1    Currently well controlled    8. CKD (chronic kidney disease) stage 2, GFR 60-89 ml/min  N18.2 BASIC METABOLIC PANEL WITH GFR   Monitoring last labs showed improved GFR to baseline    9. Encounter for medication monitoring  Z51.81 BASIC METABOLIC PANEL WITH GFR    10. Encounter for screening mammogram for malignant neoplasm of breast  Z12.31 MM 3D SCREEN BREAST BILATERAL    11. Personal history of tobacco use, presenting hazards to health  Z87.891 Ambulatory Referral Lung Cancer Screening [facility]   Is due for likely her last annual low-dose CT screening for lung cancer quit smoking in 2017  this year would be 15 years since quitting    12. Right wrist pain  M25.531 Ambulatory referral to Orthopedics   Referral to hand specialist and or Ortho please send last office visit in July with consult    13. Right hand pain  M79.641 Ambulatory referral to Orthopedics   Same as above       Return in about 6 months (around 05/19/2021) for Routine follow-up.   Danelle Berry, PA-C 11/16/20 10:10 AM

## 2020-11-18 DIAGNOSIS — G5601 Carpal tunnel syndrome, right upper limb: Secondary | ICD-10-CM | POA: Diagnosis not present

## 2020-12-20 ENCOUNTER — Other Ambulatory Visit: Payer: Self-pay | Admitting: *Deleted

## 2020-12-20 DIAGNOSIS — Z87891 Personal history of nicotine dependence: Secondary | ICD-10-CM

## 2020-12-29 ENCOUNTER — Other Ambulatory Visit: Payer: Self-pay

## 2020-12-29 ENCOUNTER — Ambulatory Visit
Admission: RE | Admit: 2020-12-29 | Discharge: 2020-12-29 | Disposition: A | Discharge status: Home / Self Care | Payer: Medicare HMO | Source: Ambulatory Visit | Attending: Acute Care | Admitting: Acute Care

## 2020-12-29 DIAGNOSIS — Z87891 Personal history of nicotine dependence: Secondary | ICD-10-CM | POA: Insufficient documentation

## 2021-01-04 DIAGNOSIS — H1045 Other chronic allergic conjunctivitis: Secondary | ICD-10-CM | POA: Diagnosis not present

## 2021-01-04 DIAGNOSIS — H26493 Other secondary cataract, bilateral: Secondary | ICD-10-CM | POA: Diagnosis not present

## 2021-01-04 DIAGNOSIS — H5203 Hypermetropia, bilateral: Secondary | ICD-10-CM | POA: Diagnosis not present

## 2021-01-04 DIAGNOSIS — Z961 Presence of intraocular lens: Secondary | ICD-10-CM | POA: Diagnosis not present

## 2021-01-18 NOTE — Progress Notes (Signed)
Please call patient and let them  know their  low dose Ct was read as a Lung RADS 2: nodules that are benign in appearance and behavior with a very low likelihood of becoming a clinically active cancer due to size or lack of growth. Recommendation per radiology is for a repeat LDCT in 12 months. .Please let them  know we will order and schedule their  annual screening scan for 12/2021. Please let them  know there was notation of CAD on their  scan.  Please remind the patient  that this is a non-gated exam therefore degree or severity of disease  cannot be determined. Please have them  follow up with their PCP regarding potential risk factor modification, dietary therapy or pharmacologic therapy if clinically indicated. Pt.  is  currently on statin therapy. Please place order for annual  screening scan for  12/2021 and fax results to PCP. Thanks so much.  + CAD but on a statin, ? NSIP that needs to be followed by Pulmonary, so she needs a referral if she agrees. Also, concern for PAH, and CAD. She needs PCP follow up for the above, consider echo.  Thanks so much

## 2021-01-26 ENCOUNTER — Other Ambulatory Visit: Payer: Self-pay

## 2021-01-26 ENCOUNTER — Ambulatory Visit
Admission: RE | Admit: 2021-01-26 | Discharge: 2021-01-26 | Disposition: A | Discharge status: Home / Self Care | Payer: Medicare HMO | Source: Ambulatory Visit | Attending: Family Medicine | Admitting: Family Medicine

## 2021-01-26 DIAGNOSIS — Z1231 Encounter for screening mammogram for malignant neoplasm of breast: Secondary | ICD-10-CM | POA: Insufficient documentation

## 2021-01-27 ENCOUNTER — Other Ambulatory Visit: Payer: Self-pay | Admitting: *Deleted

## 2021-01-27 DIAGNOSIS — Z87891 Personal history of nicotine dependence: Secondary | ICD-10-CM

## 2021-02-09 ENCOUNTER — Other Ambulatory Visit: Payer: Self-pay

## 2021-02-09 ENCOUNTER — Ambulatory Visit (INDEPENDENT_AMBULATORY_CARE_PROVIDER_SITE_OTHER): Payer: Medicare HMO

## 2021-02-09 DIAGNOSIS — Z23 Encounter for immunization: Secondary | ICD-10-CM

## 2021-02-23 ENCOUNTER — Encounter: Payer: Self-pay | Admitting: Internal Medicine

## 2021-02-23 ENCOUNTER — Ambulatory Visit: Payer: Medicare HMO | Admitting: Internal Medicine

## 2021-02-23 ENCOUNTER — Other Ambulatory Visit: Payer: Self-pay

## 2021-02-23 VITALS — BP 140/90 | HR 70 | Temp 98.7°F | Ht 68.0 in | Wt 202.8 lb

## 2021-02-23 DIAGNOSIS — J849 Interstitial pulmonary disease, unspecified: Secondary | ICD-10-CM

## 2021-02-23 NOTE — Patient Instructions (Signed)
Follow up in 1 year after CT scan

## 2021-02-23 NOTE — Progress Notes (Signed)
[facility] Consultation      Date: 02/23/2021,   MRN# [id number] SEBRENIA ARCEMENT 19-Nov-1945 Code Status:  Code Status History     Date Active Date Inactive Code Status Order ID Comments User Context   06/25/2017 1225 04/28/2018 1529 Full Code [id number]  Arnetha Courser, MD Outpatient      Questions for Most Recent Historical Code Status (Order [id number])           Admission                  Current  Dana Peters is a 75 y.o. old female seen in consultation for abnormal CT chest      CHIEF COMPLAINT:   Assessment for abnormal CT chest   HISTORY OF PRESENT ILLNESS   75 year old pleasant African-American female seen today for abnormal CT chest Patient is enrolled in the lung cancer screening program She was referred to [name] due to radiology reading of NSIP  Patient does not have any respiratory issues at this time I have reviewed the CT scans in depth and individually with the patient.  We have reviewed CT scans from April 2017 and current CT scans There is a persistent bilateral peripherally reticulonodular groundglass opacifications most likely related to underlying interstitial lung disease with areas of emphysema and bronchiectasis.  Likely related to her underlying smoking   PAST MEDICAL HISTORY   Past Medical History:  Diagnosis Date   Allergy    Baker's cyst of knee, right 06/04/2017   [name] Feb 2019   CKD (chronic kidney disease) stage 2, GFR 60-89 ml/min    Dyslipidemia    GERD (gastroesophageal reflux disease)    Hypertension    Lumbar spinal stenosis    Neuralgia neuritis, sciatic nerve 08/28/2012   Neuropathy    both feet   Obesity (BMI 30.0-34.9) 05/29/2016   Osteopenia 06/15/2016   March 2018   Personal history of tobacco use, presenting hazards to health 07/14/2015   Reflux    Sciatica    Thoracic aortic atherosclerosis ([place]) 08/01/2016   Chest CT   Venous stasis    Vitamin D deficiency disease    Wears dentures    Full  upper, partial lower     SURGICAL HISTORY   Past Surgical History:  Procedure Laterality Date   ABDOMINAL HYSTERECTOMY     complete, due to heavy   CATARACT EXTRACTION, BILATERAL     COLONOSCOPY WITH PROPOFOL N/A 08/29/2019   Procedure: COLONOSCOPY WITH PROPOFOL;  Surgeon: [name], MD;  Location: [facility];  Service: Endoscopy;  Laterality: N/A;  priority 4   ECTOPIC PREGNANCY SURGERY       FAMILY HISTORY   Family History  Problem Relation Age of Onset   Cancer Mother        colon   Hypertension Mother    Alzheimer's disease Mother    Cancer Father        prostate   Cancer Brother        lung   Stroke Brother    Cancer Brother        prostate and brain   Cancer Brother        throat   Diabetes Paternal Aunt    Breast cancer Paternal Aunt    Healthy Daughter    Cirrhosis Brother      SOCIAL HISTORY   Social History   Tobacco Use   Smoking status: Former    Packs/day: 1.00    Years:  30.00    Pack years: 30.00    Types: Cigarettes    Quit date: 10/13/2005    Years since quitting: 15.3   Smokeless tobacco: Former    Types: Snuff    Quit date: 10/09/2011  Vaping Use   Vaping Use: Never used  Substance Use Topics   Alcohol use: No    Alcohol/week: 0.0 standard drinks   Drug use: No     MEDICATIONS    Home Medication:  Current Outpatient Rx   Order #: [id number] Class: No Print   Order #: [id number] Class: Historical Med   Order #: [id number] Class: Normal   Order #: [id number] Class: Normal   Order #: [id number] Class: Normal   Order #: [id number] Class: Normal    Current Medication:  Current Outpatient Medications:    aspirin EC 81 MG tablet, Take 1 tablet (81 mg total) by mouth daily., Disp: 30 tablet, Rfl: 11   Cholecalciferol (VITAMIN D) 2000 units CAPS, Take by mouth daily., Disp: , Rfl:    DULoxetine (CYMBALTA) 30 MG capsule, Take 1 capsule (30 mg total) by mouth daily., Disp: 90 capsule, Rfl: 3   ezetimibe (ZETIA) 10 MG tablet, Take 1  tablet (10 mg total) by mouth daily., Disp: 90 tablet, Rfl: 3   loratadine (CLARITIN) 10 MG tablet, Take 1 tablet (10 mg total) by mouth daily as needed., Disp: 90 tablet, Rfl: 3   rosuvastatin (CRESTOR) 5 MG tablet, Take 5 mg po every other day at bedtime for atherosclerotic disease, Disp: 45 tablet, Rfl: 3    ALLERGIES   Atorvastatin, Codeine sulfate, Pravastatin, Protonix [pantoprazole sodium], Tylenol with codeine #3  [acetaminophen-codeine], and Penicillin g     REVIEW OF SYSTEMS    Review of Systems:  Gen:  Denies  fever, sweats, chills weigh loss  HEENT: Denies blurred vision, double vision, ear pain, eye pain, hearing loss, nose bleeds, sore throat Cardiac:  No dizziness, chest pain or heaviness, chest tightness,edema Resp:   Denies cough or sputum porduction, shortness of breath,wheezing, hemoptysis,  Gi: Denies swallowing difficulty, stomach pain, nausea or vomiting, diarrhea, constipation, bowel incontinence Gu:  Denies bladder incontinence, burning urine Ext:   Denies Joint pain, stiffness or swelling Skin: Denies  skin rash, easy bruising or bleeding or hives Endoc:  Denies polyuria, polydipsia , polyphagia or weight change Psych:   Denies depression, insomnia or hallucinations   Other:  All other systems negative   BP 140/90 (BP Location: Left Arm, Patient Position: Sitting, Cuff Size: Large)   Pulse 70   Temp 98.7 F (37.1 C) (Oral)   Ht 5\' 8"  (1.727 m)   Wt 202 lb 12.8 oz (92 kg)   LMP  (LMP Unknown)   SpO2 94%   BMI 30.84 kg/m      Physical Examination:   General Appearance: No distress  EYES PERRLA, EOM intact.   NECK Supple, No JVD Pulmonary: normal breath sounds, No wheezing.  CardiovascularNormal S1,S2.  No m/r/g.   Abdomen: Benign, Soft, non-tender. Skin:   warm, no rashes, no ecchymosis  Extremities: normal, no cyanosis, clubbing. Neuro:without focal findings,  speech normal  PSYCHIATRIC: Mood, affect within normal limits.   ALL OTHER  ROS ARE NEGATIVE      IMAGING    MM 3D SCREEN BREAST BILATERAL  Result Date: 01/31/2021 CLINICAL DATA:  Screening. EXAM: DIGITAL SCREENING BILATERAL MAMMOGRAM WITH TOMOSYNTHESIS AND CAD TECHNIQUE: Bilateral screening digital craniocaudal and mediolateral oblique mammograms were obtained. Bilateral screening digital breast tomosynthesis was performed. The images were evaluated with computer-aided  detection. COMPARISON:  Previous exam(s). ACR Breast Density Category b: There are scattered areas of fibroglandular density. FINDINGS: There are no findings suspicious for malignancy. IMPRESSION: No mammographic evidence of malignancy. A result letter of this screening mammogram will be mailed directly to the patient. RECOMMENDATION: Screening mammogram in one year. (Code:SM-B-01Y) BI-RADS CATEGORY  1: Negative. Electronically Signed   By: Lajean Manes M.D.   On: 01/31/2021 10:08     ASSESSMENT/PLAN    75 year old pleasant African-American female with abnormal CT chest findings consistent with very mild bilateral reticulonodular opacities in the periphery dating back to 2017, most likely related to previous smoking history  CT scans individually reviewed with the patient in detail starting with the most recent CT scan from September 2022 and then going back to April 2017 There is evidence of interstitial lung disease over the last 5 years that has not significantly progressed during that time interval   We have reviewed CT scans from April 2017 and current CT scans There is a persistent bilateral peripherally reticulonodular groundglass opacifications most likely related to underlying interstitial lung disease with areas of emphysema and bronchiectasis.    Patient does not have any respiratory issues at this time Recommend follow-up in 1 year for  further assessment and interval changes   No indication for antibiotics, steroids, inhalers,  bronchoscopy or lung biopsy at this  time      MEDICATION ADJUSTMENTS/LABS AND TESTS ORDERED: Follow up 1 year   CURRENT MEDICATIONS REVIEWED AT [facility]   Patient  satisfied with Plan of action and management. All questions answered  Follow up 1 year  Total Time Spent  45 mins   Auden Wettstein Patricia Pesa, M.D.  [facility]  Medical Director [place] Director [facility] Cardio-Pulmonary Department

## 2021-03-30 ENCOUNTER — Encounter: Payer: Self-pay | Admitting: Nurse Practitioner

## 2021-03-30 ENCOUNTER — Ambulatory Visit (INDEPENDENT_AMBULATORY_CARE_PROVIDER_SITE_OTHER): Payer: Medicare HMO | Admitting: Nurse Practitioner

## 2021-03-30 ENCOUNTER — Ambulatory Visit: Payer: Self-pay | Admitting: *Deleted

## 2021-03-30 ENCOUNTER — Other Ambulatory Visit: Payer: Self-pay

## 2021-03-30 VITALS — BP 128/78 | HR 97 | Temp 98.5°F | Resp 18 | Ht 64.0 in | Wt 201.8 lb

## 2021-03-30 DIAGNOSIS — J439 Emphysema, unspecified: Secondary | ICD-10-CM

## 2021-03-30 DIAGNOSIS — R051 Acute cough: Secondary | ICD-10-CM

## 2021-03-30 DIAGNOSIS — J069 Acute upper respiratory infection, unspecified: Secondary | ICD-10-CM | POA: Diagnosis not present

## 2021-03-30 LAB — POC INFLUENZA A&B (BINAX/QUICKVUE)
Influenza A, POC: NEGATIVE
Influenza B, POC: NEGATIVE

## 2021-03-30 MED ORDER — PREDNISONE 10 MG PO TABS: ORAL_TABLET | ORAL | 0 refills | Status: DC

## 2021-03-30 MED ORDER — BENZONATATE 100 MG PO CAPS: 200.0000 mg | ORAL_CAPSULE | Freq: Three times a day (TID) | ORAL | 0 refills | Status: DC | PRN

## 2021-03-30 NOTE — Telephone Encounter (Signed)
° °  Chief Complaint: wheezing , started last night. Wants appt  Symptoms: wheezing comes and goes, sore throat better, chest congestion , cough up white mucus  Frequency: wheezing started last night  Pertinent Negatives: Patient denies difficutly breathing , SOB  no fever Disposition: [] ED /[] Urgent Care (no appt availability in office) / [x] Appointment(In office/virtual)/ []   Virtual Care/ [] Home Care/ [] Refused Recommended Disposition  Additional Notes:  Appt 03/30/21 11:00      Reason for Disposition  [1] MILD difficulty breathing (e.g., minimal/no SOB at rest, SOB with walking, pulse <100) AND [2] NEW-onset or WORSE than normal  Answer Assessment - Initial Assessment Questions 1. RESPIRATORY STATUS: "Describe your breathing?" (e.g., wheezing, shortness of breath, unable to speak, severe coughing)      Wheezing  2. ONSET: "When did this breathing problem begin?"      Last night comes and goes  3. PATTERN "Does the difficult breathing come and go, or has it been constant since it started?"      Comes and goes  4. SEVERITY: "How bad is your breathing?" (e.g., mild, moderate, severe)    - MILD: No SOB at rest, mild SOB with walking, speaks normally in sentences, can lie down, no retractions, pulse < 100.    - MODERATE: SOB at rest, SOB with minimal exertion and prefers to sit, cannot lie down flat, speaks in phrases, mild retractions, audible wheezing, pulse 100-120.    - SEVERE: Very SOB at rest, speaks in single words, struggling to breathe, sitting hunched forward, retractions, pulse > 120      sitting still hears wheezing more in chest  5. RECURRENT SYMPTOM: "Have you had difficulty breathing before?" If Yes, ask: "When was the last time?" and "What happened that time?"      A long time  6. CARDIAC HISTORY: "Do you have any history of heart disease?" (e.g., heart attack, angina, bypass surgery, angioplasty)      na 7. LUNG HISTORY: "Do you have any history of lung  disease?"  (e.g., pulmonary embolus, asthma, emphysema)     No  8. CAUSE: "What do you think is causing the breathing problem?"      Chest congestion cough up white mucus  9. OTHER SYMPTOMS: "Do you have any other symptoms? (e.g., dizziness, runny nose, cough, chest pain, fever)     Sore throat , chest congestion 10. O2 SATURATION MONITOR:  "Do you use an oxygen saturation monitor (pulse oximeter) at home?" If Yes, "What is your reading (oxygen level) today?" "What is your usual oxygen saturation reading?" (e.g., 95%)       na 11. PREGNANCY: "Is there any chance you are pregnant?" "When was your last menstrual period?"       na 12. TRAVEL: "Have you traveled out of the country in the last month?" (e.g., travel history, exposures)       na  Protocols used: Breathing Difficulty-A-AH

## 2021-03-30 NOTE — Progress Notes (Signed)
BP 128/78    Pulse 97    Temp 98.5 F (36.9 C) (Oral)    Resp 18    Ht 5\' 4"  (1.626 m)    Wt 201 lb 12.8 oz (91.5 kg)    LMP  (LMP Unknown)    SpO2 98%    BMI 34.64 kg/m    Subjective:    Patient ID: , female    DOB: [birth year], 75 y.o.   MRN: [id number]  HPI: Dana Peters is a 75 y.o. female, here alone  Chief Complaint  Patient presents with   URI    Cough, chest congestion, wheezing, sore throat    URI: Symptoms started Monday night.  Symptoms include sore throat, cough, chest congestion, nasal congestion and sneezing. She has been taking Zicam, and robitussin. She denies any fever. She does say that she feels a little short of breath. Will test for Covid and Flu. Discussed OTC treatments for symptoms.  Will send in prescription for tessalon perls and prednisone.    Emphysema: She used to be a smoker.  She quit in 2007.  She says that she feels like she has bronchitis. She says she has had bronchitis many times.  She does say she feels a little short of breath. She denies any fever or chills. Will give prednisone.  Relevant past medical, surgical, family and social history reviewed and updated as indicated. Interim medical history since our last visit reviewed. Allergies and medications reviewed and updated.  Review of Systems  Constitutional: Negative for fever or weight change.  HEENT: positive for sore throat, nasal congestion Respiratory: Positive for cough and shortness of breath.   Cardiovascular: Negative for chest pain or palpitations.  Gastrointestinal: Negative for abdominal pain, no bowel changes.  Musculoskeletal: Negative for gait problem or joint swelling.  Skin: Negative for rash.  Neurological: Negative for dizziness or headache.  No other specific complaints in a complete review of systems (except as listed in HPI above).      Objective:    BP 128/78    Pulse 97    Temp 98.5 F (36.9 C) (Oral)    Resp 18    Ht 5\' 4"  (1.626 m)    Wt  201 lb 12.8 oz (91.5 kg)    LMP  (LMP Unknown)    SpO2 98%    BMI 34.64 kg/m   Wt Readings from Last 3 Encounters:  03/30/21 201 lb 12.8 oz (91.5 kg)  02/23/21 202 lb 12.8 oz (92 kg)  12/29/20 205 lb (93 kg)    Physical Exam  Constitutional: Patient appears well-developed and well-nourished. Obese No distress.  HEENT: head atraumatic, normocephalic, pupils equal and reactive to light, ears TMs clear, neck supple, throat within normal limits Cardiovascular: Normal rate, regular rhythm and normal heart sounds.  No murmur heard. No BLE edema. Pulmonary/Chest: Effort normal and breath sounds normal. No respiratory distress. Abdominal: Soft.  There is no tenderness. Psychiatric: Patient has a normal mood and affect. behavior is normal. Judgment and thought content normal.   Results for orders placed or performed in visit on 03/30/21  POC Influenza A&B (Binax test)  Result Value Ref Range   Influenza A, POC Negative Negative   Influenza B, POC Negative Negative      Assessment & Plan:   1. Viral upper respiratory tract infection -discussed OTC treatments for symptoms - benzonatate (TESSALON) 100 MG capsule; Take 2 capsules (200 mg total) by mouth 3 (three) times daily as needed  for cough.  Dispense: 20 capsule; Refill: 0 - POC Influenza A&B (Binax test) - predniSONE (DELTASONE) 10 MG tablet; Day 1 take 6 pills, day 2 take 5 pills, day 3, take 4 pills, day 4 take 3 pills, day 5 take 2 pills, day 6 take 1 pill  Dispense: 21 tablet; Refill: 0   2. Acute cough  - Novel Coronavirus, NAA ([facility]) - benzonatate (TESSALON) 100 MG capsule; Take 2 capsules (200 mg total) by mouth 3 (three) times daily as needed for cough.  Dispense: 20 capsule; Refill: 0 - POC Influenza A&B (Binax test) - predniSONE (DELTASONE) 10 MG tablet; Day 1 take 6 pills, day 2 take 5 pills, day 3, take 4 pills, day 4 take 3 pills, day 5 take 2 pills, day 6 take 1 pill  Dispense: 21 tablet; Refill: 0   3. Pulmonary  emphysema, unspecified emphysema type (HCC)  - predniSONE (DELTASONE) 10 MG tablet; Day 1 take 6 pills, day 2 take 5 pills, day 3, take 4 pills, day 4 take 3 pills, day 5 take 2 pills, day 6 take 1 pill  Dispense: 21 tablet; Refill: 0   Follow up plan: Return if symptoms worsen or fail to improve.

## 2021-03-31 LAB — SARS-COV-2, NAA 2 DAY TAT

## 2021-03-31 LAB — NOVEL CORONAVIRUS, NAA: SARS-CoV-2, NAA: NOT DETECTED

## 2021-05-20 ENCOUNTER — Encounter: Payer: Self-pay | Admitting: Internal Medicine

## 2021-05-20 ENCOUNTER — Ambulatory Visit (INDEPENDENT_AMBULATORY_CARE_PROVIDER_SITE_OTHER): Payer: Medicare HMO | Admitting: Internal Medicine

## 2021-05-20 VITALS — BP 134/72 | HR 75 | Temp 98.0°F | Resp 16 | Ht 64.0 in | Wt 197.5 lb

## 2021-05-20 DIAGNOSIS — N182 Chronic kidney disease, stage 2 (mild): Secondary | ICD-10-CM | POA: Diagnosis not present

## 2021-05-20 DIAGNOSIS — E559 Vitamin D deficiency, unspecified: Secondary | ICD-10-CM | POA: Diagnosis not present

## 2021-05-20 DIAGNOSIS — Z23 Encounter for immunization: Secondary | ICD-10-CM

## 2021-05-20 DIAGNOSIS — E669 Obesity, unspecified: Secondary | ICD-10-CM | POA: Diagnosis not present

## 2021-05-20 DIAGNOSIS — M85851 Other specified disorders of bone density and structure, right thigh: Secondary | ICD-10-CM | POA: Diagnosis not present

## 2021-05-20 DIAGNOSIS — E785 Hyperlipidemia, unspecified: Secondary | ICD-10-CM | POA: Diagnosis not present

## 2021-05-20 DIAGNOSIS — I739 Peripheral vascular disease, unspecified: Secondary | ICD-10-CM

## 2021-05-20 DIAGNOSIS — G5793 Unspecified mononeuropathy of bilateral lower limbs: Secondary | ICD-10-CM

## 2021-05-20 DIAGNOSIS — I2510 Atherosclerotic heart disease of native coronary artery without angina pectoris: Secondary | ICD-10-CM | POA: Diagnosis not present

## 2021-05-20 DIAGNOSIS — Z6835 Body mass index (BMI) 35.0-35.9, adult: Secondary | ICD-10-CM | POA: Insufficient documentation

## 2021-05-20 DIAGNOSIS — E66811 Obesity, class 1: Secondary | ICD-10-CM

## 2021-05-20 DIAGNOSIS — I700 Atherosclerosis of aorta: Secondary | ICD-10-CM | POA: Diagnosis not present

## 2021-05-20 DIAGNOSIS — I10 Essential (primary) hypertension: Secondary | ICD-10-CM

## 2021-05-20 DIAGNOSIS — K219 Gastro-esophageal reflux disease without esophagitis: Secondary | ICD-10-CM

## 2021-05-20 LAB — CBC WITH DIFFERENTIAL/PLATELET
Absolute Monocytes: 390 {cells}/uL (ref 200–950)
Basophils Absolute: 52 {cells}/uL (ref 0–200)
Basophils Relative: 1 %
Eosinophils Absolute: 270 {cells}/uL (ref 15–500)
Eosinophils Relative: 5.2 %
HCT: 39.5 % (ref 35.0–45.0)
Hemoglobin: 13.3 g/dL (ref 11.7–15.5)
Lymphs Abs: 1924 {cells}/uL (ref 850–3900)
MCH: 32.4 pg (ref 27.0–33.0)
MCHC: 33.7 g/dL (ref 32.0–36.0)
MCV: 96.1 fL (ref 80.0–100.0)
MPV: 10.8 fL (ref 7.5–12.5)
Monocytes Relative: 7.5 %
Neutro Abs: 2564 {cells}/uL (ref 1500–7800)
Neutrophils Relative %: 49.3 %
Platelets: 170 10*3/uL (ref 140–400)
RBC: 4.11 Million/uL (ref 3.80–5.10)
RDW: 12.7 % (ref 11.0–15.0)
Total Lymphocyte: 37 %
WBC: 5.2 10*3/uL (ref 3.8–10.8)

## 2021-05-20 LAB — COMPLETE METABOLIC PANEL WITHOUT GFR
AG Ratio: 1.8 (calc) (ref 1.0–2.5)
ALT: 14 U/L (ref 6–29)
AST: 17 U/L (ref 10–35)
Albumin: 4.2 g/dL (ref 3.6–5.1)
Alkaline phosphatase (APISO): 63 U/L (ref 37–153)
BUN: 15 mg/dL (ref 7–25)
CO2: 30 mmol/L (ref 20–32)
Calcium: 9.5 mg/dL (ref 8.6–10.4)
Chloride: 105 mmol/L (ref 98–110)
Creat: 0.94 mg/dL (ref 0.60–1.00)
Globulin: 2.4 g/dL (ref 1.9–3.7)
Glucose, Bld: 89 mg/dL (ref 65–99)
Potassium: 4.1 mmol/L (ref 3.5–5.3)
Sodium: 141 mmol/L (ref 135–146)
Total Bilirubin: 0.8 mg/dL (ref 0.2–1.2)
Total Protein: 6.6 g/dL (ref 6.1–8.1)
eGFR: 63 mL/min/{1.73_m2}

## 2021-05-20 LAB — LIPID PANEL
Cholesterol: 108 mg/dL
HDL: 61 mg/dL
LDL Cholesterol (Calc): 32 mg/dL
Non-HDL Cholesterol (Calc): 47 mg/dL
Total CHOL/HDL Ratio: 1.8 (calc)
Triglycerides: 72 mg/dL

## 2021-05-20 MED ORDER — ROSUVASTATIN CALCIUM 5 MG PO TABS: ORAL_TABLET | ORAL | 3 refills | Status: DC

## 2021-05-20 NOTE — Progress Notes (Signed)
Established Patient Office Visit  Subjective:  Patient ID: Dana Peters, female    DOB: Sep 25, 1945  Age: 76 y.o. MRN: [id number]  CC:  Chief Complaint  Patient presents with   Follow-up   Hypertension   Hyperlipidemia   Gastroesophageal Reflux    HPI Dana Peters presents for follow up on chronic medical conditions.   Hypertension: -Medications: None, used to be on medication in the past but discontinued because well controlled  -Checking BP at home (average): not checking  -Denies any SOB, CP, vision changes, LE edema or symptoms of hypotension  HLD/CAD: -Medications: Crestor 5, Zetia -Patient is compliant with above medications and reports no side effects.  -Last lipid panel: 2/22 Lipid Panel     Component Value Date/Time   CHOL 129 05/17/2020 0000   CHOL 177 05/07/2015 1131   TRIG 118 05/17/2020 0000   HDL 53 05/17/2020 0000   HDL 47 05/07/2015 1131   CHOLHDL 2.4 05/17/2020 0000   VLDL 39 (H) 10/26/2016 1123   LDLCALC 56 05/17/2020 0000   LABVLDL 40 05/07/2015 1131   Neuropathy: -Currently on Cymbalta 30 mg for bilateral neropathy in feet, symptoms well controlled   GERD: -Not currently on medication -Takes Nexium as needed, only has symptoms with certain foods   CKD2: -Last BMP 8/22 with creatinine 0.92, GFR 65  Vitamin D Deficiency: -Currently on supplementation 2000 IU daily   Health Maintenance: -Blood work due -Mammogram 10/22 Birads 1 -DEXA 10/21: t score -1.4 in right femur, -0.6 in lumbar spine -Colonoscopy 5/21, negative -Low dose CT lung - 9/22 Lung Rads-2   Past Medical History:  Diagnosis Date   Allergy    Baker's cyst of knee, right 06/04/2017   [name] Feb 2019   CKD (chronic kidney disease) stage 2, GFR 60-89 ml/min    Dyslipidemia    GERD (gastroesophageal reflux disease)    Hypertension    Lumbar spinal stenosis    Neuralgia neuritis, sciatic nerve 08/28/2012   Neuropathy    both feet   Obesity (BMI 30.0-34.9)  05/29/2016   Osteopenia 06/15/2016   March 2018   Personal history of tobacco use, presenting hazards to health 07/14/2015   Reflux    Sciatica    Thoracic aortic atherosclerosis ([place]) 08/01/2016   Chest CT   Venous stasis    Vitamin D deficiency disease    Wears dentures    Full upper, partial lower    Past Surgical History:  Procedure Laterality Date   ABDOMINAL HYSTERECTOMY     complete, due to heavy   CATARACT EXTRACTION, BILATERAL     COLONOSCOPY WITH PROPOFOL N/A 08/29/2019   Procedure: COLONOSCOPY WITH PROPOFOL;  Surgeon: [name], MD;  Location: [facility];  Service: Endoscopy;  Laterality: N/A;  priority 4   ECTOPIC PREGNANCY SURGERY      Family History  Problem Relation Age of Onset   Cancer Mother        colon   Hypertension Mother    Alzheimer's disease Mother    Cancer Father        prostate   Cancer Brother        lung   Stroke Brother    Cancer Brother        prostate and brain   Cancer Brother        throat   Diabetes Paternal Aunt    Breast cancer Paternal 36    Healthy Daughter    Cirrhosis Brother  Social History   Socioeconomic History   Marital status: Married    Spouse name: Not on file   Number of children: 1   Years of education: Not on file   Highest education level: 12th grade  Occupational History   Occupation: retired  Tobacco Use   Smoking status: Former    Packs/day: 1.00    Years: 30.00    Pack years: 30.00    Types: Cigarettes    Quit date: 10/13/2005    Years since quitting: 15.6   Smokeless tobacco: Former    Types: Snuff    Quit date: 10/09/2011  Vaping Use   Vaping Use: Never used  Substance and Sexual Activity   Alcohol use: No    Alcohol/week: 0.0 standard drinks   Drug use: No   Sexual activity: Yes  Other Topics Concern   Not on file  Social History Narrative   Lives with husband in a one story home.  Has one daughter.     Retired from [employer] work.     Education: high school.   Social  Determinants of Health   Financial Resource Strain: Low Risk    Difficulty of Paying Living Expenses: Not very hard  Food Insecurity: No Food Insecurity   Worried About Charity fundraiser in the Last Year: Never true   Ran Out of Food in the Last Year: Never true  Transportation Needs: No Transportation Needs   Lack of Transportation (Medical): No   Lack of Transportation (Non-Medical): No  Physical Activity: Inactive   Days of Exercise per Week: 0 days   Minutes of Exercise per Session: 0 min  Stress: No Stress Concern Present   Feeling of Stress : Only a little  Social Connections: Moderately Integrated   Frequency of Communication with Friends and Family: More than three times a week   Frequency of Social Gatherings with Friends and Family: Three times a week   Attends Religious Services: More than 4 times per year   Active Member of Clubs or Organizations: No   Attends Archivist Meetings: Never   Marital Status: Married  Human resources officer Violence: Not At Risk   Fear of Current or Ex-Partner: No   Emotionally Abused: No   Physically Abused: No   Sexually Abused: No    Outpatient Medications Prior to Visit  Medication Sig Dispense Refill   aspirin EC 81 MG tablet Take 1 tablet (81 mg total) by mouth daily. 30 tablet 11   benzonatate (TESSALON) 100 MG capsule Take 2 capsules (200 mg total) by mouth 3 (three) times daily as needed for cough. 20 capsule 0   Cholecalciferol (VITAMIN D) 2000 units CAPS Take by mouth daily.     DULoxetine (CYMBALTA) 30 MG capsule Take 1 capsule (30 mg total) by mouth daily. 90 capsule 3   ezetimibe (ZETIA) 10 MG tablet Take 1 tablet (10 mg total) by mouth daily. 90 tablet 3   loratadine (CLARITIN) 10 MG tablet Take 1 tablet (10 mg total) by mouth daily as needed. 90 tablet 3   predniSONE (DELTASONE) 10 MG tablet Day 1 take 6 pills, day 2 take 5 pills, day 3, take 4 pills, day 4 take 3 pills, day 5 take 2 pills, day 6 take 1 pill 21 tablet  0   rosuvastatin (CRESTOR) 5 MG tablet Take 5 mg po every other day at bedtime for atherosclerotic disease 45 tablet 3   No facility-administered medications prior to visit.    Allergies  Allergen Reactions   Atorvastatin Itching   Codeine Sulfate Itching   Pravastatin Nausea Only   Protonix [Pantoprazole Sodium] Other (See Comments)    Legs felt heavy   Tylenol With Codeine #3  [Acetaminophen-Codeine] Itching   Penicillin G Rash    ROS Review of Systems  Constitutional:  Negative for chills and fever.  Eyes:  Negative for visual disturbance.  Respiratory:  Negative for cough and shortness of breath.   Cardiovascular:  Negative for chest pain.  Gastrointestinal:  Negative for abdominal pain.  Neurological:  Negative for dizziness and headaches.     Objective:    Physical Exam Constitutional:      Appearance: Normal appearance.  HENT:     Head: Normocephalic and atraumatic.  Eyes:     Conjunctiva/sclera: Conjunctivae normal.  Cardiovascular:     Rate and Rhythm: Normal rate and regular rhythm.  Pulmonary:     Effort: Pulmonary effort is normal.     Breath sounds: Normal breath sounds.  Musculoskeletal:     Right lower leg: No edema.     Left lower leg: No edema.  Skin:    General: Skin is warm and dry.  Neurological:     General: No focal deficit present.     Mental Status: She is alert. Mental status is at baseline.  Psychiatric:        Mood and Affect: Mood normal.        Behavior: Behavior normal.    BP 134/72    Pulse 75    Temp 98 F (36.7 C) (Oral)    Resp 16    Ht 5' 4" (1.626 m)    Wt 197 lb 8 oz (89.6 kg)    LMP  (LMP Unknown)    SpO2 95%    BMI 33.90 kg/m  Wt Readings from Last 3 Encounters:  05/20/21 197 lb 8 oz (89.6 kg)  03/30/21 201 lb 12.8 oz (91.5 kg)  02/23/21 202 lb 12.8 oz (92 kg)     Health Maintenance Due  Topic Date Due   Zoster Vaccines- Shingrix (1 of 2) Never done   COVID-19 Vaccine (5 - Booster for Moderna series) 09/21/2020     There are no preventive care reminders to display for this patient.  Lab Results  Component Value Date   TSH 1.44 10/05/2016   Lab Results  Component Value Date   WBC 6.3 05/17/2020   HGB 13.8 05/17/2020   HCT 40.3 05/17/2020   MCV 94.2 05/17/2020   PLT 183 05/17/2020   Lab Results  Component Value Date   NA 141 11/16/2020   K 4.3 11/16/2020   CO2 28 11/16/2020   GLUCOSE 100 (H) 11/16/2020   BUN 14 11/16/2020   CREATININE 0.92 11/16/2020   BILITOT 0.6 05/17/2020   ALKPHOS 80 05/28/2017   AST 18 05/17/2020   ALT 19 05/17/2020   PROT 6.9 05/17/2020   ALBUMIN 4.1 05/28/2017   CALCIUM 9.3 11/16/2020   ANIONGAP 6 05/28/2017   EGFR 65 11/16/2020   Lab Results  Component Value Date   CHOL 129 05/17/2020   Lab Results  Component Value Date   HDL 53 05/17/2020   Lab Results  Component Value Date   LDLCALC 56 05/17/2020   Lab Results  Component Value Date   TRIG 118 05/17/2020   Lab Results  Component Value Date   CHOLHDL 2.4 05/17/2020   Lab Results  Component Value Date   HGBA1C 5.6 09/10/2019  Assessment & Plan:   Problem List Items Addressed This Visit       Cardiovascular and Mediastinum   Hypertension - Primary (Chronic)    Stable, BP good without medications, continue to recheck at follow ups.      Relevant Medications   rosuvastatin (CRESTOR) 5 MG tablet   Other Relevant Orders   CBC w/Diff/Platelet   COMPLETE METABOLIC PANEL WITH GFR   Coronary artery calcification seen on CT scan (Chronic)    Stable, on statin and aspirin, recheck lipid panel today.      Relevant Medications   rosuvastatin (CRESTOR) 5 MG tablet   PAD (peripheral artery disease) (HCC) (Chronic)   Relevant Medications   rosuvastatin (CRESTOR) 5 MG tablet   Thoracic aortic atherosclerosis (HCC)   Relevant Medications   rosuvastatin (CRESTOR) 5 MG tablet     Digestive   GERD (gastroesophageal reflux disease)    Stable, continue to avoid triggering foods.         Nervous and Auditory   Neuropathy of both feet (Chronic)    Stable, doing well on Cymbalta 30.        Musculoskeletal and Integument   Osteopenia    Reviewed last DEXA in 10/21, plan to repeat this fall. Continue walking and Vitamin D supplements.        Genitourinary   CKD (chronic kidney disease) stage 2, GFR 60-89 ml/min (Chronic)    Stable, last GFR 65, recheck labs today. Continue to stay well hydrated, avoid NSAIDs and other nephrotoxic agents.         Other   Dyslipidemia (Chronic)    Recheck lipid panel today, continue statin and Zetia.      Relevant Medications   rosuvastatin (CRESTOR) 5 MG tablet   Other Relevant Orders   Lipid Profile   Vitamin D deficiency disease    Currently on supplements, 2000 IU daily.       Obesity (BMI 30.0-34.9)    Has recently lost about 6 pounds by walking more, congratulated efforts and recommend continuing regular exercise.       Class 2 severe obesity with serious comorbidity and body mass index (BMI) of 35.0 to 35.9 in adult, unspecified obesity type ([place])   Other Visit Diagnoses     Vaccine for streptococcus pneumoniae and influenza       Relevant Orders   Pneumococcal conjugate vaccine 20-valent (Prevnar 20) (Completed)       Meds ordered this encounter  Medications   rosuvastatin (CRESTOR) 5 MG tablet    Sig: Take 5 mg po every other day at bedtime for atherosclerotic disease    Dispense:  45 tablet    Refill:  3    Trial of crestor - prior itching with lipitor and nausea with pravastatin    Follow-up: Return in about 6 months (around 11/17/2021).    Teodora Medici, DO

## 2021-05-20 NOTE — Assessment & Plan Note (Signed)
Stable, on statin and aspirin, recheck lipid panel today.

## 2021-05-20 NOTE — Assessment & Plan Note (Signed)
Stable, last GFR 65, recheck labs today. Continue to stay well hydrated, avoid NSAIDs and other nephrotoxic agents.

## 2021-05-20 NOTE — Assessment & Plan Note (Signed)
Recheck lipid panel today, continue statin and Zetia.

## 2021-05-20 NOTE — Assessment & Plan Note (Signed)
Stable, continue to avoid triggering foods.

## 2021-05-20 NOTE — Assessment & Plan Note (Signed)
Has recently lost about 6 pounds by walking more, congratulated efforts and recommend continuing regular exercise.

## 2021-05-20 NOTE — Assessment & Plan Note (Signed)
Currently on supplements, 2000 IU daily.

## 2021-05-20 NOTE — Assessment & Plan Note (Signed)
Stable, doing well on Cymbalta 30.

## 2021-05-20 NOTE — Assessment & Plan Note (Signed)
Stable, BP good without medications, continue to recheck at follow ups.

## 2021-05-20 NOTE — Assessment & Plan Note (Signed)
Reviewed last DEXA in 10/21, plan to repeat this fall. Continue walking and Vitamin D supplements.

## 2021-05-20 NOTE — Patient Instructions (Signed)
It was great seeing you today! ? ?Plan discussed at today's visit: ?-Blood work ordered today, results will be uploaded to MyChart.  ?-Refills sent to pharmacy  ?-Pneumonia vaccine today ? ?Follow up in: 6 months  ? ?Take care and let us know if you have any questions or concerns prior to your next visit. ? ?Dr. Kellis Topete ? ?

## 2021-06-02 DIAGNOSIS — G629 Polyneuropathy, unspecified: Secondary | ICD-10-CM | POA: Diagnosis not present

## 2021-06-02 DIAGNOSIS — J301 Allergic rhinitis due to pollen: Secondary | ICD-10-CM | POA: Diagnosis not present

## 2021-06-02 DIAGNOSIS — Z008 Encounter for other general examination: Secondary | ICD-10-CM | POA: Diagnosis not present

## 2021-06-02 DIAGNOSIS — Z87891 Personal history of nicotine dependence: Secondary | ICD-10-CM | POA: Diagnosis not present

## 2021-06-02 DIAGNOSIS — R030 Elevated blood-pressure reading, without diagnosis of hypertension: Secondary | ICD-10-CM | POA: Diagnosis not present

## 2021-06-02 DIAGNOSIS — Z833 Family history of diabetes mellitus: Secondary | ICD-10-CM | POA: Diagnosis not present

## 2021-06-02 DIAGNOSIS — Z6830 Body mass index (BMI) 30.0-30.9, adult: Secondary | ICD-10-CM | POA: Diagnosis not present

## 2021-06-02 DIAGNOSIS — Z7982 Long term (current) use of aspirin: Secondary | ICD-10-CM | POA: Diagnosis not present

## 2021-06-02 DIAGNOSIS — Z8249 Family history of ischemic heart disease and other diseases of the circulatory system: Secondary | ICD-10-CM | POA: Diagnosis not present

## 2021-06-02 DIAGNOSIS — E785 Hyperlipidemia, unspecified: Secondary | ICD-10-CM | POA: Diagnosis not present

## 2021-06-02 DIAGNOSIS — E669 Obesity, unspecified: Secondary | ICD-10-CM | POA: Diagnosis not present

## 2021-06-02 DIAGNOSIS — Z809 Family history of malignant neoplasm, unspecified: Secondary | ICD-10-CM | POA: Diagnosis not present

## 2021-06-21 ENCOUNTER — Encounter: Payer: Self-pay | Admitting: Family Medicine

## 2021-06-27 ENCOUNTER — Ambulatory Visit: Payer: Self-pay

## 2021-06-27 NOTE — Telephone Encounter (Signed)
?  Chief Complaint: numbness ?Symptoms: numbness and tingling in feet and swelling ?Frequency: gotten worse in 1-2 weeks  ?Pertinent Negatives: NA ?Disposition: [] ED /[] Urgent Care (no appt availability in office) / [x] Appointment(In office/virtual)/ []  [facility]/ [] Home Care/ [] Refused Recommended Disposition /[] [facility]/ []  Follow-up with PCP ?Additional Notes: Pt states this has been going on for years but gotten worse and esp when she puts on shoes is when she notices the swelling and numbness feeling worse ? ? ?Summary: numbness and tingling in toes  ? Patient called in to say that she have been having numbness and tingling in her toes on and off for a long while now but for the last 3 to 4 days it seem to be happening more. Say that her toes feel like they swell after putting on her shoes and then causing this issue. Asking for nurse to call since she don't know if she should be concerned Ph# [phone]   ?  ? ?Reason for Disposition ? [1] Numbness or tingling in one or both feet AND [2] is a chronic symptom (recurrent or ongoing AND present > 4 weeks) ? ?Answer Assessment - Initial Assessment Questions ?1. SYMPTOM: "What is the main symptom you are concerned about?" (e.g., weakness, numbness) ?    Numbness and tingling in feet ?2. ONSET: "When did this start?" (minutes, hours, days; while sleeping) ?    Been going on for years but gotten worse in 1-2 weeks  ?4. PATTERN "Does this come and go, or has it been constant since it started?"  "Is it present now?" ?    Comes and goes  ?6. NEUROLOGIC SYMPTOMS: "Have you had any of the following symptoms: headache, dizziness, vision loss, double vision, changes in speech, unsteady on your feet?" ?    No ?7. OTHER SYMPTOMS: "Do you have any other symptoms?" ?    Swelling in feet ? ?Protocols used: Neurologic Deficit-A-AH ? ?

## 2021-06-28 ENCOUNTER — Ambulatory Visit (INDEPENDENT_AMBULATORY_CARE_PROVIDER_SITE_OTHER): Payer: Medicare HMO | Admitting: Family Medicine

## 2021-06-28 ENCOUNTER — Encounter: Payer: Self-pay | Admitting: Family Medicine

## 2021-06-28 VITALS — BP 124/76 | HR 82 | Temp 98.3°F | Resp 16 | Ht 68.0 in | Wt 197.5 lb

## 2021-06-28 DIAGNOSIS — M79671 Pain in right foot: Secondary | ICD-10-CM

## 2021-06-28 DIAGNOSIS — M79672 Pain in left foot: Secondary | ICD-10-CM | POA: Diagnosis not present

## 2021-06-28 DIAGNOSIS — G5793 Unspecified mononeuropathy of bilateral lower limbs: Secondary | ICD-10-CM

## 2021-06-28 MED ORDER — DULOXETINE HCL 30 MG PO CPEP: 30.0000 mg | ORAL_CAPSULE | Freq: Two times a day (BID) | ORAL | 3 refills | Status: DC

## 2021-06-28 NOTE — Patient Instructions (Signed)
Start taking cymbalta/duloxetine twice a day and see how that helps with your foot and nerve pain symptoms at night. ? ?In 2 weeks let us know how your symptoms are. ? ? ?

## 2021-06-28 NOTE — Progress Notes (Signed)
 "   Patient ID: Dana Peters, female    DOB: 1946-03-26, 76 y.o.   MRN: [id number]  PCP: Leavy Mole, PA-C  Chief Complaint  Patient presents with   Numbness    On both feet mainly around toes, pt states has felt them before years back go numb but this time is happening more frequent and concerned.    Subjective:   MAYARA PAULSON is a 76 y.o. female, presents to clinic with CC of the following:  HPI  Patient presents with worsening foot and toe pain and neuropathy.  Her symptoms began many years ago and she was previously referred to neurology in 2016 and 2017.  Neurology work-up diagnosed her with idiopathic neuropathy and she was managed on gabapentin  and then was switched to Cymbalta .  She currently takes 30 mg Cymbalta  once daily in the morning.  She states for the last couple weeks her nerve pain is much worse that she is having pain under the ball of her foot extending into her toes her symptoms are much worse at night and first in the morning and then they improve throughout the day as she gets moving.  She has a history of lower extremity edema and ankle edema that has been much better but she is experiencing pedal edema throughout the day when up and on her feet. Other than the dependent edema throughout the day she has not noted any changes to her skin or nails.  No wounds or rashes she has history of PAD and she sometimes wears compression hose or socks.   Labs were done about a month ago with a routine office visit.  No electrolyte abnormality, no anemia.  She has no history of iron deficiency hypothyroid or B12 deficiency   Patient Active Problem List   Diagnosis Date Noted   Class 2 severe obesity with serious comorbidity and body mass index (BMI) of 35.0 to 35.9 in adult, unspecified obesity type (HCC) 05/20/2021   Lymphedema 02/26/2020   Personal history of colonic polyps    Bilateral lower extremity edema 09/27/2017   Emphysema of lung (HCC) 08/14/2017    Ganglion cyst of finger 06/25/2017   Full code status 06/25/2017   Osteoarthritis of knee 06/12/2017   Baker's cyst of knee, right 06/04/2017   Thoracic aortic atherosclerosis (HCC) 08/01/2016   Osteopenia 06/15/2016   Obesity (BMI 30.0-34.9) 05/29/2016   Urinary hesitancy 02/14/2016   PAD (peripheral artery disease) (HCC) 09/09/2015   Coronary artery calcification seen on CT scan 07/20/2015   Personal history of tobacco use, presenting hazards to health 07/14/2015   Foraminal stenosis of lumbar region 01/28/2015   Facet arthropathy, lumbar 01/28/2015   Spinal stenosis of lumbar region 01/28/2015   Neuropathy of both feet 01/07/2015   Leg pain, bilateral 01/07/2015   Leg heaviness 01/07/2015   CKD (chronic kidney disease) stage 2, GFR 60-89 ml/min 10/17/2014   Allergic rhinitis 10/14/2014   Dyslipidemia 10/14/2014   Hypertension    Vitamin D  deficiency disease    GERD (gastroesophageal reflux disease) 02/02/2014      Current Outpatient Medications:    aspirin  EC 81 MG tablet, Take 1 tablet (81 mg total) by mouth daily., Disp: 30 tablet, Rfl: 11   Cholecalciferol (VITAMIN D ) 2000 units CAPS, Take by mouth daily., Disp: , Rfl:    DULoxetine  (CYMBALTA ) 30 MG capsule, Take 1 capsule (30 mg total) by mouth daily., Disp: 90 capsule, Rfl: 3   ezetimibe  (ZETIA ) 10 MG tablet, Take 1 tablet (10  mg total) by mouth daily., Disp: 90 tablet, Rfl: 3   loratadine  (CLARITIN ) 10 MG tablet, Take 1 tablet (10 mg total) by mouth daily as needed., Disp: 90 tablet, Rfl: 3   rosuvastatin  (CRESTOR ) 5 MG tablet, Take 5 mg po every other day at bedtime for atherosclerotic disease, Disp: 45 tablet, Rfl: 3   Allergies  Allergen Reactions   Atorvastatin  Itching   Codeine Sulfate Itching   Pravastatin  Nausea Only   Protonix [Pantoprazole Sodium] Other (See Comments)    Legs felt heavy   Tylenol With Codeine #3  [Acetaminophen-Codeine] Itching   Penicillin G Rash     Social History   Tobacco Use    Smoking status: Former    Packs/day: 1.00    Years: 30.00    Pack years: 30.00    Types: Cigarettes    Quit date: 10/13/2005    Years since quitting: 15.7   Smokeless tobacco: Former    Types: Snuff    Quit date: 10/09/2011  Vaping Use   Vaping Use: Never used  Substance Use Topics   Alcohol use: No    Alcohol/week: 0.0 standard drinks   Drug use: No      Chart Review Today: I personally reviewed active problem list, medication list, allergies, family history, social history, health maintenance, notes from last encounter, lab results, imaging with the patient/caregiver today.   Review of Systems  Constitutional: Negative.   HENT: Negative.    Eyes: Negative.   Respiratory: Negative.    Cardiovascular: Negative.   Gastrointestinal: Negative.   Endocrine: Negative.   Genitourinary: Negative.   Musculoskeletal: Negative.   Skin: Negative.   Allergic/Immunologic: Negative.   Neurological: Negative.   Hematological: Negative.   Psychiatric/Behavioral: Negative.    All other systems reviewed and are negative.     Objective:   Vitals:   06/28/21 1341  BP: 124/76  Pulse: 82  Resp: 16  Temp: 98.3 F (36.8 C)  TempSrc: Oral  SpO2: 96%  Weight: 197 lb 8 oz (89.6 kg)  Height: 5' 8 (1.727 m)    Body mass index is 30.03 kg/m.  Physical Exam Vitals and nursing note reviewed.  Constitutional:      General: She is not in acute distress.    Appearance: She is obese. She is not ill-appearing, toxic-appearing or diaphoretic.  HENT:     Head: Normocephalic and atraumatic.  Cardiovascular:     Rate and Rhythm: Normal rate.     Pulses: Normal pulses.          Dorsalis pedis pulses are 2+ on the right side and 2+ on the left side.       Posterior tibial pulses are 2+ on the right side and 2+ on the left side.  Pulmonary:     Breath sounds: Normal breath sounds.  Musculoskeletal:     Right lower leg: No edema.     Left lower leg: No edema.     Right foot: Normal range  of motion and normal capillary refill. No swelling, deformity, tenderness, bony tenderness or crepitus. Normal pulse.     Left foot: Normal range of motion and normal capillary refill. No swelling, deformity, tenderness, bony tenderness or crepitus. Normal pulse.  Feet:     Right foot:     Skin integrity: Skin integrity normal. No ulcer, blister, skin breakdown, erythema, warmth or fissure.     Toenail Condition: Right toenails are abnormally thick.     Left foot:     Skin  integrity: Skin integrity normal. No ulcer, blister, skin breakdown, erythema, warmth or fissure.     Toenail Condition: Left toenails are abnormally thick.     Comments: Normal monofilament testing bilaterally Skin:    General: Skin is warm and dry.     Capillary Refill: Capillary refill takes less than 2 seconds.     Coloration: Skin is not jaundiced or pale.     Findings: No bruising, erythema, lesion or rash.  Neurological:     Mental Status: She is alert.     Gait: Gait normal.  Psychiatric:        Mood and Affect: Mood normal.        Behavior: Behavior normal.     Results for orders placed or performed in visit on 05/20/21  CBC w/Diff/Platelet  Result Value Ref Range   WBC 5.2 3.8 - 10.8 Thousand/uL   RBC 4.11 3.80 - 5.10 Million/uL   Hemoglobin 13.3 11.7 - 15.5 g/dL   HCT 60.4 64.9 - 54.9 %   MCV 96.1 80.0 - 100.0 fL   MCH 32.4 27.0 - 33.0 pg   MCHC 33.7 32.0 - 36.0 g/dL   RDW 87.2 88.9 - 84.9 %   Platelets 170 140 - 400 Thousand/uL   MPV 10.8 7.5 - 12.5 fL   Neutro Abs 2,564 1,500 - 7,800 cells/uL   Lymphs Abs 1,924 850 - 3,900 cells/uL   Absolute Monocytes 390 200 - 950 cells/uL   Eosinophils Absolute 270 15 - 500 cells/uL   Basophils Absolute 52 0 - 200 cells/uL   Neutrophils Relative % 49.3 %   Total Lymphocyte 37.0 %   Monocytes Relative 7.5 %   Eosinophils Relative 5.2 %   Basophils Relative 1.0 %  COMPLETE METABOLIC PANEL WITH GFR  Result Value Ref Range   Glucose, Bld 89 65 - 99 mg/dL    BUN 15 7 - 25 mg/dL   Creat 9.05 9.39 - 8.99 mg/dL   eGFR 63 > OR = 60 fO/fpw/8.26f7   BUN/Creatinine Ratio NOT APPLICABLE 6 - 22 (calc)   Sodium 141 135 - 146 mmol/L   Potassium 4.1 3.5 - 5.3 mmol/L   Chloride 105 98 - 110 mmol/L   CO2 30 20 - 32 mmol/L   Calcium  9.5 8.6 - 10.4 mg/dL   Total Protein 6.6 6.1 - 8.1 g/dL   Albumin 4.2 3.6 - 5.1 g/dL   Globulin 2.4 1.9 - 3.7 g/dL (calc)   AG Ratio 1.8 1.0 - 2.5 (calc)   Total Bilirubin 0.8 0.2 - 1.2 mg/dL   Alkaline phosphatase (APISO) 63 37 - 153 U/L   AST 17 10 - 35 U/L   ALT 14 6 - 29 U/L  Lipid Profile  Result Value Ref Range   Cholesterol 108 <200 mg/dL   HDL 61 > OR = 50 mg/dL   Triglycerides 72 <849 mg/dL   LDL Cholesterol (Calc) 32 mg/dL (calc)   Total CHOL/HDL Ratio 1.8 <5.0 (calc)   Non-HDL Cholesterol (Calc) 47 <869 mg/dL (calc)       Assessment & Plan:     ICD-10-CM   1. Neuropathy of both feet  G57.93 DULoxetine  (CYMBALTA ) 30 MG capsule    Ambulatory referral to Podiatry   Increased dose of Cymbalta  to twice daily, follow-up in 2 weeks to see if symptoms are better managed    2. Foot pain, bilateral  M79.671 Ambulatory referral to Podiatry   M79.672    Some plantar foot pain, swelling throughout the day,  discomfort overnight and forcing in the morning     I reviewed her recent labs from less than a month ago and past labs with did not show any deficiencies related to neuropathy did not feel that her symptoms warranted any repeated labs today We will try to do twice daily dosing for Cymbalta  first and if that is not effective we can then change her medications possibly to pregabalin ?   She has no history of diabetes, she has PAD but pulses were palpable and there was no signs of ischemia.  We will do a 2-week telephone follow-up to reassess her symptoms  She has previously asked for a podiatry referral for difficulty with her foot wear, pain in feet throughout the day, she does have thickened toenails - put  in referral for consult per her request.   Michelene Cower, PA-C 06/28/21 2:03 PM  "

## 2021-07-05 ENCOUNTER — Other Ambulatory Visit: Payer: Self-pay

## 2021-07-05 ENCOUNTER — Encounter: Payer: Self-pay | Admitting: Podiatry

## 2021-07-05 ENCOUNTER — Ambulatory Visit: Payer: Medicare HMO | Admitting: Podiatry

## 2021-07-05 DIAGNOSIS — G629 Polyneuropathy, unspecified: Secondary | ICD-10-CM | POA: Diagnosis not present

## 2021-07-05 NOTE — Progress Notes (Signed)
? ?  HPI: 76 y.o. female presenting today as a new patient for evaluation of numbness and tingling to the bilateral feet this been going on off and on for years.  Over the past 4 weeks it has become increasingly symptomatic.  She presents for further treatment and evaluation ? ?Past Medical History:  ?Diagnosis Date  ? Allergy   ? Baker's cyst of knee, right 06/04/2017  ? [name] Feb 2019  ? CKD (chronic kidney disease) stage 2, GFR 60-89 ml/min   ? Dyslipidemia   ? GERD (gastroesophageal reflux disease)   ? Hypertension   ? Lumbar spinal stenosis   ? Neuralgia neuritis, sciatic nerve 08/28/2012  ? Neuropathy   ? both feet  ? Obesity (BMI 30.0-34.9) 05/29/2016  ? Osteopenia 06/15/2016  ? March 2018  ? Personal history of tobacco use, presenting hazards to health 07/14/2015  ? Reflux   ? Sciatica   ? Thoracic aortic atherosclerosis ([place]) 08/01/2016  ? Chest CT  ? Venous stasis   ? Vitamin D deficiency disease   ? Wears dentures   ? Full upper, partial lower  ? ? ?Past Surgical History:  ?Procedure Laterality Date  ? ABDOMINAL HYSTERECTOMY    ? complete, due to heavy  ? CATARACT EXTRACTION, BILATERAL    ? COLONOSCOPY WITH PROPOFOL N/A 08/29/2019  ? Procedure: COLONOSCOPY WITH PROPOFOL;  Surgeon: [name], MD;  Location: [facility];  Service: Endoscopy;  Laterality: N/A;  priority 4  ? ECTOPIC PREGNANCY SURGERY    ? ? ?Allergies  ?Allergen Reactions  ? Atorvastatin Itching  ? Codeine Sulfate Itching  ? Pravastatin Nausea Only  ? Protonix [Pantoprazole Sodium] Other (See Comments)  ?  Legs felt heavy  ? Tylenol With Codeine #3  [Acetaminophen-Codeine] Itching  ? Penicillin G Rash  ? ?  ?Physical Exam: ?General: The patient is alert and oriented x3 in no acute distress. ? ?Dermatology: Skin is warm, dry and supple bilateral lower extremities. Negative for open lesions or macerations. ? ?Vascular: Palpable pedal pulses bilaterally. Capillary refill within normal limits.  Negative for any significant edema or  erythema ? ?Neurological: Light touch and protective threshold grossly intact.  Paresthesia with loss of sensation noted to the bilateral forefoot and digits ? ?Musculoskeletal Exam: No pedal deformities noted ? ?Assessment: ?1.  Peripheral polyneuropathy bilateral forefoot ?2.  History of lumbar stenosis ? ? ?Plan of Care:  ?1. Patient evaluated. X-Rays reviewed.  ?2.  Continue medication management with PCP ?3.  I did explain to the patient that she does have a history of lumbar stenosis which may be causing some of the numbness in her feet.  Recommend follow-up with neuro spine if condition worsens ?4.  Return to clinic as needed ? ?  ?  ?Edrick Kins, DPM ?[place] ? ?Dr. Edrick Kins, DPM  ?  ?[address]                                        ?[place] [address]                ?Office [phone]  ?Fax [phone] ? ? ? ? ?

## 2021-07-15 ENCOUNTER — Telehealth (INDEPENDENT_AMBULATORY_CARE_PROVIDER_SITE_OTHER): Payer: Medicare HMO | Admitting: Family Medicine

## 2021-07-15 ENCOUNTER — Encounter: Payer: Self-pay | Admitting: Family Medicine

## 2021-07-15 DIAGNOSIS — G5793 Unspecified mononeuropathy of bilateral lower limbs: Secondary | ICD-10-CM

## 2021-07-15 DIAGNOSIS — G609 Hereditary and idiopathic neuropathy, unspecified: Secondary | ICD-10-CM

## 2021-07-15 NOTE — Progress Notes (Signed)
? ?Name: Dana Peters   MRN: [id number]    DOB: 06-30-1945   Date:07/15/2021 ? ?     Progress Note ? ?Subjective:  ? ? ?Chief Complaint ? ?Chief Complaint  ?Patient presents with  ? Follow-up  ? Numbness  ?  Pt has been soaking feet in hot/cold water and has helped with her numbness. Pt states seen podiatry and told her she needed to see a back doctor.  ? ? ?I connected with  Benay Pike  on 07/15/21 at 10:20 AM EDT by a video enabled telemedicine application and verified that I am speaking with the correct person using two identifiers.  I discussed the limitations of evaluation and management by telemedicine and the availability of in person appointments. The patient expressed understanding and agreed to proceed. Staff also discussed with the patient that there may be a patient responsible charge related to this service. ?Patient Location: home  ?Provider Location: [facility] ?Additional Individuals present: none ?HPI ? ?Pt presents for f/up on bilateral foot neuropathy after med dose increase and podiatry consult ?Recently seen - see HPI A&P of recent pertinent visits, reviewed by me today  ? ? ?Patient Active Problem List  ? Diagnosis Date Noted  ? Class 2 severe obesity with serious comorbidity and body mass index (BMI) of 35.0 to 35.9 in adult, unspecified obesity type ([place]) 05/20/2021  ? Lymphedema 02/26/2020  ? Personal history of colonic polyps   ? Bilateral lower extremity edema 09/27/2017  ? Emphysema of lung ([place]) 08/14/2017  ? Ganglion cyst of finger 06/25/2017  ? Full code status 06/25/2017  ? Osteoarthritis of knee 06/12/2017  ? Baker's cyst of knee, right 06/04/2017  ? Thoracic aortic atherosclerosis ([place]) 08/01/2016  ? Osteopenia 06/15/2016  ? Obesity (BMI 30.0-34.9) 05/29/2016  ? Urinary hesitancy 02/14/2016  ? PAD (peripheral artery disease) ([place]) 09/09/2015  ? Coronary artery calcification seen on CT scan 07/20/2015  ? Personal history of tobacco use, presenting hazards to health  07/14/2015  ? Foraminal stenosis of lumbar region 01/28/2015  ? Facet arthropathy, lumbar 01/28/2015  ? Spinal stenosis of lumbar region 01/28/2015  ? Neuropathy of both feet 01/07/2015  ? Leg pain, bilateral 01/07/2015  ? Leg heaviness 01/07/2015  ? CKD (chronic kidney disease) stage 2, GFR 60-89 ml/min 10/17/2014  ? Allergic rhinitis 10/14/2014  ? Dyslipidemia 10/14/2014  ? Hypertension   ? Vitamin D deficiency disease   ? GERD (gastroesophageal reflux disease) 02/02/2014  ? ? ?Social History  ? ?Tobacco Use  ? Smoking status: Former  ?  Packs/day: 1.00  ?  Years: 30.00  ?  Pack years: 30.00  ?  Types: Cigarettes  ?  Quit date: 10/13/2005  ?  Years since quitting: 15.7  ? Smokeless tobacco: Former  ?  Types: Snuff  ?  Quit date: 10/09/2011  ?Substance Use Topics  ? Alcohol use: No  ?  Alcohol/week: 0.0 standard drinks  ? ? ? ?Current Outpatient Medications:  ?  aspirin EC 81 MG tablet, Take 1 tablet (81 mg total) by mouth daily., Disp: 30 tablet, Rfl: 11 ?  Cholecalciferol (VITAMIN D) 2000 units CAPS, Take by mouth daily., Disp: , Rfl:  ?  DULoxetine (CYMBALTA) 30 MG capsule, Take 1 capsule (30 mg total) by mouth 2 (two) times daily., Disp: 90 capsule, Rfl: 3 ?  ezetimibe (ZETIA) 10 MG tablet, Take 1 tablet (10 mg total) by mouth daily., Disp: 90 tablet, Rfl: 3 ?  loratadine (CLARITIN) 10 MG tablet, Take 1  tablet (10 mg total) by mouth daily as needed., Disp: 90 tablet, Rfl: 3 ?  rosuvastatin (CRESTOR) 5 MG tablet, Take 5 mg po every other day at bedtime for atherosclerotic disease, Disp: 45 tablet, Rfl: 3 ? ?Allergies  ?Allergen Reactions  ? Atorvastatin Itching  ? Codeine Sulfate Itching  ? Pravastatin Nausea Only  ? Protonix [Pantoprazole Sodium] Other (See Comments)  ?  Legs felt heavy  ? Tylenol With Codeine #3  [Acetaminophen-Codeine] Itching  ? Penicillin G Rash  ? ? ?I personally reviewed active problem list, medication list, allergies, family history, social history, health maintenance, notes from last  encounter, lab results, imaging with the patient/caregiver today. ? ? ?Review of Systems  ?Constitutional: Negative.   ?HENT: Negative.    ?Eyes: Negative.   ?Respiratory: Negative.    ?Cardiovascular: Negative.   ?Gastrointestinal: Negative.   ?Endocrine: Negative.   ?Genitourinary: Negative.   ?Musculoskeletal: Negative.   ?Skin: Negative.   ?Allergic/Immunologic: Negative.   ?Neurological: Negative.   ?Hematological: Negative.   ?Psychiatric/Behavioral: Negative.    ?All other systems reviewed and are negative.  ? ? ?Objective:  ? ?Virtual encounter, vitals limited, only able to obtain the following ?There were no vitals filed for this visit. ?There is no height or weight on file to calculate BMI. ?Nursing Note and Vital Signs reviewed. ? ?Physical Exam ?Vitals and nursing note reviewed.  ?Constitutional:   ?   General: She is not in acute distress. ?   Appearance: She is not ill-appearing, toxic-appearing or diaphoretic.  ?   Comments: Well appearing, normal phonation ?Mood good  ?Neurological:  ?   Mental Status: She is alert.  ? ? ?PE limited by virtual encounter ? ?No results found for this or any previous visit (from the past 72 hour(s)). ? ?Assessment and Plan:  ? ?  ICD-10-CM   ?1. Neuropathy of both feet  G57.93   ? is worsening can get back to neurology or if low back/radicular sx are suspicious we can do screening exam/xray and refer to spine specialist  ?  ?2. Idiopathic peripheral neuropathy  G60.9   ? previously worked up by neuro and dx in 2016/2017, rechecked for deficiencies this past year, gradually worsening sx  ?  ?Pt has increased cymbalta and f/up with podiatrist - podiatry said there was nothing else for him to do and she needed a back specialist ?Her nerve pain is better controlled now and less severe and she denies any hx of back pain, denies LE weakness, difficulty with ambulation or ROM - does not want to see a specialist right now but if sx worsens she was advised to get a f/up appt for  reexamination and referral.  ? ?Management - continue cymbalta BID, she has not had any SE or concerns with this ? ?-Red flags and when to present for emergency care or RTC including new/worsening/un-resolving symptoms, reviewed with patient at time of visit. Follow up and care instructions discussed and provided in AVS. ?- I discussed the assessment and treatment plan with the patient. The patient was provided an opportunity to ask questions and all were answered. The patient agreed with the plan and demonstrated an understanding of the instructions. ? ?I provided 15+ minutes of non-face-to-face time during this encounter. ? ?Delsa Grana, PA-C ?07/15/21 10:22 AM ? ? ? ? ?

## 2021-07-19 ENCOUNTER — Other Ambulatory Visit: Payer: Self-pay

## 2021-07-19 ENCOUNTER — Telehealth: Payer: Self-pay

## 2021-07-19 DIAGNOSIS — G5793 Unspecified mononeuropathy of bilateral lower limbs: Secondary | ICD-10-CM

## 2021-07-19 DIAGNOSIS — G8929 Other chronic pain: Secondary | ICD-10-CM

## 2021-07-19 DIAGNOSIS — M549 Dorsalgia, unspecified: Secondary | ICD-10-CM

## 2021-07-19 NOTE — Telephone Encounter (Signed)
Copied from CRM [id number]. Topic: Referral - Request for Referral >> Jul 19, 2021  9:29 AM Traci Sermon wrote: Has patient seen PCP for this complaint? Yes.    Referral for which specialty: Spine Specialist  Preferred provider/office: Any  Reason for referral: Back Pain

## 2021-07-19 NOTE — Telephone Encounter (Signed)
Would this referral be placed under Spine Surgery? It's the only option that shows for a spine specialist on my behalf. ?

## 2021-07-21 NOTE — Telephone Encounter (Signed)
Referral has been placed. 

## 2021-07-22 ENCOUNTER — Encounter: Payer: Self-pay | Admitting: Family Medicine

## 2021-07-22 ENCOUNTER — Ambulatory Visit: Payer: Self-pay | Admitting: *Deleted

## 2021-07-22 ENCOUNTER — Ambulatory Visit (INDEPENDENT_AMBULATORY_CARE_PROVIDER_SITE_OTHER): Payer: Medicare HMO | Admitting: Family Medicine

## 2021-07-22 VITALS — BP 140/76 | HR 85 | Temp 98.1°F | Resp 16 | Ht 68.0 in | Wt 201.4 lb

## 2021-07-22 DIAGNOSIS — M545 Low back pain, unspecified: Secondary | ICD-10-CM | POA: Diagnosis not present

## 2021-07-22 MED ORDER — MELOXICAM 15 MG PO TABS: 15.0000 mg | ORAL_TABLET | Freq: Every day | ORAL | 1 refills | Status: DC

## 2021-07-22 NOTE — Progress Notes (Signed)
? ? ?Patient ID: Dana Peters, female    DOB: 11-26-1945, 76 y.o.   MRN: [id number] ? ?PCP: Delsa Grana, PA-C ? ?Chief Complaint  ?Patient presents with  ? Back Pain  ?  Lower left side since Tuesday and it has got worst, pt denies injuring it but has been picking up some chairs.  ? ? ?Subjective:  ? ?Dana Peters is a 76 y.o. female, presents to clinic with CC of the following: ? ?Acute low back pain onset 3 d ago ?Back Pain ?This is a new problem. Episode onset: 3 d. The problem has been gradually worsening since onset. The pain is present in the sacro-iliac. The quality of the pain is described as stabbing. The pain does not radiate. The pain is at a severity of 9/10. The pain is The same all the time. The symptoms are aggravated by position and standing (walking, getting up from sitting). Pertinent negatives include no abdominal pain, numbness, paresis, paresthesias, perianal numbness, tingling or weakness. Risk factors include obesity, lack of exercise, menopause and sedentary lifestyle. Treatments tried: tylenol. The treatment provided mild relief.   ? ?Reviewed prior imaging, no specialists consult in the past several years ?IMPRESSION: ?1. There is grade 1 anterolisthesis of L5 on S1 secondary to severe ?bilateral facet arthropathy. There is severe left foraminal ?stenosis. There is a left lateral disc osteophyte complex. ?2. At L4-5 there is a mild broad-based disc bulge with severe right ?and moderate left facet arthropathy. Mild spinal stenosis. Right ?lateral recess stenosis. ?3. At L3-4 there is a mild broad-based disc bulge and mild bilateral ?facet arthropathy. ? ? ?Patient Active Problem List  ? Diagnosis Date Noted  ? Class 2 severe obesity with serious comorbidity and body mass index (BMI) of 35.0 to 35.9 in adult, unspecified obesity type ([place]) 05/20/2021  ? Lymphedema 02/26/2020  ? Personal history of colonic polyps   ? Bilateral lower extremity edema 09/27/2017  ? Emphysema of lung  ([place]) 08/14/2017  ? Ganglion cyst of finger 06/25/2017  ? Full code status 06/25/2017  ? Osteoarthritis of knee 06/12/2017  ? Baker's cyst of knee, right 06/04/2017  ? Thoracic aortic atherosclerosis ([place]) 08/01/2016  ? Osteopenia 06/15/2016  ? Obesity (BMI 30.0-34.9) 05/29/2016  ? Urinary hesitancy 02/14/2016  ? PAD (peripheral artery disease) ([place]) 09/09/2015  ? Coronary artery calcification seen on CT scan 07/20/2015  ? Personal history of tobacco use, presenting hazards to health 07/14/2015  ? Foraminal stenosis of lumbar region 01/28/2015  ? Facet arthropathy, lumbar 01/28/2015  ? Spinal stenosis of lumbar region 01/28/2015  ? Neuropathy of both feet 01/07/2015  ? Leg pain, bilateral 01/07/2015  ? Leg heaviness 01/07/2015  ? CKD (chronic kidney disease) stage 2, GFR 60-89 ml/min 10/17/2014  ? Allergic rhinitis 10/14/2014  ? Dyslipidemia 10/14/2014  ? Hypertension   ? Vitamin D deficiency disease   ? GERD (gastroesophageal reflux disease) 02/02/2014  ? ? ? ? ?Current Outpatient Medications:  ?  aspirin EC 81 MG tablet, Take 1 tablet (81 mg total) by mouth daily., Disp: 30 tablet, Rfl: 11 ?  Cholecalciferol (VITAMIN D) 2000 units CAPS, Take by mouth daily., Disp: , Rfl:  ?  DULoxetine (CYMBALTA) 30 MG capsule, Take 1 capsule (30 mg total) by mouth 2 (two) times daily., Disp: 90 capsule, Rfl: 3 ?  ezetimibe (ZETIA) 10 MG tablet, Take 1 tablet (10 mg total) by mouth daily., Disp: 90 tablet, Rfl: 3 ?  loratadine (CLARITIN) 10 MG tablet, Take 1 tablet (10  mg total) by mouth daily as needed., Disp: 90 tablet, Rfl: 3 ?  rosuvastatin (CRESTOR) 5 MG tablet, Take 5 mg po every other day at bedtime for atherosclerotic disease, Disp: 45 tablet, Rfl: 3 ? ? ?Allergies  ?Allergen Reactions  ? Atorvastatin Itching  ? Codeine Sulfate Itching  ? Pravastatin Nausea Only  ? Protonix [Pantoprazole Sodium] Other (See Comments)  ?  Legs felt heavy  ? Tylenol With Codeine #3  [Acetaminophen-Codeine] Itching  ? Penicillin G Rash   ? ? ? ?Social History  ? ?Tobacco Use  ? Smoking status: Former  ?  Packs/day: 1.00  ?  Years: 30.00  ?  Pack years: 30.00  ?  Types: Cigarettes  ?  Quit date: 10/13/2005  ?  Years since quitting: 15.7  ? Smokeless tobacco: Former  ?  Types: Snuff  ?  Quit date: 10/09/2011  ?Vaping Use  ? Vaping Use: Never used  ?Substance Use Topics  ? Alcohol use: No  ?  Alcohol/week: 0.0 standard drinks  ? Drug use: No  ?  ? ? ?Chart Review Today: ?I personally reviewed active problem list, medication list, allergies, family history, social history, health maintenance, notes from last encounter, lab results, imaging with the patient/caregiver today. ? ? ?Review of Systems  ?Constitutional: Negative.   ?HENT: Negative.    ?Eyes: Negative.   ?Respiratory: Negative.    ?Cardiovascular: Negative.   ?Gastrointestinal: Negative.  Negative for abdominal pain.  ?Endocrine: Negative.   ?Genitourinary: Negative.   ?Musculoskeletal:  Positive for back pain.  ?Skin: Negative.   ?Allergic/Immunologic: Negative.   ?Neurological: Negative.  Negative for tingling, weakness, numbness and paresthesias.  ?Hematological: Negative.   ?Psychiatric/Behavioral: Negative.    ?All other systems reviewed and are negative. ? ?   ?Objective:  ? ?Vitals:  ? 07/22/21 1419  ?BP: 140/76  ?Pulse: 85  ?Resp: 16  ?Temp: 98.1 ?F (36.7 ?C)  ?TempSrc: Oral  ?SpO2: 95%  ?Weight: 201 lb 6.4 oz (91.4 kg)  ?Height: _0  (1.727 m)  ?  ?Body mass index is 30.62 kg/m?. ? ?Physical Exam ?Vitals and nursing note reviewed.  ?Constitutional:   ?   Appearance: She is well-developed.  ?HENT:  ?   Head: Normocephalic and atraumatic.  ?   Nose: Nose normal.  ?Eyes:  ?   General:     ?   Right eye: No discharge.     ?   Left eye: No discharge.  ?   Conjunctiva/sclera: Conjunctivae normal.  ?Neck:  ?   Trachea: No tracheal deviation.  ?Cardiovascular:  ?   Rate and Rhythm: Normal rate and regular rhythm.  ?Pulmonary:  ?   Effort: Pulmonary effort is normal. No respiratory distress.  ?    Breath sounds: No stridor.  ?Musculoskeletal:  ?   Cervical back: Normal.  ?   Thoracic back: Normal.  ?   Lumbar back: No bony tenderness. Decreased range of motion. Negative right straight leg raise test and negative left straight leg raise test.  ?Skin: ?   General: Skin is warm and dry.  ?   Findings: No rash.  ?Neurological:  ?   Mental Status: She is alert.  ?   Sensory: Sensation is intact.  ?   Motor: No abnormal muscle tone.  ?   Coordination: Coordination normal.  ?   Gait: Gait abnormal (antalgic).  ?Psychiatric:     ?   Behavior: Behavior normal.  ?  ? ?Results for orders placed or performed  in visit on 05/20/21  ?CBC w/Diff/Platelet  ?Result Value Ref Range  ? WBC 5.2 3.8 - 10.8 Thousand/uL  ? RBC 4.11 3.80 - 5.10 Million/uL  ? Hemoglobin 13.3 11.7 - 15.5 g/dL  ? HCT 39.5 35.0 - 45.0 %  ? MCV 96.1 80.0 - 100.0 fL  ? MCH 32.4 27.0 - 33.0 pg  ? MCHC 33.7 32.0 - 36.0 g/dL  ? RDW 12.7 11.0 - 15.0 %  ? Platelets 170 140 - 400 Thousand/uL  ? MPV 10.8 7.5 - 12.5 fL  ? Neutro Abs 2,564 1,500 - 7,800 cells/uL  ? Lymphs Abs 1,924 850 - 3,900 cells/uL  ? Absolute Monocytes 390 200 - 950 cells/uL  ? Eosinophils Absolute 270 15 - 500 cells/uL  ? Basophils Absolute 52 0 - 200 cells/uL  ? Neutrophils Relative % 49.3 %  ? Total Lymphocyte 37.0 %  ? Monocytes Relative 7.5 %  ? Eosinophils Relative 5.2 %  ? Basophils Relative 1.0 %  ?COMPLETE METABOLIC PANEL WITH GFR  ?Result Value Ref Range  ? Glucose, Bld 89 65 - 99 mg/dL  ? BUN 15 7 - 25 mg/dL  ? Creat 0.94 0.60 - 1.00 mg/dL  ? eGFR 63 > OR = 60 mL/min/1.38m  ? BUN/Creatinine Ratio NOT APPLICABLE 6 - 22 (calc)  ? Sodium 141 135 - 146 mmol/L  ? Potassium 4.1 3.5 - 5.3 mmol/L  ? Chloride 105 98 - 110 mmol/L  ? CO2 30 20 - 32 mmol/L  ? Calcium 9.5 8.6 - 10.4 mg/dL  ? Total Protein 6.6 6.1 - 8.1 g/dL  ? Albumin 4.2 3.6 - 5.1 g/dL  ? Globulin 2.4 1.9 - 3.7 g/dL (calc)  ? AG Ratio 1.8 1.0 - 2.5 (calc)  ? Total Bilirubin 0.8 0.2 - 1.2 mg/dL  ? Alkaline phosphatase (APISO)  63 37 - 153 U/L  ? AST 17 10 - 35 U/L  ? ALT 14 6 - 29 U/L  ?Lipid Profile  ?Result Value Ref Range  ? Cholesterol 108 <200 mg/dL  ? HDL 61 > OR = 50 mg/dL  ? Triglycerides 72 <150 mg/dL  ? LDL Cholesterol (C

## 2021-07-22 NOTE — Telephone Encounter (Signed)
?  Chief Complaint: lower L back/hip pain ?Symptoms: pain with walking- rated severe ?Frequency: started 2 days ?Pertinent Negatives: Patient denies fever, abdominal pain, burning with urination, blood in urine ?Disposition: [] ED /[] Urgent Care (no appt availability in office) / [x] Appointment(In office/virtual)/ []  [facility]/ [] Home Care/ [] Refused Recommended Disposition /[] [facility]/ []  Follow-up with PCP ?Additional Notes:    ?

## 2021-07-22 NOTE — Telephone Encounter (Signed)
Summary: back pain  ? Pt has been referred to neuro for back pain.  Pt states she is taking tylenol, but that is not strong enough.  Would like something to help w/ the pain.  She says it goes to her hip.   ?  ? ?Reason for Disposition ? [1] SEVERE back pain (e.g., excruciating, unable to do any normal activities) AND [2] not improved 2 hours after pain medicine ? ?Answer Assessment - Initial Assessment Questions ?1. ONSET: "When did the pain begin?"  ?    2 days ?2. LOCATION: "Where does it hurt?" (upper, mid or lower back) ?    Lower back- L side at hip ?3. SEVERITY: "How bad is the pain?"  (e.g., Scale 1-10; mild, moderate, or severe) ?  - MILD (1-3): doesn't interfere with normal activities  ?  - MODERATE (4-7): interferes with normal activities or awakens from sleep  ?  - SEVERE (8-10): excruciating pain, unable to do any normal activities  ?    Hurts to walk ?4. PATTERN: "Is the pain constant?" (e.g., yes, no; constant, intermittent)  ?    Constant- worse when moving ?5. RADIATION: "Does the pain shoot into your legs or elsewhere?" ?    No radiation ?6. CAUSE:  "What do you think is causing the back pain?"  ?    Physical job ?7. BACK OVERUSE:  "Any recent lifting of heavy objects, strenuous work or exercise?" ?    Work related ?8. MEDICATIONS: "What have you taken so far for the pain?" (e.g., nothing, acetaminophen, NSAIDS) ?    tylenol ?9. NEUROLOGIC SYMPTOMS: "Do you have any weakness, numbness, or problems with bowel/bladder control?" ?    no ?10. OTHER SYMPTOMS: "Do you have any other symptoms?" (e.g., fever, abdominal pain, burning with urination, blood in urine) ?      no ?11. PREGNANCY: "Is there any chance you are pregnant?" (e.g., yes, no; LMP) ? ?Protocols used: Back Pain-A-AH ? ?

## 2021-07-22 NOTE — Patient Instructions (Signed)
Follow up in 2 weeks or sooner if symptoms are getting worse ? ?If they radiate down leg like your sciatica in the past we will send in a steroid. ?If pain last longer then a month we will need to get an xray and send you to physical therapy ? ? ? ?Lumbosacral Radiculopathy ?Lumbosacral radiculopathy is a condition that involves the spinal nerves and nerve roots in the low back and bottom of the spine. The condition develops when these nerves and nerve roots move out of place or become inflamed and cause symptoms. ?What are the causes? ?This condition may be caused by: ?Pressure from a disk that bulges out of place (herniated disk). A disk is a plate of soft cartilage that separates bones in the spine. ?Disk changes that occur with age (disk degeneration). ?A narrowing of the bones of the lower back (spinal stenosis). ?A tumor. ?An infection. ?An injury that places sudden pressure on the disks that cushion the bones of your lower spine. ?What increases the risk? ?You are more likely to develop this condition if: ?You are a female who is 7-49 years old. ?You are a female who is 31-75 years old. ?You use improper technique when lifting things. ?You are overweight or live a sedentary lifestyle. ?You smoke. ?Your work requires frequent lifting. ?You do repetitive activities that strain the spine. ?What are the signs or symptoms? ?Symptoms of this condition include: ?Pain that goes down from your back into your legs (sciatica), usually on one side of the body. This is the most common symptom. The pain may be worse when you sit, cough, or sneeze. ?Tingling and numbness in your legs. ?Muscle weakness in your legs. ?Loss of bladder control or bowel control. ?How is this diagnosed? ?This condition may be diagnosed based on: ?Your symptoms and medical history. ?A physical exam. ?If the pain lasts, you may have tests, such as: ?MRI. ?X-ray. ?CT scan. ?A type of CT scan used to examine the spinal canal after injecting a dye into  your spine (myelogram). ?A test to measure how electrical impulses move through a nerve (nerve conduction study). ?A test to measure the electrical activity in muscles (electromyogram). ?How is this treated? ?In many cases, treatment is not needed for this condition. With rest, the condition usually gets better over time. If treatment is needed, it may include: ?Working with a physical therapist to improve strength and flexibility. ?Taking pain medicine. ?Applying heat or ice or both to the affected areas. ?Having chiropractic spinal manipulation. ?Using transcutaneous electrical nerve stimulation (TENS) therapy. ?Getting a steroid injection in the spine. ?Having surgery. This may be needed if other treatments do not help. Different types of surgery may be done depending on the cause of this condition. ?Follow these instructions at home: ?Activity ?Avoid bending and any other activities that make the problem worse. ?Maintain a proper position when standing or sitting. ?When standing, keep your upper back and neck straight with your shoulders pulled back. Avoid slouching. ?When sitting, keep your back straight and relax your shoulders. Do not round your shoulders or pull them backward. ?Do not sit or stand in one place for long periods of time. ?Take brief periods of rest throughout the day. This will reduce your pain. It is usually better to rest by lying down or standing, not sitting. ?Mix in mild activity or stretching between long periods of rest. This will help to prevent stiffness and pain. ?Get regular exercise. Ask your health care provider what activities are safe  for you. If you were shown how to do any exercises or stretches, do them as told by your health care provider. ?You may have to avoid lifting. Ask your health care provider how much you can safely lift. ?Always use proper lifting technique, which includes: ?Bending your knees. ?Keeping the load close to your body. ?Avoiding twisting. ?Managing  pain ?If directed, put ice on the affected area. To do this: ?Put ice in a plastic bag. ?Place a towel between your skin and the bag. ?Leave the ice on for 20 minutes, 2-3 times a day. ?Remove the ice if your skin turns bright red. This is very important. If you cannot feel pain, heat, or cold, you have a greater risk of damage to the area. ?If directed, apply heat to the affected area as often as told by your health care provider. Use the heat source that your health care provider recommends, such as a moist heat pack or a heating pad. ?Place a towel between your skin and the heat source. ?Leave the heat on for 20-30 minutes. ?Remove the heat if your skin turns bright red. This is especially important if you are unable to feel pain, heat, or cold. You have a greater risk of getting burned. ?Take over-the-counter and prescription medicines only as told by your health care provider. ?General instructions ?Sleep on a firm mattress in a comfortable position. Try lying on your side with your knees slightly bent. If you lie on your back, put a pillow under your knees. ?Ask your health care provider if the medicine prescribed to you requires you to avoid driving or using machinery. ?If your health care provider prescribed a diet or exercise program, follow it as told. ?Keep all follow-up visits. This is important. ?Contact a health care provider if: ?Your pain does not get better over time, even when taking pain medicines. ?Get help right away if: ?You develop severe pain. ?Your pain suddenly gets worse. ?You develop increasing weakness in your legs. ?You lose the ability to control your bladder or bowel. ?You have difficulty walking or balancing. ?You have a fever. ?Summary ?Lumbosacral radiculopathy is a condition that occurs when the spinal nerves and nerve roots in the lower part of the spine move out of place or become inflamed and cause symptoms. ?Symptoms include pain, numbness, and tingling that go down from your  back into your legs (sciatica), muscle weakness, and loss of bladder control or bowel control. ?If directed, apply ice or heat or both to the affected area as told by your health care provider. ?Follow instructions about activity, rest, and proper lifting technique. ?This information is not intended to replace advice given to you by your health care provider. Make sure you discuss any questions you have with your health care provider. ?Document Revised: 09/30/2020 Document Reviewed: 09/30/2020 ?Elsevier Patient Education ? 2023 [employer] ? ?

## 2021-08-02 DIAGNOSIS — M5136 Other intervertebral disc degeneration, lumbar region: Secondary | ICD-10-CM | POA: Diagnosis not present

## 2021-08-02 DIAGNOSIS — R200 Anesthesia of skin: Secondary | ICD-10-CM | POA: Diagnosis not present

## 2021-08-03 ENCOUNTER — Other Ambulatory Visit: Payer: Self-pay | Admitting: Neurosurgery

## 2021-08-03 DIAGNOSIS — M5136 Other intervertebral disc degeneration, lumbar region: Secondary | ICD-10-CM

## 2021-08-03 DIAGNOSIS — M51369 Other intervertebral disc degeneration, lumbar region without mention of lumbar back pain or lower extremity pain: Secondary | ICD-10-CM

## 2021-08-03 DIAGNOSIS — R200 Anesthesia of skin: Secondary | ICD-10-CM

## 2021-08-12 ENCOUNTER — Ambulatory Visit
Admission: RE | Admit: 2021-08-12 | Discharge: 2021-08-12 | Disposition: A | Discharge status: Home / Self Care | Payer: Medicare HMO | Source: Ambulatory Visit | Attending: Neurosurgery | Admitting: Neurosurgery

## 2021-08-12 DIAGNOSIS — R200 Anesthesia of skin: Secondary | ICD-10-CM | POA: Diagnosis not present

## 2021-08-12 DIAGNOSIS — R202 Paresthesia of skin: Secondary | ICD-10-CM | POA: Diagnosis not present

## 2021-08-12 DIAGNOSIS — M51369 Other intervertebral disc degeneration, lumbar region without mention of lumbar back pain or lower extremity pain: Secondary | ICD-10-CM

## 2021-08-12 DIAGNOSIS — M4316 Spondylolisthesis, lumbar region: Secondary | ICD-10-CM | POA: Diagnosis not present

## 2021-08-12 DIAGNOSIS — M5136 Other intervertebral disc degeneration, lumbar region: Secondary | ICD-10-CM | POA: Diagnosis present

## 2021-08-16 ENCOUNTER — Ambulatory Visit: Payer: Medicare HMO

## 2021-08-19 ENCOUNTER — Telehealth: Payer: Self-pay

## 2021-08-19 NOTE — Telephone Encounter (Signed)
Called pt told her specialist who ordered would need to review ?

## 2021-08-19 NOTE — Telephone Encounter (Signed)
Pt is calling to receive her imaging results. Pt reports that she soon will be going to work. Please advise CB- [phone] ?

## 2021-08-19 NOTE — Telephone Encounter (Signed)
Copied from [place] [id number]. Topic: General - Call Back - No Documentation >> Aug 19, 2021 12:42 PM Erick Blinks wrote: Reason for CRM: Pt called and wants to discuss her imaging results   Best contact: [phone]

## 2021-09-01 ENCOUNTER — Ambulatory Visit: Payer: Medicare HMO

## 2021-09-06 ENCOUNTER — Ambulatory Visit (INDEPENDENT_AMBULATORY_CARE_PROVIDER_SITE_OTHER): Payer: Medicare HMO | Admitting: Family Medicine

## 2021-09-06 ENCOUNTER — Encounter: Payer: Self-pay | Admitting: Family Medicine

## 2021-09-06 VITALS — BP 132/80 | HR 79 | Resp 16 | Ht 68.0 in | Wt 203.0 lb

## 2021-09-06 DIAGNOSIS — G5793 Unspecified mononeuropathy of bilateral lower limbs: Secondary | ICD-10-CM | POA: Diagnosis not present

## 2021-09-06 DIAGNOSIS — R197 Diarrhea, unspecified: Secondary | ICD-10-CM | POA: Diagnosis not present

## 2021-09-06 MED ORDER — DULOXETINE HCL 30 MG PO CPEP: 30.0000 mg | ORAL_CAPSULE | Freq: Two times a day (BID) | ORAL | 1 refills | Status: DC

## 2021-09-06 NOTE — Patient Instructions (Signed)
  Ricard Dillon, [address], [place] [address]   [phone] (Work)   [phone] (Fax)     Loleta Dicker, [address], [place] [address]   [phone] (Work)   [phone] (Fax)    Food Choices to Help Relieve Diarrhea, Adult Diarrhea can make you feel weak and cause you to become dehydrated. It is important to choose the right foods and drinks to: Relieve diarrhea. Replace lost fluids and nutrients. Prevent dehydration. What are tips for following this plan? Relieving diarrhea Avoid foods that make your diarrhea worse. These may include: Foods and beverages sweetened with high-fructose corn syrup, honey, or sweeteners such as xylitol, sorbitol, and mannitol. Fried, greasy, or spicy foods. Raw fruits and vegetables. Eat foods that are rich in probiotics. These include foods such as yogurt and fermented milk products. Probiotics can help increase healthy bacteria in your stomach and intestines (gastrointestinal tract or GI tract). This may help digestion and stop diarrhea. If you have lactose intolerance, avoid dairy products. These may make your diarrhea worse. Take medicine to help stop diarrhea only as told by your health care provider. Replacing nutrients  Eat bland, easy-to-digest foods in small amounts as you are able, until your diarrhea starts to get better. These foods include bananas, applesauce, rice, toast, and crackers. Gradually reintroduce nutrient-rich foods as tolerated or as told by your health care provider. This includes: Well-cooked protein foods, such as eggs, lean meats like fish or chicken without skin, and tofu. Peeled, seeded, and soft-cooked fruits and vegetables. Low-fat dairy products. Whole grains. Take vitamin and mineral supplements as told by your health care provider. Preventing dehydration  Start by sipping water or a solution to prevent dehydration (oral rehydration solution, ORS). This is  a drink that helps replace fluids and minerals your body has lost. You can buy an ORS at pharmacies and retail stores. Try to drink at least 8-10 cups (2,000-2,500 mL) of fluid each day to help replace lost fluids. If you have urine that is pale yellow, you are getting enough fluids. You may drink other liquids in addition to water, such as fruit juice that you have added water to (diluted fruit juice) or low-calorie sports drinks, as tolerated or as told by your health care provider. Avoid drinks with caffeine, such as coffee, tea, or soft drinks. Avoid alcohol. Summary When you have diarrhea, it is important to choose the right foods and drinks to relieve diarrhea, to replace lost fluids and nutrients, and to prevent dehydration. Make sure you drink enough fluid to keep your urine pale yellow. You may benefit from eating bland foods at first. Gradually reintroduce healthy, nutrient-rich foods as tolerated or as told by your health care provider. Avoid foods that make your diarrhea worse, such as fried, greasy, or spicy foods. This information is not intended to replace advice given to you by your health care provider. Make sure you discuss any questions you have with your health care provider. Document Revised: 05/13/2019 Document Reviewed: 05/13/2019 Elsevier Patient Education  [employer].

## 2021-09-06 NOTE — Progress Notes (Signed)
Patient ID: Dana Peters, female    DOB: October 14, 1945, 76 y.o.   MRN: [id number]  PCP: Delsa Grana, PA-C  Chief Complaint  Patient presents with   Follow-up    Subjective:   Dana Peters is a 76 y.o. female, presents to clinic with CC of the following:  Diarrhea  This is a new problem. The current episode started more than 1 month ago (1-2). Episode frequency: 1 time per day. The problem has been unchanged. Diarrhea characteristics: looser and watery, used to be normal/formed. Associated symptoms include increased flatus. Pertinent negatives include no abdominal pain, arthralgias, bloating, chills, coughing, fever, headaches, myalgias, sweats, URI, vomiting or weight loss. Associated symptoms comments: No change in appetite, no n/v. Nothing aggravates the symptoms. There are no known risk factors (no change in diet, no abx, no sick contacts or travel). She has tried bismuth subsalicylate for the symptoms. The treatment provided mild (temporarily improvement in bowels/upset stomach once day) relief. There is no history of bowel resection, inflammatory bowel disease, irritable bowel syndrome, malabsorption, a recent abdominal surgery or short gut syndrome.   She denies any indigestion, bloating/fullness, regurg No known or recent diet changes prior to looser stools no blood, melena Did colonoscopy 1-2 years ago  Concern about CKD, last labs GFR >60  Reviewed labs  Needs letter to excuse her from [place] duty as well   Patient Active Problem List   Diagnosis Date Noted   Class 2 severe obesity with serious comorbidity and body mass index (BMI) of 35.0 to 35.9 in adult, unspecified obesity type ([place]) 05/20/2021   Lymphedema 02/26/2020   Personal history of colonic polyps    Bilateral lower extremity edema 09/27/2017   Emphysema of lung ([place]) 08/14/2017   Ganglion cyst of finger 06/25/2017   Full code status 06/25/2017   Osteoarthritis of knee 06/12/2017   Baker's cyst of  knee, right 06/04/2017   Thoracic aortic atherosclerosis ([place]) 08/01/2016   Osteopenia 06/15/2016   Obesity (BMI 30.0-34.9) 05/29/2016   Urinary hesitancy 02/14/2016   PAD (peripheral artery disease) ([place]) 09/09/2015   Coronary artery calcification seen on CT scan 07/20/2015   Personal history of tobacco use, presenting hazards to health 07/14/2015   Foraminal stenosis of lumbar region 01/28/2015   Facet arthropathy, lumbar 01/28/2015   Spinal stenosis of lumbar region 01/28/2015   Neuropathy of both feet 01/07/2015   Leg pain, bilateral 01/07/2015   Leg heaviness 01/07/2015   CKD (chronic kidney disease) stage 2, GFR 60-89 ml/min 10/17/2014   Allergic rhinitis 10/14/2014   Dyslipidemia 10/14/2014   Hypertension    Vitamin D deficiency disease    GERD (gastroesophageal reflux disease) 02/02/2014      Current Outpatient Medications:    aspirin EC 81 MG tablet, Take 1 tablet (81 mg total) by mouth daily., Disp: 30 tablet, Rfl: 11   Cholecalciferol (VITAMIN D) 2000 units CAPS, Take by mouth daily., Disp: , Rfl:    DULoxetine (CYMBALTA) 30 MG capsule, Take 1 capsule (30 mg total) by mouth 2 (two) times daily., Disp: 90 capsule, Rfl: 3   ezetimibe (ZETIA) 10 MG tablet, Take 1 tablet (10 mg total) by mouth daily., Disp: 90 tablet, Rfl: 3   loratadine (CLARITIN) 10 MG tablet, Take 1 tablet (10 mg total) by mouth daily as needed., Disp: 90 tablet, Rfl: 3   meloxicam (MOBIC) 15 MG tablet, Take 1 tablet (15 mg total) by mouth daily., Disp: 30 tablet, Rfl: 1   rosuvastatin (CRESTOR) 5 MG  tablet, Take 5 mg po every other day at bedtime for atherosclerotic disease, Disp: 45 tablet, Rfl: 3   Allergies  Allergen Reactions   Atorvastatin Itching   Codeine Sulfate Itching   Pravastatin Nausea Only   Protonix [Pantoprazole Sodium] Other (See Comments)    Legs felt heavy   Tylenol With Codeine #3  [Acetaminophen-Codeine] Itching   Penicillin G Rash     Social History   Tobacco Use    Smoking status: Former    Packs/day: 1.00    Years: 30.00    Pack years: 30.00    Types: Cigarettes    Quit date: 10/13/2005    Years since quitting: 15.9   Smokeless tobacco: Former    Types: Snuff    Quit date: 10/09/2011  Vaping Use   Vaping Use: Never used  Substance Use Topics   Alcohol use: No    Alcohol/week: 0.0 standard drinks   Drug use: No      Chart Review Today: I personally reviewed active problem list, medication list, allergies, family history, social history, health maintenance, notes from last encounter, lab results, imaging with the patient/caregiver today.   Review of Systems  Constitutional: Negative.  Negative for activity change, appetite change, chills, diaphoresis, fatigue, fever, unexpected weight change and weight loss.  HENT: Negative.    Eyes: Negative.   Respiratory: Negative.  Negative for cough, chest tightness and shortness of breath.   Cardiovascular: Negative.  Negative for chest pain.  Gastrointestinal:  Positive for diarrhea and flatus. Negative for abdominal distention, abdominal pain, anal bleeding, bloating, blood in stool, constipation, nausea and vomiting.  Endocrine: Negative.   Genitourinary: Negative.  Negative for decreased urine volume, difficulty urinating, dyspareunia, dysuria, flank pain, hematuria and urgency.  Musculoskeletal: Negative.  Negative for arthralgias and myalgias.  Skin: Negative.   Allergic/Immunologic: Negative.   Neurological: Negative.  Negative for headaches.  Hematological: Negative.   Psychiatric/Behavioral: Negative.    All other systems reviewed and are negative.     Objective:   Vitals:   09/06/21 1051  BP: 132/80  Pulse: 79  Resp: 16  SpO2: 96%  Weight: 203 lb (92.1 kg)  Height: 5' 8"  (1.727 m)    Body mass index is 30.87 kg/m.  Physical Exam Vitals and nursing note reviewed.  Constitutional:      General: She is not in acute distress.    Appearance: Normal appearance. She is obese. She  is not ill-appearing, toxic-appearing or diaphoretic.  HENT:     Head: Normocephalic and atraumatic.     Right Ear: External ear normal.     Left Ear: External ear normal.  Eyes:     General: No scleral icterus.       Right eye: No discharge.        Left eye: No discharge.     Conjunctiva/sclera: Conjunctivae normal.  Cardiovascular:     Rate and Rhythm: Normal rate and regular rhythm.     Pulses: Normal pulses.     Heart sounds: Normal heart sounds. No murmur heard.   No friction rub. No gallop.  Pulmonary:     Effort: Pulmonary effort is normal. No respiratory distress.     Breath sounds: Normal breath sounds. No stridor. No wheezing, rhonchi or rales.  Abdominal:     General: Bowel sounds are normal. There is no distension or abdominal bruit.     Palpations: Abdomen is soft. There is no fluid wave, hepatomegaly, splenomegaly, mass or pulsatile mass.  Tenderness: There is no abdominal tenderness. There is no right CVA tenderness, left CVA tenderness, guarding or rebound. Negative signs include Murphy's sign and McBurney's sign.     Hernia: No hernia is present.  Skin:    General: Skin is warm and dry.     Coloration: Skin is not jaundiced or pale.  Neurological:     Mental Status: She is alert. Mental status is at baseline.     Gait: Gait normal.  Psychiatric:        Mood and Affect: Mood normal.        Behavior: Behavior normal.     Results for orders placed or performed in visit on 05/20/21  CBC w/Diff/Platelet  Result Value Ref Range   WBC 5.2 3.8 - 10.8 Thousand/uL   RBC 4.11 3.80 - 5.10 Million/uL   Hemoglobin 13.3 11.7 - 15.5 g/dL   HCT 39.5 35.0 - 45.0 %   MCV 96.1 80.0 - 100.0 fL   MCH 32.4 27.0 - 33.0 pg   MCHC 33.7 32.0 - 36.0 g/dL   RDW 12.7 11.0 - 15.0 %   Platelets 170 140 - 400 Thousand/uL   MPV 10.8 7.5 - 12.5 fL   Neutro Abs 2,564 1,500 - 7,800 cells/uL   Lymphs Abs 1,924 850 - 3,900 cells/uL   Absolute Monocytes 390 200 - 950 cells/uL    Eosinophils Absolute 270 15 - 500 cells/uL   Basophils Absolute 52 0 - 200 cells/uL   Neutrophils Relative % 49.3 %   Total Lymphocyte 37.0 %   Monocytes Relative 7.5 %   Eosinophils Relative 5.2 %   Basophils Relative 1.0 %  COMPLETE METABOLIC PANEL WITH GFR  Result Value Ref Range   Glucose, Bld 89 65 - 99 mg/dL   BUN 15 7 - 25 mg/dL   Creat 0.94 0.60 - 1.00 mg/dL   eGFR 63 > OR = 60 mL/min/1.36m   BUN/Creatinine Ratio NOT APPLICABLE 6 - 22 (calc)   Sodium 141 135 - 146 mmol/L   Potassium 4.1 3.5 - 5.3 mmol/L   Chloride 105 98 - 110 mmol/L   CO2 30 20 - 32 mmol/L   Calcium 9.5 8.6 - 10.4 mg/dL   Total Protein 6.6 6.1 - 8.1 g/dL   Albumin 4.2 3.6 - 5.1 g/dL   Globulin 2.4 1.9 - 3.7 g/dL (calc)   AG Ratio 1.8 1.0 - 2.5 (calc)   Total Bilirubin 0.8 0.2 - 1.2 mg/dL   Alkaline phosphatase (APISO) 63 37 - 153 U/L   AST 17 10 - 35 U/L   ALT 14 6 - 29 U/L  Lipid Profile  Result Value Ref Range   Cholesterol 108 <200 mg/dL   HDL 61 > OR = 50 mg/dL   Triglycerides 72 <150 mg/dL   LDL Cholesterol (Calc) 32 mg/dL (calc)   Total CHOL/HDL Ratio 1.8 <5.0 (calc)   Non-HDL Cholesterol (Calc) 47 <130 mg/dL (calc)       Assessment & Plan:     ICD-10-CM   1. Diarrhea, unspecified type  R19.7 CBC with Differential/Platelet    COMPLETE METABOLIC PANEL WITH GFR    Lipase   once per day, loose, no blood, no abd pain, abd exam benign, no weight loss Encouraged pt to observe diet/other factors that may be related to bowel changes She appears well hydrated Pt given take home hemoccult x 3 to r/o GI blood loss No upper GI sx  If not improvement we can ref to GI Explained that  basic labs are usually low yield    2. Neuropathy of both feet  G57.93 DULoxetine (CYMBALTA) 30 MG capsule   Increased dose of Cymbalta to twice daily, follow-up in 2 weeks to see if symptoms are better managed        Delsa Grana, PA-C 09/06/21 11:04 AM

## 2021-09-07 ENCOUNTER — Encounter: Payer: Self-pay | Admitting: Family Medicine

## 2021-09-07 LAB — CBC WITH DIFFERENTIAL/PLATELET
Absolute Monocytes: 456 {cells}/uL (ref 200–950)
Basophils Absolute: 42 {cells}/uL (ref 0–200)
Basophils Relative: 0.7 %
Eosinophils Absolute: 150 {cells}/uL (ref 15–500)
Eosinophils Relative: 2.5 %
HCT: 38.6 % (ref 35.0–45.0)
Hemoglobin: 13.1 g/dL (ref 11.7–15.5)
Lymphs Abs: 1764 {cells}/uL (ref 850–3900)
MCH: 32.8 pg (ref 27.0–33.0)
MCHC: 33.9 g/dL (ref 32.0–36.0)
MCV: 96.5 fL (ref 80.0–100.0)
MPV: 10.8 fL (ref 7.5–12.5)
Monocytes Relative: 7.6 %
Neutro Abs: 3588 {cells}/uL (ref 1500–7800)
Neutrophils Relative %: 59.8 %
Platelets: 179 10*3/uL (ref 140–400)
RBC: 4 Million/uL (ref 3.80–5.10)
RDW: 13 % (ref 11.0–15.0)
Total Lymphocyte: 29.4 %
WBC: 6 10*3/uL (ref 3.8–10.8)

## 2021-09-07 LAB — COMPLETE METABOLIC PANEL WITHOUT GFR
AG Ratio: 1.5 (calc) (ref 1.0–2.5)
ALT: 17 U/L (ref 6–29)
AST: 18 U/L (ref 10–35)
Albumin: 4.1 g/dL (ref 3.6–5.1)
Alkaline phosphatase (APISO): 68 U/L (ref 37–153)
BUN: 13 mg/dL (ref 7–25)
CO2: 24 mmol/L (ref 20–32)
Calcium: 9.8 mg/dL (ref 8.6–10.4)
Chloride: 105 mmol/L (ref 98–110)
Creat: 0.85 mg/dL (ref 0.60–1.00)
Globulin: 2.7 g/dL (ref 1.9–3.7)
Glucose, Bld: 101 mg/dL — ABNORMAL HIGH (ref 65–99)
Potassium: 4.3 mmol/L (ref 3.5–5.3)
Sodium: 140 mmol/L (ref 135–146)
Total Bilirubin: 0.5 mg/dL (ref 0.2–1.2)
Total Protein: 6.8 g/dL (ref 6.1–8.1)
eGFR: 71 mL/min/{1.73_m2}

## 2021-09-07 LAB — LIPASE: Lipase: 42 U/L (ref 7–60)

## 2021-09-13 ENCOUNTER — Ambulatory Visit (INDEPENDENT_AMBULATORY_CARE_PROVIDER_SITE_OTHER): Payer: Medicare HMO

## 2021-09-13 DIAGNOSIS — Z0000 Encounter for general adult medical examination without abnormal findings: Secondary | ICD-10-CM

## 2021-09-13 NOTE — Progress Notes (Signed)
Subjective:   Dana Peters is a 76 y.o. female who presents for Medicare Annual (Subsequent) preventive examination.  Virtual Visit via Telephone Note  I connected with  Dana Peters on 09/13/21 at  9:45 AM EDT by telephone and verified that I am speaking with the correct person using two identifiers.  Location: Patient: home Provider: [place] Persons participating in the virtual visit: [place]   I discussed the limitations, risks, security and privacy concerns of performing an evaluation and management service by telephone and the availability of in person appointments. The patient expressed understanding and agreed to proceed.  Interactive audio and video telecommunications were attempted between this nurse and patient, however failed, due to patient having technical difficulties OR patient did not have access to video capability.  We continued and completed visit with audio only.  Some vital signs may be absent or patient reported.   Clemetine Marker, LPN   Review of Systems    Cardiac Risk Factors include: advanced age (>24men, >42 women);dyslipidemia;hypertension;obesity (BMI >30kg/m2)     Objective:    There were no vitals filed for this visit. There is no height or weight on file to calculate BMI.     09/13/2021    9:59 AM 08/31/2020    9:34 AM 08/29/2019    8:47 AM 07/10/2019    8:59 AM 06/27/2018   11:01 AM 04/28/2018    3:53 PM 05/28/2017   10:18 AM  Advanced Directives  Does Patient Have a Medical Advance Directive? Yes No No No Yes No Yes  Type of Paramedic of [place];Living will    Living will;Healthcare Power of Attorney  Living will;Healthcare Power of Attorney  Does patient want to make changes to medical advance directive?   No - Patient declined      Copy of [place] in Chart? No - copy requested    No - copy requested    Would patient like information on creating a medical advance  directive?  Yes (MAU/Ambulatory/Procedural Areas - Information given)  Yes (MAU/Ambulatory/Procedural Areas - Information given)       Current Medications (verified) Outpatient Encounter Medications as of 09/13/2021  Medication Sig   aspirin EC 81 MG tablet Take 1 tablet (81 mg total) by mouth daily.   Cholecalciferol (VITAMIN D) 2000 units CAPS Take by mouth daily.   DULoxetine (CYMBALTA) 30 MG capsule Take 1 capsule (30 mg total) by mouth 2 (two) times daily.   ezetimibe (ZETIA) 10 MG tablet Take 1 tablet (10 mg total) by mouth daily.   loratadine (CLARITIN) 10 MG tablet Take 1 tablet (10 mg total) by mouth daily as needed.   meloxicam (MOBIC) 15 MG tablet Take 1 tablet (15 mg total) by mouth daily.   rosuvastatin (CRESTOR) 5 MG tablet Take 5 mg po every other day at bedtime for atherosclerotic disease   No facility-administered encounter medications on file as of 09/13/2021.    Allergies (verified) Atorvastatin, Codeine sulfate, Pravastatin, Protonix [pantoprazole sodium], Tylenol with codeine #3  [acetaminophen-codeine], and Penicillin g   History: Past Medical History:  Diagnosis Date   Allergy    Baker's cyst of knee, right 06/04/2017   [name] Feb 2019   CKD (chronic kidney disease) stage 2, GFR 60-89 ml/min    Dyslipidemia    GERD (gastroesophageal reflux disease)    Hypertension    Lumbar spinal stenosis    Neuralgia neuritis, sciatic nerve 08/28/2012   Neuropathy    both  feet   Obesity (BMI 30.0-34.9) 05/29/2016   Osteopenia 06/15/2016   March 2018   Personal history of tobacco use, presenting hazards to health 07/14/2015   Reflux    Sciatica    Thoracic aortic atherosclerosis (HCC) 08/01/2016   Chest CT   Venous stasis    Vitamin D deficiency disease    Wears dentures    Full upper, partial lower   Past Surgical History:  Procedure Laterality Date   ABDOMINAL HYSTERECTOMY     complete, due to heavy   CATARACT EXTRACTION, BILATERAL     COLONOSCOPY WITH PROPOFOL N/A  08/29/2019   Procedure: COLONOSCOPY WITH PROPOFOL;  Surgeon: [name], MD;  Location: [facility];  Service: Endoscopy;  Laterality: N/A;  priority 4   ECTOPIC PREGNANCY SURGERY     Family History  Problem Relation Age of Onset   Cancer Mother        colon   Hypertension Mother    Alzheimer's disease Mother    Cancer Father        prostate   Cancer Brother        lung   Stroke Brother    Cancer Brother        prostate and brain   Cancer Brother        throat   Diabetes Paternal Aunt    Breast cancer Paternal Aunt    Healthy Daughter    Cirrhosis Brother    Social History   Socioeconomic History   Marital status: Married    Spouse name: Not on file   Number of children: 1   Years of education: Not on file   Highest education level: 12th grade  Occupational History   Occupation: retired  Tobacco Use   Smoking status: Former    Packs/day: 1.00    Years: 30.00    Pack years: 30.00    Types: Cigarettes    Quit date: 10/13/2005    Years since quitting: 15.9   Smokeless tobacco: Former    Types: Snuff    Quit date: 10/09/2011  Vaping Use   Vaping Use: Never used  Substance and Sexual Activity   Alcohol use: No    Alcohol/week: 0.0 standard drinks   Drug use: No   Sexual activity: Yes  Other Topics Concern   Not on file  Social History Narrative   Lives with husband in a one story home.  Has one daughter.     Retired from [employer] work.     Education: high school.   Social Determinants of Health   Financial Resource Strain: Low Risk    Difficulty of Paying Living Expenses: Not very hard  Food Insecurity: No Food Insecurity   Worried About Charity fundraiser in the Last Year: Never true   Ran Out of Food in the Last Year: Never true  Transportation Needs: No Transportation Needs   Lack of Transportation (Medical): No   Lack of Transportation (Non-Medical): No  Physical Activity: Inactive   Days of Exercise per Week: 0 days   Minutes of Exercise per  Session: 0 min  Stress: No Stress Concern Present   Feeling of Stress : Not at all  Social Connections: Moderately Integrated   Frequency of Communication with Friends and Family: More than three times a week   Frequency of Social Gatherings with Friends and Family: Three times a week   Attends Religious Services: More than 4 times per year   Active Member of Clubs or Organizations:  No   Attends Archivist Meetings: Never   Marital Status: Married    Tobacco Counseling Counseling given: Not Answered   Clinical Intake:  Pre-visit preparation completed: Yes  Pain : No/denies pain     Nutritional Risks: None Diabetes: No  How often do you need to have someone help you when you read instructions, pamphlets, or other written materials from your doctor or pharmacy?: 1 - Never    Interpreter Needed?: No  Information entered by :: Clemetine Marker LPN   Activities of Daily Living    09/13/2021    9:59 AM 09/06/2021   10:51 AM  In your present state of health, do you have any difficulty performing the following activities:  Hearing? 1 1  Vision? 0 0  Difficulty concentrating or making decisions? 0 0  Walking or climbing stairs? 0 0  Dressing or bathing? 0 0  Doing errands, shopping? 0 0  Preparing Food and eating ? N   Using the Toilet? N   In the past six months, have you accidently leaked urine? N   Do you have problems with loss of bowel control? N   Managing your Medications? N   Managing your Finances? N   Housekeeping or managing your Housekeeping? N     Patient Care Team: Delsa Grana, PA-C as PCP - General (Family Medicine)  Indicate any recent Medical Services you may have received from other than [facility] providers in the past year (date may be approximate).     Assessment:   This is a routine wellness examination for [place].  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; declines hearing  aids Vision Screening - Comments:: Annual vision screenings done at [facility] issues and exercise activities discussed: Current Exercise Habits: The patient has a physically strenuous job, but has no regular exercise apart from work., Exercise limited by: None identified   Goals Addressed   None    Depression Screen    09/13/2021    9:58 AM 09/06/2021   10:51 AM 07/22/2021    2:19 PM 07/15/2021    9:44 AM 06/28/2021    1:40 PM 05/20/2021   10:27 AM 03/30/2021   10:58 AM  PHQ 2/9 Scores  PHQ - 2 Score 0 0 0 0 0 0 0  PHQ- 9 Score  0 0 0 0 0     Fall Risk    09/13/2021    9:59 AM 09/06/2021   10:51 AM 07/22/2021    2:19 PM 07/15/2021    9:44 AM 06/28/2021    1:39 PM  [place] in the past year? 0 0 0 0 0  Number falls in past yr: 0 0 0 0 0  Injury with Fall? 0 0 0 0 0  Risk for fall due to : No Fall Risks No Fall Risks No Fall Risks No Fall Risks No Fall Risks  Follow up Falls prevention discussed Falls prevention discussed Falls prevention discussed;Education provided Education provided;Falls prevention discussed Falls prevention discussed    FALL RISK PREVENTION PERTAINING TO THE HOME:  Any stairs in or around the home? Yes  If so, are there any without handrails? Yes  - 2 steps outside Home free of loose throw rugs in walkways, pet beds, electrical cords, etc? Yes  Adequate lighting in your home to reduce risk of falls? Yes   ASSISTIVE DEVICES UTILIZED TO PREVENT FALLS:  Life alert? No  Use of a cane, walker or w/c? No  Grab bars in the bathroom? No  Shower chair or bench in shower? No  Elevated toilet seat or a handicapped toilet? Yes   TIMED UP AND GO:  Was the test performed? No . Telephonic visit.   Cognitive Function: Normal cognitive status assessed by direct observation by this Nurse Health Advisor. No abnormalities found.          08/31/2020    9:40 AM 07/10/2019    9:03 AM 06/27/2018   11:06 AM 06/25/2017   11:38 AM 06/15/2016   11:41 AM   6CIT Screen  What Year? 0 points 0 points 0 points 0 points 0 points  What month? 0 points 0 points 0 points 0 points 0 points  What time? 0 points 0 points 0 points 0 points 0 points  Count back from 20 0 points 0 points 0 points 0 points 0 points  Months in reverse 0 points 2 points 0 points 2 points 0 points  Repeat phrase 0 points 2 points 2 points 0 points 2 points  Total Score 0 points 4 points 2 points 2 points 2 points    Immunizations Immunization History  Administered Date(s) Administered   Fluad Quad(high Dose 65+) 12/03/2018, 01/12/2020, 02/09/2021   Influenza, High Dose Seasonal PF 01/19/2017   Influenza, Quadrivalent, Recombinant, Inj, Pf 01/24/2018   Influenza-Unspecified 03/18/2014, 01/05/2015, 01/03/2016   Moderna Sars-Covid-2 Vaccination 05/06/2019, 06/03/2019, 01/31/2020, 07/27/2020   PNEUMOCOCCAL CONJUGATE-20 12/31/2020, 05/20/2021   Pneumococcal Conjugate-13 11/18/2013   Pneumococcal Polysaccharide-23 05/15/2012, 01/10/2014   Td 01/10/2014   Tdap 11/22/2011   Zoster Recombinat (Shingrix) 10/15/2020, 12/31/2020   Zoster, Live 01/17/2012, 05/22/2014    TDAP status: Up to date  Flu Vaccine status: Up to date  Pneumococcal vaccine status: Up to date  Covid-19 vaccine status: Completed vaccines  Qualifies for Shingles Vaccine? Yes   Zostavax completed Yes   Shingrix Completed?: Yes  Screening Tests Health Maintenance  Topic Date Due   COVID-19 Vaccine (5 - Booster for Moderna series) 09/21/2020   INFLUENZA VACCINE  11/08/2021   DEXA SCAN  01/25/2022   MAMMOGRAM  01/26/2022   TETANUS/TDAP  01/11/2024   COLONOSCOPY (Pts 45-73yrs Insurance coverage will need to be confirmed)  08/28/2024   Pneumonia Vaccine 91+ Years old  Completed   Hepatitis C Screening  Completed   Zoster Vaccines- Shingrix  Completed   HPV VACCINES  Aged Out    Health Maintenance  Health Maintenance Due  Topic Date Due   COVID-19 Vaccine (5 - Booster for Moderna series)  09/21/2020    Colorectal cancer screening: Type of screening: Colonoscopy. Completed 08/29/19. Repeat every 5 years  Mammogram status: Completed 01/26/21. Repeat every year  Bone Density status: Completed 01/26/20. Results reflect: Bone density results: OSTEOPENIA. Repeat every 2 years.  Lung Cancer Screening: (Low Dose CT Chest recommended if Age 61-80 years, 30 pack-year currently smoking OR have quit w/in 15years.) does not qualify.   Additional Screening:  Hepatitis C Screening: does qualify; Completed 05/07/15  Vision Screening: Recommended annual ophthalmology exams for early detection of glaucoma and other disorders of the eye. Is the patient up to date with their annual eye exam?  Yes  Who is the provider or what is the name of the office in which the patient attends annual eye exams? Dr. Ellin Mayhew  Dental Screening: Recommended annual dental exams for proper oral hygiene  Community Resource Referral / Chronic Care Management: CRR required this visit?  No  CCM required this visit?  No      Plan:     I have personally reviewed and noted the following in the patient's chart:   Medical and social history Use of alcohol, tobacco or illicit drugs  Current medications and supplements including opioid prescriptions.  Functional ability and status Nutritional status Physical activity Advanced directives List of other physicians Hospitalizations, surgeries, and ER visits in previous 12 months Vitals Screenings to include cognitive, depression, and falls Referrals and appointments  In addition, I have reviewed and discussed with patient certain preventive protocols, quality metrics, and best practice recommendations. A written personalized care plan for preventive services as well as general preventive health recommendations were provided to patient.     Clemetine Marker, LPN   [id number]   Nurse Notes: none

## 2021-09-13 NOTE — Patient Instructions (Signed)
Dana Peters , Thank you for taking time to come for your Medicare Wellness Visit. I appreciate your ongoing commitment to your health goals. Please review the following plan we discussed and let me know if I can assist you in the future.   Screening recommendations/referrals: Colonoscopy: done 08/29/19. Repeat 08/2024 Mammogram: done 01/26/21 Bone Density: done 01/26/20 Recommended yearly ophthalmology/optometry visit for glaucoma screening and checkup Recommended yearly dental visit for hygiene and checkup  Vaccinations: Influenza vaccine: done 02/09/21 Pneumococcal vaccine: done 12/31/20 Tdap vaccine: done 01/10/14 Shingles vaccine: done 10/15/20 & 12/31/20 Covid-19:done 05/06/19, 06/03/19, 01/31/20 & 07/27/20   Advanced directives: Please bring a copy of your health care power of attorney and living will to the office at your convenience.   Conditions/risks identified: Keep up the great work!  Next appointment: Follow up in one year for your annual wellness visit    Preventive Care 65 Years and Older, Female Preventive care refers to lifestyle choices and visits with your health care provider that can promote health and wellness. What does preventive care include? A yearly physical exam. This is also called an annual well check. Dental exams once or twice a year. Routine eye exams. Ask your health care provider how often you should have your eyes checked. Personal lifestyle choices, including: Daily care of your teeth and gums. Regular physical activity. Eating a healthy diet. Avoiding tobacco and drug use. Limiting alcohol use. Practicing safe sex. Taking low-dose aspirin every day. Taking vitamin and mineral supplements as recommended by your health care provider. What happens during an annual well check? The services and screenings done by your health care provider during your annual well check will depend on your age, overall health, lifestyle risk factors, and family history of  disease. Counseling  Your health care provider may ask you questions about your: Alcohol use. Tobacco use. Drug use. Emotional well-being. Home and relationship well-being. Sexual activity. Eating habits. History of falls. Memory and ability to understand (cognition). Work and work Statistician. Reproductive health. Screening  You may have the following tests or measurements: Height, weight, and BMI. Blood pressure. Lipid and cholesterol levels. These may be checked every 5 years, or more frequently if you are over 56 years old. Skin check. Lung cancer screening. You may have this screening every year starting at age 27 if you have a 30-pack-year history of smoking and currently smoke or have quit within the past 15 years. Fecal occult blood test (FOBT) of the stool. You may have this test every year starting at age 25. Flexible sigmoidoscopy or colonoscopy. You may have a sigmoidoscopy every 5 years or a colonoscopy every 10 years starting at age 68. Hepatitis C blood test. Hepatitis B blood test. Sexually transmitted disease (STD) testing. Diabetes screening. This is done by checking your blood sugar (glucose) after you have not eaten for a while (fasting). You may have this done every 1-3 years. Bone density scan. This is done to screen for osteoporosis. You may have this done starting at age 66. Mammogram. This may be done every 1-2 years. Talk to your health care provider about how often you should have regular mammograms. Talk with your health care provider about your test results, treatment options, and if necessary, the need for more tests. Vaccines  Your health care provider may recommend certain vaccines, such as: Influenza vaccine. This is recommended every year. Tetanus, diphtheria, and acellular pertussis (Tdap, Td) vaccine. You may need a Td booster every 10 years. Zoster vaccine. You may need  this after age 25. Pneumococcal 13-valent conjugate (PCV13) vaccine. One  dose is recommended after age 92. Pneumococcal polysaccharide (PPSV23) vaccine. One dose is recommended after age 64. Talk to your health care provider about which screenings and vaccines you need and how often you need them. This information is not intended to replace advice given to you by your health care provider. Make sure you discuss any questions you have with your health care provider. Document Released: 04/23/2015 Document Revised: 12/15/2015 Document Reviewed: 01/26/2015 Elsevier Interactive Patient Education  2017 [place] Prevention in the Home Falls can cause injuries. They can happen to people of all ages. There are many things you can do to make your home safe and to help prevent falls. What can I do on the outside of my home? Regularly fix the edges of walkways and driveways and fix any cracks. Remove anything that might make you trip as you walk through a door, such as a raised step or threshold. Trim any bushes or trees on the path to your home. Use bright outdoor lighting. Clear any walking paths of anything that might make someone trip, such as rocks or tools. Regularly check to see if handrails are loose or broken. Make sure that both sides of any steps have handrails. Any raised decks and porches should have guardrails on the edges. Have any leaves, snow, or ice cleared regularly. Use sand or salt on walking paths during winter. Clean up any spills in your garage right away. This includes oil or grease spills. What can I do in the bathroom? Use night lights. Install grab bars by the toilet and in the tub and shower. Do not use towel bars as grab bars. Use non-skid mats or decals in the tub or shower. If you need to sit down in the shower, use a plastic, non-slip stool. Keep the floor dry. Clean up any water that spills on the floor as soon as it happens. Remove soap buildup in the tub or shower regularly. Attach bath mats securely with double-sided  non-slip rug tape. Do not have throw rugs and other things on the floor that can make you trip. What can I do in the bedroom? Use night lights. Make sure that you have a light by your bed that is easy to reach. Do not use any sheets or blankets that are too big for your bed. They should not hang down onto the floor. Have a firm chair that has side arms. You can use this for support while you get dressed. Do not have throw rugs and other things on the floor that can make you trip. What can I do in the kitchen? Clean up any spills right away. Avoid walking on wet floors. Keep items that you use a lot in easy-to-reach places. If you need to reach something above you, use a strong step stool that has a grab bar. Keep electrical cords out of the way. Do not use floor polish or wax that makes floors slippery. If you must use wax, use non-skid floor wax. Do not have throw rugs and other things on the floor that can make you trip. What can I do with my stairs? Do not leave any items on the stairs. Make sure that there are handrails on both sides of the stairs and use them. Fix handrails that are broken or loose. Make sure that handrails are as long as the stairways. Check any carpeting to make sure that it is firmly attached to  the stairs. Fix any carpet that is loose or worn. Avoid having throw rugs at the top or bottom of the stairs. If you do have throw rugs, attach them to the floor with carpet tape. Make sure that you have a light switch at the top of the stairs and the bottom of the stairs. If you do not have them, ask someone to add them for you. What else can I do to help prevent falls? Wear shoes that: Do not have high heels. Have rubber bottoms. Are comfortable and fit you well. Are closed at the toe. Do not wear sandals. If you use a stepladder: Make sure that it is fully opened. Do not climb a closed stepladder. Make sure that both sides of the stepladder are locked into place. Ask  someone to hold it for you, if possible. Clearly mark and make sure that you can see: Any grab bars or handrails. First and last steps. Where the edge of each step is. Use tools that help you move around (mobility aids) if they are needed. These include: Canes. Walkers. Scooters. Crutches. Turn on the lights when you go into a dark area. Replace any light bulbs as soon as they burn out. Set up your furniture so you have a clear path. Avoid moving your furniture around. If any of your floors are uneven, fix them. If there are any pets around you, be aware of where they are. Review your medicines with your doctor. Some medicines can make you feel dizzy. This can increase your chance of falling. Ask your doctor what other things that you can do to help prevent falls. This information is not intended to replace advice given to you by your health care provider. Make sure you discuss any questions you have with your health care provider. Document Released: 01/21/2009 Document Revised: 09/02/2015 Document Reviewed: 05/01/2014 Elsevier Interactive Patient Education  2017 [employer].

## 2021-10-13 ENCOUNTER — Other Ambulatory Visit: Payer: Self-pay | Admitting: Family Medicine

## 2021-10-13 DIAGNOSIS — I700 Atherosclerosis of aorta: Secondary | ICD-10-CM

## 2021-10-13 DIAGNOSIS — E785 Hyperlipidemia, unspecified: Secondary | ICD-10-CM

## 2021-10-13 DIAGNOSIS — I739 Peripheral vascular disease, unspecified: Secondary | ICD-10-CM

## 2021-10-13 DIAGNOSIS — I2510 Atherosclerotic heart disease of native coronary artery without angina pectoris: Secondary | ICD-10-CM

## 2021-10-25 DIAGNOSIS — H1045 Other chronic allergic conjunctivitis: Secondary | ICD-10-CM | POA: Diagnosis not present

## 2021-10-25 DIAGNOSIS — H5203 Hypermetropia, bilateral: Secondary | ICD-10-CM | POA: Diagnosis not present

## 2021-10-25 DIAGNOSIS — Z961 Presence of intraocular lens: Secondary | ICD-10-CM | POA: Diagnosis not present

## 2021-10-25 DIAGNOSIS — H26493 Other secondary cataract, bilateral: Secondary | ICD-10-CM | POA: Diagnosis not present

## 2021-11-14 ENCOUNTER — Other Ambulatory Visit: Payer: Self-pay | Admitting: Family Medicine

## 2021-11-14 DIAGNOSIS — I739 Peripheral vascular disease, unspecified: Secondary | ICD-10-CM

## 2021-11-14 DIAGNOSIS — E785 Hyperlipidemia, unspecified: Secondary | ICD-10-CM

## 2021-11-14 DIAGNOSIS — Z760 Encounter for issue of repeat prescription: Secondary | ICD-10-CM

## 2021-11-17 ENCOUNTER — Ambulatory Visit (INDEPENDENT_AMBULATORY_CARE_PROVIDER_SITE_OTHER): Payer: Medicare HMO | Admitting: Internal Medicine

## 2021-11-17 ENCOUNTER — Encounter: Payer: Self-pay | Admitting: Internal Medicine

## 2021-11-17 VITALS — BP 142/80 | HR 86 | Temp 98.4°F | Resp 16 | Ht 68.0 in | Wt 205.1 lb

## 2021-11-17 DIAGNOSIS — G5793 Unspecified mononeuropathy of bilateral lower limbs: Secondary | ICD-10-CM | POA: Diagnosis not present

## 2021-11-17 DIAGNOSIS — I739 Peripheral vascular disease, unspecified: Secondary | ICD-10-CM

## 2021-11-17 DIAGNOSIS — I2510 Atherosclerotic heart disease of native coronary artery without angina pectoris: Secondary | ICD-10-CM | POA: Diagnosis not present

## 2021-11-17 DIAGNOSIS — Z760 Encounter for issue of repeat prescription: Secondary | ICD-10-CM

## 2021-11-17 DIAGNOSIS — R030 Elevated blood-pressure reading, without diagnosis of hypertension: Secondary | ICD-10-CM | POA: Insufficient documentation

## 2021-11-17 DIAGNOSIS — E785 Hyperlipidemia, unspecified: Secondary | ICD-10-CM

## 2021-11-17 DIAGNOSIS — K219 Gastro-esophageal reflux disease without esophagitis: Secondary | ICD-10-CM | POA: Diagnosis not present

## 2021-11-17 DIAGNOSIS — Z1231 Encounter for screening mammogram for malignant neoplasm of breast: Secondary | ICD-10-CM | POA: Diagnosis not present

## 2021-11-17 MED ORDER — EZETIMIBE 10 MG PO TABS: 10.0000 mg | ORAL_TABLET | Freq: Every day | ORAL | 1 refills | Status: DC

## 2021-11-17 MED ORDER — ROSUVASTATIN CALCIUM 5 MG PO TABS: ORAL_TABLET | ORAL | 1 refills | Status: DC

## 2021-11-17 NOTE — Patient Instructions (Addendum)
It was great seeing you today!  Plan discussed at today's visit: -Refills today -Call to schedule mammogram   Follow up in: 6 months   Take care and let us know if you have any questions or concerns prior to your next visit.  Dr. Caralee Ates

## 2021-11-17 NOTE — Progress Notes (Signed)
Established Patient Office Visit  Subjective:  Patient ID: Dana Peters, female    DOB: [birth year]  Age: 76 y.o. MRN: [id number]  CC:  Chief Complaint  Patient presents with   Follow-up   Hyperlipidemia   Depression    HPI Dana Peters presents for follow up on chronic medical conditions.   Hypertension: -Medications: None, used to be on medication in the past but discontinued because well controlled  -Checking BP at home (average): not checking  -Denies any SOB, CP, vision changes, LE edema or symptoms of hypotension  HLD/CAD: -Medications: Crestor 5 mg every other day, Zetia 10 mg -Patient is compliant with above medications and reports no side effects.  -Last lipid panel: 2/22 Lipid Panel     Component Value Date/Time   CHOL 108 05/20/2021 1103   CHOL 177 05/07/2015 1131   TRIG 72 05/20/2021 1103   HDL 61 05/20/2021 1103   HDL 47 05/07/2015 1131   CHOLHDL 1.8 05/20/2021 1103   VLDL 39 (H) 10/26/2016 1123   LDLCALC 32 05/20/2021 1103   LABVLDL 40 05/07/2015 1131   Neuropathy: -Currently on Cymbalta 60 mg for bilateral neropathy in feet, symptoms well controlled. Increased at [place] and doing well.   GERD: -Not currently on medication -Takes Nexium as needed, only has symptoms with certain foods but now she eats slower which has helped avoid symptoms   CKD2: -Last BMP 5/23 with creatinine 0.85, GFR 71  Vitamin D Deficiency: -Currently on supplementation 2000 IU daily   Health Maintenance: -Blood work UTD -Mammogram 10/22 [place] 1 -DEXA 10/21: t score -1.4 in right femur, -0.6 in lumbar spine -Colonoscopy 5/21, negative -Low dose CT lung - 9/22 Lung Rads-2. Scheduled this year for 12/29/21.   Past Medical History:  Diagnosis Date   Allergy    Baker's cyst of knee, right 06/04/2017   [name] Feb 2019   CKD (chronic kidney disease) stage 2, GFR 60-89 ml/min    Dyslipidemia    GERD (gastroesophageal reflux disease)    Hypertension    Lumbar spinal  stenosis    Neuralgia neuritis, sciatic nerve 08/28/2012   Neuropathy    both feet   Obesity (BMI 30.0-34.9) 05/29/2016   Osteopenia 06/15/2016   March 2018   Personal history of tobacco use, presenting hazards to health 07/14/2015   Reflux    Sciatica    Thoracic aortic atherosclerosis ([place]) 08/01/2016   Chest CT   Venous stasis    Vitamin D deficiency disease    Wears dentures    Full upper, partial lower    Past Surgical History:  Procedure Laterality Date   ABDOMINAL HYSTERECTOMY     complete, due to heavy   CATARACT EXTRACTION, BILATERAL     COLONOSCOPY WITH PROPOFOL N/A 08/29/2019   Procedure: COLONOSCOPY WITH PROPOFOL;  Surgeon: [name], MD;  Location: [facility];  Service: Endoscopy;  Laterality: N/A;  priority 4   ECTOPIC PREGNANCY SURGERY      Family History  Problem Relation Age of Onset   Cancer Mother        colon   Hypertension Mother    Alzheimer's disease Mother    Cancer Father        prostate   Cancer Brother        lung   Stroke Brother    Cancer Brother        prostate and brain   Cancer Brother        throat   Diabetes  Paternal Aunt    Breast cancer Paternal Aunt    Healthy Daughter    Cirrhosis Brother     Social History   Socioeconomic History   Marital status: Married    Spouse name: Not on file   Number of children: 1   Years of education: Not on file   Highest education level: 12th grade  Occupational History   Occupation: retired  Tobacco Use   Smoking status: Former    Packs/day: 1.00    Years: 30.00    Total pack years: 30.00    Types: Cigarettes    Quit date: 10/13/2005    Years since quitting: 16.1   Smokeless tobacco: Former    Types: Snuff    Quit date: 10/09/2011  Vaping Use   Vaping Use: Never used  Substance and Sexual Activity   Alcohol use: No    Alcohol/week: 0.0 standard drinks of alcohol   Drug use: No   Sexual activity: Yes  Other Topics Concern   Not on file  Social History Narrative   Lives  with husband in a one story home.  Has one daughter.     Retired from [employer] work.     Education: high school.   Social Determinants of Health   Financial Resource Strain: Low Risk  (09/13/2021)   Overall Financial Resource Strain (CARDIA)    Difficulty of Paying Living Expenses: Not very hard  Food Insecurity: No Food Insecurity (09/13/2021)   Hunger Vital Sign    Worried About Running Out of Food in the Last Year: Never true    Ran Out of Food in the Last Year: Never true  Transportation Needs: No Transportation Needs (09/13/2021)   PRAPARE - Hydrologist (Medical): No    Lack of Transportation (Non-Medical): No  Physical Activity: Inactive (09/13/2021)   Exercise Vital Sign    Days of Exercise per Week: 0 days    Minutes of Exercise per Session: 0 min  Stress: No Stress Concern Present (09/13/2021)   [place]    Feeling of Stress : Not at all  Social Connections: Moderately Integrated (09/13/2021)   Social Connection and Isolation Panel [NHANES]    Frequency of Communication with Friends and Family: More than three times a week    Frequency of Social Gatherings with Friends and Family: Three times a week    Attends Religious Services: More than 4 times per year    Active Member of Clubs or Organizations: No    Attends Archivist Meetings: Never    Marital Status: Married  Human resources officer Violence: Not At Risk (09/13/2021)   Humiliation, Afraid, Rape, and Kick questionnaire    Fear of Current or Ex-Partner: No    Emotionally Abused: No    Physically Abused: No    Sexually Abused: No    Outpatient Medications Prior to Visit  Medication Sig Dispense Refill   aspirin EC 81 MG tablet Take 1 tablet (81 mg total) by mouth daily. 30 tablet 11   Cholecalciferol (VITAMIN D) 2000 units CAPS Take by mouth daily.     DULoxetine (CYMBALTA) 30 MG capsule Take 1 capsule (30 mg total) by mouth  2 (two) times daily. 180 capsule 1   ezetimibe (ZETIA) 10 MG tablet Take 1 tablet by mouth once daily 30 tablet 0   loratadine (CLARITIN) 10 MG tablet Take 1 tablet (10 mg total) by mouth daily as needed. 90 tablet  3   rosuvastatin (CRESTOR) 5 MG tablet TAKE 1 TABLET BY MOUTH EVERY OTHER DAY AT BEDTIME FOR  ATHEROSCLEROTIC  DISEASE 45 tablet 0   meloxicam (MOBIC) 15 MG tablet Take 1 tablet (15 mg total) by mouth daily. 30 tablet 1   No facility-administered medications prior to visit.    Allergies  Allergen Reactions   Atorvastatin Itching   Codeine Sulfate Itching   Pravastatin Nausea Only   Protonix [Pantoprazole Sodium] Other (See Comments)    Legs felt heavy   Tylenol With Codeine #3  [Acetaminophen-Codeine] Itching   Penicillin G Rash    ROS Review of Systems  Constitutional:  Negative for chills and fever.  Eyes:  Negative for visual disturbance.  Respiratory:  Negative for cough and shortness of breath.   Cardiovascular:  Negative for chest pain.  Gastrointestinal:  Negative for abdominal pain.  Neurological:  Negative for dizziness and headaches.      Objective:    Physical Exam Constitutional:      Appearance: Normal appearance.  HENT:     Head: Normocephalic and atraumatic.  Eyes:     Conjunctiva/sclera: Conjunctivae normal.  Cardiovascular:     Rate and Rhythm: Normal rate and regular rhythm.  Pulmonary:     Effort: Pulmonary effort is normal.     Breath sounds: Normal breath sounds.  Musculoskeletal:     Right lower leg: No edema.     Left lower leg: No edema.  Skin:    General: Skin is warm and dry.  Neurological:     General: No focal deficit present.     Mental Status: She is alert. Mental status is at baseline.  Psychiatric:        Mood and Affect: Mood normal.        Behavior: Behavior normal.     BP (!) 142/80   Pulse 86   Temp 98.4 F (36.9 C)   Resp 16   Ht _0  (1.727 m)   Wt 205 lb 1.6 oz (93 kg)   LMP  (LMP Unknown)   BMI  31.19 kg/m  Wt Readings from Last 3 Encounters:  11/17/21 205 lb 1.6 oz (93 kg)  09/06/21 203 lb (92.1 kg)  07/22/21 201 lb 6.4 oz (91.4 kg)     Health Maintenance Due  Topic Date Due   COVID-19 Vaccine (5 - Moderna series) 09/21/2020   INFLUENZA VACCINE  11/08/2021    There are no preventive care reminders to display for this patient.  Lab Results  Component Value Date   TSH 1.44 10/05/2016   Lab Results  Component Value Date   WBC 6.0 09/06/2021   HGB 13.1 09/06/2021   HCT 38.6 09/06/2021   MCV 96.5 09/06/2021   PLT 179 09/06/2021   Lab Results  Component Value Date   NA 140 09/06/2021   K 4.3 09/06/2021   CO2 24 09/06/2021   GLUCOSE 101 (H) 09/06/2021   BUN 13 09/06/2021   CREATININE 0.85 09/06/2021   BILITOT 0.5 09/06/2021   ALKPHOS 80 05/28/2017   AST 18 09/06/2021   ALT 17 09/06/2021   PROT 6.8 09/06/2021   ALBUMIN 4.1 05/28/2017   CALCIUM 9.8 09/06/2021   ANIONGAP 6 05/28/2017   EGFR 71 09/06/2021   Lab Results  Component Value Date   CHOL 108 05/20/2021   Lab Results  Component Value Date   HDL 61 05/20/2021   Lab Results  Component Value Date   LDLCALC 32 05/20/2021   Lab  Results  Component Value Date   TRIG 72 05/20/2021   Lab Results  Component Value Date   CHOLHDL 1.8 05/20/2021   Lab Results  Component Value Date   HGBA1C 5.6 09/10/2019      Assessment & Plan:   1. Dyslipidemia/PAD (peripheral artery disease) (HCC)/Medication refill/Coronary artery calcification seen on CT scan: Reviewed cholesterol panel with the patient from February.  Cholesterol well-controlled.  Continue Zetia 10 mg and Crestor 5 mg every other day, refilled today.  Follow-up in 6 months or sooner as needed.  - ezetimibe (ZETIA) 10 MG tablet; Take 1 tablet (10 mg total) by mouth daily.  Dispense: 90 tablet; Refill: 1 - rosuvastatin (CRESTOR) 5 MG tablet; TAKE 1 TABLET BY MOUTH EVERY OTHER DAY AT BEDTIME FOR  ATHEROSCLEROTIC  DISEASE  Dispense: 90 tablet;  Refill: 1  2. Elevated blood pressure reading: Slightly elevated today.  She had been diagnosed with hypertension in the past and had been on medications, however this was discontinued as her blood pressure was better controlled.  Continue to monitor.  3. Neuropathy of both feet: Stable.  Continue Cymbalta 60 mg daily.  4. Gastroesophageal reflux disease without esophagitis: Stable.  Does not have to take medications to control symptoms, is working on lifestyle management.  5. Breast cancer screening by mammogram: Mammogram will be due in the fall.  - MM Digital Screening; Future   Follow-up: Return in about 6 months (around 05/20/2022).    Teodora Medici, DO

## 2021-12-29 ENCOUNTER — Ambulatory Visit
Admission: RE | Admit: 2021-12-29 | Discharge: 2021-12-29 | Disposition: A | Discharge status: Home / Self Care | Payer: Medicare HMO | Source: Ambulatory Visit | Attending: Acute Care | Admitting: Acute Care

## 2021-12-29 DIAGNOSIS — Z122 Encounter for screening for malignant neoplasm of respiratory organs: Secondary | ICD-10-CM | POA: Insufficient documentation

## 2021-12-29 DIAGNOSIS — I2510 Atherosclerotic heart disease of native coronary artery without angina pectoris: Secondary | ICD-10-CM | POA: Diagnosis not present

## 2021-12-29 DIAGNOSIS — I700 Atherosclerosis of aorta: Secondary | ICD-10-CM | POA: Insufficient documentation

## 2021-12-29 DIAGNOSIS — Z87891 Personal history of nicotine dependence: Secondary | ICD-10-CM | POA: Insufficient documentation

## 2021-12-29 DIAGNOSIS — J439 Emphysema, unspecified: Secondary | ICD-10-CM | POA: Diagnosis not present

## 2022-01-04 ENCOUNTER — Telehealth: Payer: Self-pay | Admitting: Internal Medicine

## 2022-01-04 NOTE — Telephone Encounter (Signed)
Looked up on Internet and states its a spinal cord stimulator.  Called pt to ask if her neuropathy is that bad she states no.  I told her she probably dont even qualify for it.  I told her if her feet were bothering her she could discuss at next visit.

## 2022-01-04 NOTE — Telephone Encounter (Signed)
Copied from [place] [id number]. Topic: General - Other >> Jan 04, 2022 12:21 PM [id number] J wrote: Reason for CRM: pt called in to ask provider. Pt says that she seen on a tv a procedure for neuropathy called HFX. Pt would like to have provider thoughts on this? Pt would like to try it if possible.   Please assist pt further.

## 2022-01-27 ENCOUNTER — Ambulatory Visit
Admission: RE | Admit: 2022-01-27 | Discharge: 2022-01-27 | Disposition: A | Discharge status: Home / Self Care | Payer: Medicare HMO | Source: Ambulatory Visit | Attending: Internal Medicine | Admitting: Internal Medicine

## 2022-01-27 DIAGNOSIS — Z1231 Encounter for screening mammogram for malignant neoplasm of breast: Secondary | ICD-10-CM | POA: Diagnosis present

## 2022-02-06 ENCOUNTER — Encounter (INDEPENDENT_AMBULATORY_CARE_PROVIDER_SITE_OTHER): Payer: Self-pay

## 2022-02-15 ENCOUNTER — Ambulatory Visit: Payer: Medicare HMO | Admitting: Internal Medicine

## 2022-02-15 ENCOUNTER — Encounter: Payer: Self-pay | Admitting: Internal Medicine

## 2022-02-15 VITALS — BP 132/78 | HR 81 | Temp 97.7°F | Ht 68.0 in | Wt 199.0 lb

## 2022-02-15 DIAGNOSIS — J849 Interstitial pulmonary disease, unspecified: Secondary | ICD-10-CM

## 2022-02-15 NOTE — Patient Instructions (Signed)
FOLLOW UP LUNG CANCER SCREENING PROGRAM AS INSTRUCTED

## 2022-02-15 NOTE — Progress Notes (Signed)
[facility] [place] Pulmonary Medicine Consultation      Date: 02/15/2022,   MRN# [id number] Dana Peters [birth year] Code Status:  Code Status History     Date Active Date Inactive Code Status Order ID Comments User Context   06/25/2017 1225 04/28/2018 1529 Full Code [id number]  Kerman Passey, MD Outpatient      Questions for Most Recent Historical Code Status (Order [id number])           Admission                  Current  Dana Peters is a 76 y.o. old female seen in consultation for abnormal CT chest      CHIEF COMPLAINT:   Assessment for abnormal CT chest Follow up assessment ILD    HISTORY OF PRESENT ILLNESS   76 year old pleasant African-American female seen today for follow-up abnormal CT chest  She is enrolled in the lung cancer screening program or is referred to [name] due to radiology reading of NSIP   We have reviewed several scans over the last 7 years which noted early ILD however patient does not have any respiratory symptoms at this time and is asymptomatic   Patient had recent cold-like symptoms with COVID in positive husband But patient tested negative for COVID    Patient does not have any respiratory issues at this time I have reviewed the CT scans in depth and individually with the patient.  We have reviewed CT scans from April 2017 and current CT scans There is a persistent bilateral peripherally reticulonodular groundglass opacifications most likely related to underlying interstitial lung disease with areas of emphysema and bronchiectasis.  Likely related to her underlying smoking   PAST MEDICAL HISTORY   Past Medical History:  Diagnosis Date   Allergy    Baker's cyst of knee, right 06/04/2017   [name] Feb 2019   CKD (chronic kidney disease) stage 2, GFR 60-89 ml/min    Dyslipidemia    GERD (gastroesophageal reflux disease)    Hypertension    Lumbar spinal stenosis    Neuralgia neuritis, sciatic nerve 08/28/2012   Neuropathy    both feet    Obesity (BMI 30.0-34.9) 05/29/2016   Osteopenia 06/15/2016   March 2018   Personal history of tobacco use, presenting hazards to health 07/14/2015   Reflux    Sciatica    Thoracic aortic atherosclerosis (HCC) 08/01/2016   Chest CT   Venous stasis    Vitamin D deficiency disease    Wears dentures    Full upper, partial lower     SURGICAL HISTORY   Past Surgical History:  Procedure Laterality Date   ABDOMINAL HYSTERECTOMY     complete, due to heavy   CATARACT EXTRACTION, BILATERAL     COLONOSCOPY WITH PROPOFOL N/A 08/29/2019   Procedure: COLONOSCOPY WITH PROPOFOL;  Surgeon: Midge Minium, MD;  Location: [facility];  Service: Endoscopy;  Laterality: N/A;  priority 4   ECTOPIC PREGNANCY SURGERY       FAMILY HISTORY   Family History  Problem Relation Age of Onset   Cancer Mother        colon   Hypertension Mother    Alzheimer's disease Mother    Cancer Father        prostate   Cancer Brother        lung   Stroke Brother    Cancer Brother        prostate and brain   Cancer Brother  throat   Diabetes Paternal Aunt    Breast cancer Paternal Aunt    Healthy Daughter    Cirrhosis Brother      SOCIAL HISTORY   Social History   Tobacco Use   Smoking status: Former    Packs/day: 1.00    Years: 30.00    Total pack years: 30.00    Types: Cigarettes    Quit date: 10/13/2005    Years since quitting: 16.3   Smokeless tobacco: Former    Types: Snuff    Quit date: 10/09/2011  Vaping Use   Vaping Use: Never used  Substance Use Topics   Alcohol use: No    Alcohol/week: 0.0 standard drinks of alcohol   Drug use: No     MEDICATIONS    Home Medication:  Current Outpatient Rx   Order #: [id number] Class: No Print   Order #: [id number] Class: Historical Med   Order #: [id number] Class: Normal   Order #: [id number] Class: Normal   Order #: [id number] Class: Normal   Order #: [id number] Class: Normal    Current Medication:  Current Outpatient Medications:     aspirin EC 81 MG tablet, Take 1 tablet (81 mg total) by mouth daily., Disp: 30 tablet, Rfl: 11   Cholecalciferol (VITAMIN D) 2000 units CAPS, Take by mouth daily., Disp: , Rfl:    DULoxetine (CYMBALTA) 30 MG capsule, Take 1 capsule (30 mg total) by mouth 2 (two) times daily., Disp: 180 capsule, Rfl: 1   ezetimibe (ZETIA) 10 MG tablet, Take 1 tablet (10 mg total) by mouth daily., Disp: 90 tablet, Rfl: 1   loratadine (CLARITIN) 10 MG tablet, Take 1 tablet (10 mg total) by mouth daily as needed., Disp: 90 tablet, Rfl: 3   rosuvastatin (CRESTOR) 5 MG tablet, TAKE 1 TABLET BY MOUTH EVERY OTHER DAY AT BEDTIME FOR  ATHEROSCLEROTIC  DISEASE, Disp: 90 tablet, Rfl: 1    ALLERGIES   Atorvastatin, Codeine sulfate, Pravastatin, Protonix [pantoprazole sodium], Tylenol with codeine #3  [acetaminophen-codeine], and Penicillin g    BP 132/78 (BP Location: Left Arm, Cuff Size: Normal)   Pulse 81   Temp 97.7 F (36.5 C) (Temporal)   Ht 5\' 8"  (1.727 m)   Wt 199 lb (90.3 kg)   LMP  (LMP Unknown)   BMI 30.26 kg/m    Review of Systems: Gen:  Denies  fever, sweats, chills weight loss  HEENT: Denies blurred vision, double vision, ear pain, eye pain, hearing loss, nose bleeds, sore throat Cardiac:  No dizziness, chest pain or heaviness, chest tightness,edema, No JVD Resp:   No cough, -sputum production, -shortness of breath,-wheezing, -hemoptysis,  Other:  All other systems negative    Physical Examination:   General Appearance: No distress  EYES PERRLA, EOM intact.   NECK Supple, No JVD Pulmonary: normal breath sounds, No wheezing.  CardiovascularNormal S1,S2.  No m/r/g.   Abdomen: Benign, Soft, non-tender. ALL OTHER ROS ARE NEGATIVE       IMAGING   CT chest 12/2021    12/2020   ASSESSMENT/PLAN   76 year old pleasant African-American female with abnormal CT chest findings consistent with very mild bilateral reticulonodular opacities in the periphery dating back to 2017 most likely  related to previous smoking history  CT scans reviewed individually with the patient at last office visit Most recent scan was September 2022 going back to April 2017 There is evidence of interstitial lung disease over the last 5 years that has not significantly progressed during that time or interval REPEAT CT CHEST  12/2021 shows similar findings over last several years Follow-up annual lung cancer screening program   We have reviewed CT scans from April 2017 and current CT scans There is a persistent bilateral peripherally reticulonodular groundglass opacifications most likely related to underlying interstitial lung disease with areas of emphysema and bronchiectasis.    Patient does not have any respiratory issues at this time Patient exposed to COVID infection however tested negative  No indication for antibiotics, steroids, inhalers,  bronchoscopy or lung biopsy at this time   CURRENT MEDICATIONS REVIEWED AT LENGTH WITH PATIENT TODAY   Patient  satisfied with Plan of action and management. All questions answered  Follow-up as needed  Total time spent 21 minutes   Lucie Leather, M.D.  [facility]  Medical Director [facility] Medical Director [facility] Cardio-Pulmonary Department

## 2022-03-28 ENCOUNTER — Ambulatory Visit: Payer: Self-pay

## 2022-03-28 NOTE — Progress Notes (Unsigned)
Established Patient Office Visit  Subjective:  Patient ID: Dana Peters, female    DOB: 07-Aug-1945  Age: 76 y.o. MRN: [id number]  CC:  No chief complaint on file.   HPI Dana Peters presents for worsening neuropathy.   Hypertension: -Medications: None, used to be on medication in the past but discontinued because well controlled  -Checking BP at home (average): not checking  -Denies any SOB, CP, vision changes, LE edema or symptoms of hypotension  HLD/CAD: -Medications: Crestor 5 mg every other day, Zetia 10 mg -Patient is compliant with above medications and reports no side effects.  -Last lipid panel: 2/22 Lipid Panel     Component Value Date/Time   CHOL 108 05/20/2021 1103   CHOL 177 05/07/2015 1131   TRIG 72 05/20/2021 1103   HDL 61 05/20/2021 1103   HDL 47 05/07/2015 1131   CHOLHDL 1.8 05/20/2021 1103   VLDL 39 (H) 10/26/2016 1123   LDLCALC 32 05/20/2021 1103   LABVLDL 40 05/07/2015 1131   Neuropathy: -Currently on Cymbalta 60 mg for bilateral neropathy in feet, symptoms well controlled. Increased at [place] and doing well.   GERD: -Not currently on medication -Takes Nexium as needed, only has symptoms with certain foods but now she eats slower which has helped avoid symptoms   CKD2: -Last BMP 5/23 with creatinine 0.85, GFR 71  Vitamin D Deficiency: -Currently on supplementation 2000 IU daily   Health Maintenance: -Blood work UTD -Mammogram 10/22 Bir[place] 1 -DEXA 10/21: t score -1.4 in right femur, -0.6 in lumbar spine -Colonoscopy 5/21, negative -Low dose CT lung - 9/22 Lung Rads-2. Scheduled this year for 12/29/21.   Past Medical History:  Diagnosis Date   Allergy    Baker's cyst of knee, right 06/04/2017   [name] Feb 2019   CKD (chronic kidney disease) stage 2, GFR 60-89 ml/min    Dyslipidemia    GERD (gastroesophageal reflux disease)    Hypertension    Lumbar spinal stenosis    Neuralgia neuritis, sciatic nerve 08/28/2012   Neuropathy     both feet   Obesity (BMI 30.0-34.9) 05/29/2016   Osteopenia 06/15/2016   March 2018   Personal history of tobacco use, presenting hazards to health 07/14/2015   Reflux    Sciatica    Thoracic aortic atherosclerosis ([place]) 08/01/2016   Chest CT   Venous stasis    Vitamin D deficiency disease    Wears dentures    Full upper, partial lower    Past Surgical History:  Procedure Laterality Date   ABDOMINAL HYSTERECTOMY     complete, due to heavy   CATARACT EXTRACTION, BILATERAL     COLONOSCOPY WITH PROPOFOL N/A 08/29/2019   Procedure: COLONOSCOPY WITH PROPOFOL;  Surgeon: [name], MD;  Location: [place];  Service: Endoscopy;  Laterality: N/A;  priority 4   ECTOPIC PREGNANCY SURGERY      Family History  Problem Relation Age of Onset   Cancer Mother        colon   Hypertension Mother    Alzheimer's disease Mother    Cancer Father        prostate   Cancer Brother        lung   Stroke Brother    Cancer Brother        prostate and brain   Cancer Brother        throat   Diabetes Paternal Aunt    Breast cancer Paternal Aunt    Healthy Daughter  Cirrhosis Brother     Social History   Socioeconomic History   Marital status: Married    Spouse name: Not on file   Number of children: 1   Years of education: Not on file   Highest education level: 12th grade  Occupational History   Occupation: retired  Tobacco Use   Smoking status: Former    Packs/day: 1.00    Years: 30.00    Total pack years: 30.00    Types: Cigarettes    Quit date: 10/13/2005    Years since quitting: 16.4   Smokeless tobacco: Former    Types: Snuff    Quit date: 10/09/2011  Vaping Use   Vaping Use: Never used  Substance and Sexual Activity   Alcohol use: No    Alcohol/week: 0.0 standard drinks of alcohol   Drug use: No   Sexual activity: Yes  Other Topics Concern   Not on file  Social History Narrative   Lives with husband in a one story home.  Has one daughter.     Retired from  [employer] work.     Education: high school.   Social Determinants of Health   Financial Resource Strain: Low Risk  (09/13/2021)   Overall Financial Resource Strain (CARDIA)    Difficulty of Paying Living Expenses: Not very hard  Food Insecurity: No Food Insecurity (09/13/2021)   Hunger Vital Sign    Worried About Running Out of Food in the Last Year: Never true    Ran Out of Food in the Last Year: Never true  Transportation Needs: No Transportation Needs (09/13/2021)   PRAPARE - Hydrologist (Medical): No    Lack of Transportation (Non-Medical): No  Physical Activity: Inactive (09/13/2021)   Exercise Vital Sign    Days of Exercise per Week: 0 days    Minutes of Exercise per Session: 0 min  Stress: No Stress Concern Present (09/13/2021)   [facility]    Feeling of Stress : Not at all  Social Connections: Moderately Integrated (09/13/2021)   Social Connection and Isolation Panel [NHANES]    Frequency of Communication with Friends and Family: More than three times a week    Frequency of Social Gatherings with Friends and Family: Three times a week    Attends Religious Services: More than 4 times per year    Active Member of Clubs or Organizations: No    Attends Archivist Meetings: Never    Marital Status: Married  Human resources officer Violence: Not At Risk (09/13/2021)   Humiliation, Afraid, Rape, and Kick questionnaire    Fear of Current or Ex-Partner: No    Emotionally Abused: No    Physically Abused: No    Sexually Abused: No    Outpatient Medications Prior to Visit  Medication Sig Dispense Refill   aspirin EC 81 MG tablet Take 1 tablet (81 mg total) by mouth daily. 30 tablet 11   Cholecalciferol (VITAMIN D) 2000 units CAPS Take by mouth daily.     DULoxetine (CYMBALTA) 30 MG capsule Take 1 capsule (30 mg total) by mouth 2 (two) times daily. 180 capsule 1   ezetimibe (ZETIA) 10 MG tablet  Take 1 tablet (10 mg total) by mouth daily. 90 tablet 1   loratadine (CLARITIN) 10 MG tablet Take 1 tablet (10 mg total) by mouth daily as needed. 90 tablet 3   rosuvastatin (CRESTOR) 5 MG tablet TAKE 1 TABLET BY MOUTH EVERY OTHER  DAY AT BEDTIME FOR  ATHEROSCLEROTIC  DISEASE 90 tablet 1   No facility-administered medications prior to visit.    Allergies  Allergen Reactions   Atorvastatin Itching   Codeine Sulfate Itching   Pravastatin Nausea Only   Protonix [Pantoprazole Sodium] Other (See Comments)    Legs felt heavy   Tylenol With Codeine #3  [Acetaminophen-Codeine] Itching   Penicillin G Rash    ROS Review of Systems  Constitutional:  Negative for chills and fever.  Eyes:  Negative for visual disturbance.  Respiratory:  Negative for cough and shortness of breath.   Cardiovascular:  Negative for chest pain.  Gastrointestinal:  Negative for abdominal pain.  Neurological:  Negative for dizziness and headaches.      Objective:    Physical Exam Constitutional:      Appearance: Normal appearance.  HENT:     Head: Normocephalic and atraumatic.  Eyes:     Conjunctiva/sclera: Conjunctivae normal.  Cardiovascular:     Rate and Rhythm: Normal rate and regular rhythm.  Pulmonary:     Effort: Pulmonary effort is normal.     Breath sounds: Normal breath sounds.  Musculoskeletal:     Right lower leg: No edema.     Left lower leg: No edema.  Skin:    General: Skin is warm and dry.  Neurological:     General: No focal deficit present.     Mental Status: She is alert. Mental status is at baseline.  Psychiatric:        Mood and Affect: Mood normal.        Behavior: Behavior normal.     LMP  (LMP Unknown)  Wt Readings from Last 3 Encounters:  02/15/22 199 lb (90.3 kg)  11/17/21 205 lb 1.6 oz (93 kg)  09/06/21 203 lb (92.1 kg)     Health Maintenance Due  Topic Date Due   COVID-19 Vaccine (5 - 2023-24 season) 12/09/2021   DEXA SCAN  01/25/2022    There are no  preventive care reminders to display for this patient.  Lab Results  Component Value Date   TSH 1.44 10/05/2016   Lab Results  Component Value Date   WBC 6.0 09/06/2021   HGB 13.1 09/06/2021   HCT 38.6 09/06/2021   MCV 96.5 09/06/2021   PLT 179 09/06/2021   Lab Results  Component Value Date   NA 140 09/06/2021   K 4.3 09/06/2021   CO2 24 09/06/2021   GLUCOSE 101 (H) 09/06/2021   BUN 13 09/06/2021   CREATININE 0.85 09/06/2021   BILITOT 0.5 09/06/2021   ALKPHOS 80 05/28/2017   AST 18 09/06/2021   ALT 17 09/06/2021   PROT 6.8 09/06/2021   ALBUMIN 4.1 05/28/2017   CALCIUM 9.8 09/06/2021   ANIONGAP 6 05/28/2017   EGFR 71 09/06/2021   Lab Results  Component Value Date   CHOL 108 05/20/2021   Lab Results  Component Value Date   HDL 61 05/20/2021   Lab Results  Component Value Date   LDLCALC 32 05/20/2021   Lab Results  Component Value Date   TRIG 72 05/20/2021   Lab Results  Component Value Date   CHOLHDL 1.8 05/20/2021   Lab Results  Component Value Date   HGBA1C 5.6 09/10/2019      Assessment & Plan:   1. Dyslipidemia/PAD (peripheral artery disease) (HCC)/Medication refill/Coronary artery calcification seen on CT scan: Reviewed cholesterol panel with the patient from February.  Cholesterol well-controlled.  Continue Zetia 10 mg and Crestor  5 mg every other day, refilled today.  Follow-up in 6 months or sooner as needed.  - ezetimibe (ZETIA) 10 MG tablet; Take 1 tablet (10 mg total) by mouth daily.  Dispense: 90 tablet; Refill: 1 - rosuvastatin (CRESTOR) 5 MG tablet; TAKE 1 TABLET BY MOUTH EVERY OTHER DAY AT BEDTIME FOR  ATHEROSCLEROTIC  DISEASE  Dispense: 90 tablet; Refill: 1  2. Elevated blood pressure reading: Slightly elevated today.  She had been diagnosed with hypertension in the past and had been on medications, however this was discontinued as her blood pressure was better controlled.  Continue to monitor.  3. Neuropathy of both feet: Stable.   Continue Cymbalta 60 mg daily.  4. Gastroesophageal reflux disease without esophagitis: Stable.  Does not have to take medications to control symptoms, is working on lifestyle management.  5. Breast cancer screening by mammogram: Mammogram will be due in the fall.  - MM Digital Screening; Future   Follow-up: No follow-ups on file.    Teodora Medici, DO

## 2022-03-28 NOTE — Telephone Encounter (Signed)
  Chief Complaint: numbness Symptoms: numbness and tingling in feet and hands, worsening Frequency: 2-3 days Pertinent Negatives: NA Disposition: [] ED /[] Urgent Care (no appt availability in office) / [x] Appointment(In office/virtual)/ []  [facility]/ [] Home Care/ [] Refused Recommended Disposition /[] [facility]/ []  Follow-up with PCP Additional Notes: pt states neuropathy getting worse, has seen advertisement for [facility] doing new treatment for neuropathy but pt not going until after christmas. Wanting to see what she can do to help with pain. Scheduled OV for tomorrow at 1000 with PCP.   Reason for Disposition  [1] Tingling (e.g., pins and needles) of the face, arm / hand, or leg / foot on one side of the body AND [2] present now (Exceptions: Chronic or recurrent symptom lasting > 4 weeks; or tingling from known cause, such as: bumped elbow, carpal tunnel syndrome, pinched nerve, frostbite.)  Answer Assessment - Initial Assessment Questions 1. SYMPTOM: "What is the main symptom you are concerned about?" (e.g., weakness, numbness)     Numbness and tingling both feet and hands 2. ONSET: "When did this start?" (minutes, hours, days; while sleeping)     2-3 days  4. PATTERN "Does this come and go, or has it been constant since it started?"  "Is it present now?"     Comes and goes  7. OTHER SYMPTOMS: "Do you have any other symptoms?"     Itchy eyes  Protocols used: Neurologic Deficit-A-AH

## 2022-03-29 ENCOUNTER — Ambulatory Visit (INDEPENDENT_AMBULATORY_CARE_PROVIDER_SITE_OTHER): Payer: Medicare HMO | Admitting: Internal Medicine

## 2022-03-29 ENCOUNTER — Encounter: Payer: Self-pay | Admitting: Internal Medicine

## 2022-03-29 VITALS — BP 136/72 | HR 81 | Temp 98.4°F | Resp 18 | Ht 68.0 in | Wt 196.7 lb

## 2022-03-29 DIAGNOSIS — E559 Vitamin D deficiency, unspecified: Secondary | ICD-10-CM

## 2022-03-29 DIAGNOSIS — R7309 Other abnormal glucose: Secondary | ICD-10-CM | POA: Diagnosis not present

## 2022-03-29 DIAGNOSIS — G5793 Unspecified mononeuropathy of bilateral lower limbs: Secondary | ICD-10-CM

## 2022-03-29 LAB — POCT GLYCOSYLATED HEMOGLOBIN (HGB A1C): Hemoglobin A1C: 5.5 % (ref 4.0–5.6)

## 2022-03-29 MED ORDER — DULOXETINE HCL 30 MG PO CPEP: 30.0000 mg | ORAL_CAPSULE | Freq: Two times a day (BID) | ORAL | 1 refills | Status: DC

## 2022-03-29 MED ORDER — PREGABALIN 25 MG PO CAPS: 25.0000 mg | ORAL_CAPSULE | Freq: Every day | ORAL | 0 refills | Status: DC

## 2022-03-29 NOTE — Patient Instructions (Addendum)
It was great seeing you today!  Plan discussed at today's visit: -Blood work today.  -Continue Duloxetine 60 mg in the morning, start Lyrica 25 mg at night -Plan for labs at follow up to recheck kidney function -A1c 5.5 which is excellent, no diabetes -referral to Neurology placed   Follow up in: 1 month   Take care and let us know if you have any questions or concerns prior to your next visit.  Dr. Caralee Ates

## 2022-04-04 LAB — VITAMIN B1: Vitamin B1 (Thiamine): 77 nmol/L — ABNORMAL HIGH (ref 8–30)

## 2022-04-04 LAB — VITAMIN D 25 HYDROXY (VIT D DEFICIENCY, FRACTURES): Vit D, 25-Hydroxy: 48 ng/mL (ref 30–100)

## 2022-04-04 LAB — B12 AND FOLATE PANEL
Folate: 17.9 ng/mL
Vitamin B-12: 406 pg/mL (ref 200–1100)

## 2022-04-04 LAB — BASIC METABOLIC PANEL WITH GFR
BUN: 16 mg/dL (ref 7–25)
CO2: 32 mmol/L (ref 20–32)
Calcium: 9.9 mg/dL (ref 8.6–10.4)
Chloride: 105 mmol/L (ref 98–110)
Creat: 0.93 mg/dL (ref 0.60–1.00)
Glucose, Bld: 95 mg/dL (ref 65–139)
Potassium: 4.7 mmol/L (ref 3.5–5.3)
Sodium: 144 mmol/L (ref 135–146)

## 2022-04-04 LAB — TSH: TSH: 2.59 m[IU]/L (ref 0.40–4.50)

## 2022-04-17 ENCOUNTER — Ambulatory Visit: Payer: Self-pay

## 2022-04-17 NOTE — Telephone Encounter (Signed)
Message from [place] sent at 04/17/2022  3:29 PM EST  Summary: Pt reports that she stopped taking the pregabalin (LYRICA) 25 MG capsule   Pt reports that she stopped taking the pregabalin (LYRICA) 25 MG capsule because it was causing blurred vision and it did not stop her feet from hurting. Pt requests that a nurse return her call to discuss. Cb# [phone]         Called pt and LM on VM to call back to discuss sx.

## 2022-04-17 NOTE — Telephone Encounter (Signed)
Summary: Pt reports that she stopped taking the pregabalin (LYRICA) 25 MG capsule   Pt reports that she stopped taking the pregabalin (LYRICA) 25 MG capsule because it was causing blurred vision and it did not stop her feet from hurting. Pt requests that a nurse return her call to discuss. Cb# [phone]      Called pt - [email or web address]

## 2022-04-17 NOTE — Telephone Encounter (Signed)
Summary: Pt reports that she stopped taking the pregabalin (LYRICA) 25 MG capsule   Pt reports that she stopped taking the pregabalin (LYRICA) 25 MG capsule because it was causing blurred vision and it did not stop her feet from hurting. Pt requests that a nurse return her call to discuss. Cb# [phone]      Called pt - [email or web address] 

## 2022-04-24 ENCOUNTER — Other Ambulatory Visit: Payer: Self-pay

## 2022-04-24 ENCOUNTER — Ambulatory Visit
Admission: EM | Admit: 2022-04-24 | Discharge: 2022-04-24 | Disposition: A | Discharge status: Home / Self Care | Payer: Medicare HMO | Attending: Emergency Medicine | Admitting: Emergency Medicine

## 2022-04-24 DIAGNOSIS — Z1152 Encounter for screening for COVID-19: Secondary | ICD-10-CM | POA: Insufficient documentation

## 2022-04-24 DIAGNOSIS — J069 Acute upper respiratory infection, unspecified: Secondary | ICD-10-CM | POA: Insufficient documentation

## 2022-04-24 LAB — SARS CORONAVIRUS 2 BY RT PCR: SARS Coronavirus 2 by RT PCR: NEGATIVE

## 2022-04-24 MED ORDER — AZITHROMYCIN 250 MG PO TABS: 250.0000 mg | ORAL_TABLET | Freq: Every day | ORAL | 0 refills | Status: DC

## 2022-04-24 MED ORDER — PREDNISONE 10 MG (21) PO TBPK: ORAL_TABLET | Freq: Every day | ORAL | 0 refills | Status: DC

## 2022-04-24 NOTE — Discharge Instructions (Addendum)
Your symptoms today are most likely being caused by a virus and should steadily improve in time it can take up to 7 to 10 days before you truly start to see a turnaround however things will get better  COVID test is negative  May take azithromycin prophylactically to keep bacteria from entering the body, this will not resolve symptoms as they are most likely a virus however it will ideally keep symptoms from worsening  Begin prednisone as directed to help reduce inflammation and irritation to the airway which will help to relax your breathing    You can take Tylenol and/or Ibuprofen as needed for fever reduction and pain relief.   For cough: honey 1/2 to 1 teaspoon (you can dilute the honey in water or another fluid).  You can also use guaifenesin and dextromethorphan for cough. You can use a humidifier for chest congestion and cough.  If you don't have a humidifier, you can sit in the bathroom with the hot shower running.      For sore throat: try warm salt water gargles, cepacol lozenges, throat spray, warm tea or water with lemon/honey, popsicles or ice, or OTC cold relief medicine for throat discomfort.   For congestion: take a daily anti-histamine like Zyrtec, Claritin, and a oral decongestant, such as pseudoephedrine.  You can also use Flonase 1-2 sprays in each nostril daily.   It is important to stay hydrated: drink plenty of fluids (water, gatorade/powerade/pedialyte, juices, or teas) to keep your throat moisturized and help further relieve irritation/discomfort.

## 2022-04-24 NOTE — ED Provider Notes (Signed)
[facility]    CSN: [id number] Arrival date & time: 04/24/22  1321      History   Chief Complaint Chief Complaint  Patient presents with   Nasal Congestion    HPI Dana Peters is a 77 y.o. female.   Patient presents for evaluation of nasal congestion, chest congestion, cough and wheezing present for 2 days.  Initially began as a sore throat which has now resolved.  Cough started as productive but now dry.  Tolerating food and liquids.  No known sick contacts.  Has attempted use of Tessalon, Coricidin and Zicam with minimal relief.  History of interstitial lung disease.  Denies shortness of breath, chest pain or tightness.,  Fever or chills or bodyaches.  Past Medical History:  Diagnosis Date   Allergy    Baker's cyst of knee, right 06/04/2017   [name] Feb 2019   CKD (chronic kidney disease) stage 2, GFR 60-89 ml/min    Dyslipidemia    GERD (gastroesophageal reflux disease)    Hypertension    Lumbar spinal stenosis    Neuralgia neuritis, sciatic nerve 08/28/2012   Neuropathy    both feet   Obesity (BMI 30.0-34.9) 05/29/2016   Osteopenia 06/15/2016   March 2018   Personal history of tobacco use, presenting hazards to health 07/14/2015   Reflux    Sciatica    Thoracic aortic atherosclerosis (HCC) 08/01/2016   Chest CT   Venous stasis    Vitamin D deficiency disease    Wears dentures    Full upper, partial lower    Patient Active Problem List   Diagnosis Date Noted   Elevated blood pressure reading 11/17/2021   Class 2 severe obesity with serious comorbidity and body mass index (BMI) of 35.0 to 35.9 in adult, unspecified obesity type (HCC) 05/20/2021   Lymphedema 02/26/2020   Personal history of colonic polyps    Bilateral lower extremity edema 09/27/2017   Emphysema of lung (HCC) 08/14/2017   Ganglion cyst of finger 06/25/2017   Full code status 06/25/2017   Osteoarthritis of knee 06/12/2017   Baker's cyst of knee, right 06/04/2017   Thoracic aortic  atherosclerosis (HCC) 08/01/2016   Osteopenia 06/15/2016   Obesity (BMI 30.0-34.9) 05/29/2016   Urinary hesitancy 02/14/2016   PAD (peripheral artery disease) (HCC) 09/09/2015   Coronary artery calcification seen on CT scan 07/20/2015   Personal history of tobacco use, presenting hazards to health 07/14/2015   Foraminal stenosis of lumbar region 01/28/2015   Facet arthropathy, lumbar 01/28/2015   Spinal stenosis of lumbar region 01/28/2015   Neuropathy of both feet 01/07/2015   Leg pain, bilateral 01/07/2015   Leg heaviness 01/07/2015   CKD (chronic kidney disease) stage 2, GFR 60-89 ml/min 10/17/2014   Allergic rhinitis 10/14/2014   Dyslipidemia 10/14/2014   Hypertension    Vitamin D deficiency disease    GERD (gastroesophageal reflux disease) 02/02/2014    Past Surgical History:  Procedure Laterality Date   ABDOMINAL HYSTERECTOMY     complete, due to heavy   CATARACT EXTRACTION, BILATERAL     COLONOSCOPY WITH PROPOFOL N/A 08/29/2019   Procedure: COLONOSCOPY WITH PROPOFOL;  Surgeon: Midge Minium, MD;  Location: [facility];  Service: Endoscopy;  Laterality: N/A;  priority 4   ECTOPIC PREGNANCY SURGERY      OB History   No obstetric history on file.      Home Medications    Prior to Admission medications   Medication Sig Start Date End Date Taking? Authorizing  Provider  aspirin EC 81 MG tablet Take 1 tablet (81 mg total) by mouth daily. 05/24/15   Arnetha Courser, MD  Cholecalciferol (VITAMIN D) 2000 units CAPS Take by mouth daily.    [provider]  DULoxetine (CYMBALTA) 30 MG capsule Take 1 capsule (30 mg total) by mouth 2 (two) times daily. 03/29/22   Teodora Medici, DO  ezetimibe (ZETIA) 10 MG tablet Take 1 tablet (10 mg total) by mouth daily. 11/17/21   Teodora Medici, DO  loratadine (CLARITIN) 10 MG tablet Take 1 tablet (10 mg total) by mouth daily as needed. 05/17/20   Delsa Grana, PA-C  pregabalin (LYRICA) 25 MG capsule Take 1 capsule (25 mg  total) by mouth daily. 03/29/22   Teodora Medici, DO  rosuvastatin (CRESTOR) 5 MG tablet TAKE 1 TABLET BY MOUTH EVERY OTHER DAY AT BEDTIME FOR  ATHEROSCLEROTIC  DISEASE 11/17/21   Teodora Medici, DO    Family History Family History  Problem Relation Age of Onset   Cancer Mother        colon   Hypertension Mother    Alzheimer's disease Mother    Cancer Father        prostate   Cancer Brother        lung   Stroke Brother    Cancer Brother        prostate and brain   Cancer Brother        throat   Diabetes Paternal Aunt    Breast cancer Paternal Aunt    Healthy Daughter    Cirrhosis Brother     Social History Social History   Tobacco Use   Smoking status: Former    Packs/day: 1.00    Years: 30.00    Total pack years: 30.00    Types: Cigarettes    Quit date: 10/13/2005    Years since quitting: 16.5   Smokeless tobacco: Former    Types: Snuff    Quit date: 10/09/2011  Vaping Use   Vaping Use: Never used  Substance Use Topics   Alcohol use: No    Alcohol/week: 0.0 standard drinks of alcohol   Drug use: Yes    Comment: rare     Allergies   Atorvastatin, Codeine sulfate, Pravastatin, Protonix [pantoprazole sodium], Tylenol with codeine #3  [acetaminophen-codeine], and Penicillin g   Review of Systems Review of Systems  Constitutional: Negative.   HENT:  Positive for congestion, rhinorrhea and sore throat. Negative for dental problem, drooling, ear discharge, ear pain, facial swelling, hearing loss, mouth sores, nosebleeds, postnasal drip, sinus pressure, sinus pain, sneezing, tinnitus, trouble swallowing and voice change.   Respiratory:  Positive for cough and wheezing. Negative for apnea, choking, chest tightness, shortness of breath and stridor.   Cardiovascular: Negative.   Gastrointestinal: Negative.      Physical Exam Triage Vital Signs ED Triage Vitals  Enc Vitals Group     BP 04/24/22 1412 (!) 148/90     Pulse Rate 04/24/22 1412 71     Resp  04/24/22 1412 18     Temp 04/24/22 1412 98.4 F (36.9 C)     Temp Source 04/24/22 1412 Oral     SpO2 04/24/22 1412 98 %     Weight 04/24/22 1409 192 lb (87.1 kg)     Height 04/24/22 1409 5\' 8"  (1.727 m)     Head Circumference --      Peak Flow --      Pain Score 04/24/22 1409 0  Pain Loc --      Pain Edu? --      Excl. in GC? --    No data found.  Updated Vital Signs BP (!) 148/90 (BP Location: Right Arm)   Pulse 71   Temp 98.4 F (36.9 C) (Oral)   Resp 18   Ht 5\' 8"  (1.727 m)   Wt 192 lb (87.1 kg)   LMP  (LMP Unknown)   SpO2 98%   BMI 29.19 kg/m   Visual Acuity Right Eye Distance:   Left Eye Distance:   Bilateral Distance:    Right Eye Near:   Left Eye Near:    Bilateral Near:     Physical Exam Constitutional:      Appearance: Normal appearance.  HENT:     Right Ear: Tympanic membrane, ear canal and external ear normal.     Left Ear: Tympanic membrane, ear canal and external ear normal.     Nose: Congestion and rhinorrhea present.     Mouth/Throat:     Mouth: Mucous membranes are moist.     Pharynx: Posterior oropharyngeal erythema present.  Cardiovascular:     Rate and Rhythm: Normal rate and regular rhythm.     Pulses: Normal pulses.     Heart sounds: Normal heart sounds.  Pulmonary:     Effort: Pulmonary effort is normal.     Breath sounds: Normal breath sounds.  Skin:    General: Skin is warm and dry.  Neurological:     Mental Status: She is alert and oriented to person, place, and time. Mental status is at baseline.    UC Treatments / Results  Labs (all labs ordered are listed, but only abnormal results are displayed) Labs Reviewed  SARS CORONAVIRUS 2 BY RT PCR    EKG   Radiology No results found.  Procedures Procedures (including critical care time)  Medications Ordered in UC Medications - No data to display  Initial Impression / Assessment and Plan / UC Course  I have reviewed the triage vital signs and the nursing  notes.  Pertinent labs & imaging results that were available during my care of the patient were reviewed by me and considered in my medical decision making (see chart for details).  Viral URI with cough  Patient is in no signs of distress nor toxic appearing.  Vital signs are stable.  Low suspicion for pneumonia, pneumothorax or bronchitis and therefore will defer imaging.  COVID test negative.  Influenza testing deferred due to lack of fever.  Prescribed azithromycin prophylactically based on history and prednisone as patient endorses she has had consistent with this medication in the past. May use additional over-the-counter medications as needed for supportive care.  May follow-up with urgent care as needed if symptoms persist or worsen.  Note given.   Final Clinical Impressions(s) / UC Diagnoses   Final diagnoses:  None     Discharge Instructions      Your symptoms today are most likely being caused by a virus and should steadily improve in time it can take up to 7 to 10 days before you truly start to see a turnaround however things will get better  May take azithromycin prophylactically to keep bacteria from entering the body, this will not resolve symptoms as they are most likely a virus however it will ideally keep symptoms from worsening    You can take Tylenol and/or Ibuprofen as needed for fever reduction and pain relief.   For cough: honey 1/2 to  1 teaspoon (you can dilute the honey in water or another fluid).  You can also use guaifenesin and dextromethorphan for cough. You can use a humidifier for chest congestion and cough.  If you don't have a humidifier, you can sit in the bathroom with the hot shower running.      For sore throat: try warm salt water gargles, cepacol lozenges, throat spray, warm tea or water with lemon/honey, popsicles or ice, or OTC cold relief medicine for throat discomfort.   For congestion: take a daily anti-histamine like Zyrtec, Claritin, and a  oral decongestant, such as pseudoephedrine.  You can also use Flonase 1-2 sprays in each nostril daily.   It is important to stay hydrated: drink plenty of fluids (water, gatorade/powerade/pedialyte, juices, or teas) to keep your throat moisturized and help further relieve irritation/discomfort.    ED Prescriptions   None    PDMP not reviewed this encounter.   Hans Eden, NP 04/24/22 [phone]

## 2022-04-24 NOTE — ED Triage Notes (Signed)
Pt reports 2 days of symptoms which started out with sore throat but that has resolved. Endorses nasal drainage and chest congestion.

## 2022-04-26 NOTE — Progress Notes (Signed)
Established Patient Office Visit  Subjective:  Patient ID: Dana Peters, female    DOB: Oct 22, 1945  Age: 77 y.o. MRN: [id number]  CC:  Chief Complaint  Patient presents with   Follow-up    HPI [name] presents for follow up on neuropathy and chronic medical conditions.   Neuropathy: -Symptoms on bilateral bottoms of feet bilateral, going on for 20 years but had been getting worse at LOV  Treatments attempted: Had been on Gabapentin before but was discontinued due to an issue with her kidneys  -Labs from 03/29/22 normal.  -She is currently on Cymbalta 60 mg, increased at LOV with Lyrica 25 mg added at bedtime. Patient states the Lyrica cause blurred vision and swelling in her legs so she discontinued it.  -Today she states her neuropathy symptoms have improved.   HLD/PAD/CAD: -Medications: Crestor 5 mg, Zetia 10 mg, aspirin 81 mg -Patient is compliant with above medications and reports no side effects.  -Last lipid panel: Lipid Panel     Component Value Date/Time   CHOL 108 05/20/2021 1103   CHOL 177 05/07/2015 1131   TRIG 72 05/20/2021 1103   HDL 61 05/20/2021 1103   HDL 47 05/07/2015 1131   CHOLHDL 1.8 05/20/2021 1103   VLDL 39 (H) 10/26/2016 1123   LDLCALC 32 05/20/2021 1103   LABVLDL 40 05/07/2015 1131   Environmental Allergies: -Currently Claritin 10 mg, which controls symptoms well.   GERD: -Not currently on medication, diet controlled by avoiding triggering foods.  Vitamin D Deficiency: -Currently on 2000 IU daily -Last vitamin D level 48 12/23  Health Maintenance: -Blood work due -Mammogram 10/23 Birads-1 -DEXA due -Colonoscopy 5/21 negative -Lung cancer screening: 9/23 Lung-RADS 2   Past Medical History:  Diagnosis Date   Allergy    Baker's cyst of knee, right 06/04/2017   [name] Feb 2019   CKD (chronic kidney disease) stage 2, GFR 60-89 ml/min    Dyslipidemia    GERD (gastroesophageal reflux disease)    Hypertension    Lumbar  spinal stenosis    Neuralgia neuritis, sciatic nerve 08/28/2012   Neuropathy    both feet   Obesity (BMI 30.0-34.9) 05/29/2016   Osteopenia 06/15/2016   March 2018   Personal history of tobacco use, presenting hazards to health 07/14/2015   Reflux    Sciatica    Thoracic aortic atherosclerosis ([place]) 08/01/2016   Chest CT   Venous stasis    Vitamin D deficiency disease    Wears dentures    Full upper, partial lower    Past Surgical History:  Procedure Laterality Date   ABDOMINAL HYSTERECTOMY     complete, due to heavy   CATARACT EXTRACTION, BILATERAL     COLONOSCOPY WITH PROPOFOL N/A 08/29/2019   Procedure: COLONOSCOPY WITH PROPOFOL;  Surgeon: [name], MD;  Location: [place];  Service: Endoscopy;  Laterality: N/A;  priority 4   ECTOPIC PREGNANCY SURGERY      Family History  Problem Relation Age of Onset   Cancer Mother        colon   Hypertension Mother    Alzheimer's disease Mother    Cancer Father        prostate   Cancer Brother        lung   Stroke Brother    Cancer Brother        prostate and brain   Cancer Brother        throat   Diabetes Paternal Aunt  Breast cancer Paternal Aunt    Healthy Daughter    Cirrhosis Brother     Social History   Socioeconomic History   Marital status: Married    Spouse name: Not on file   Number of children: 1   Years of education: Not on file   Highest education level: 12th grade  Occupational History   Occupation: retired  Tobacco Use   Smoking status: Former    Packs/day: 1.00    Years: 30.00    Total pack years: 30.00    Types: Cigarettes    Quit date: 10/13/2005    Years since quitting: 16.5   Smokeless tobacco: Former    Types: Snuff    Quit date: 10/09/2011  Vaping Use   Vaping Use: Never used  Substance and Sexual Activity   Alcohol use: No    Alcohol/week: 0.0 standard drinks of alcohol   Drug use: Yes    Comment: rare   Sexual activity: Yes  Other Topics Concern   Not on file  Social  History Narrative   Lives with husband in a one story home.  Has one daughter.     Retired from [employer] work.     Education: high school.   Social Determinants of Health   Financial Resource Strain: Low Risk  (09/13/2021)   Overall Financial Resource Strain (CARDIA)    Difficulty of Paying Living Expenses: Not very hard  Food Insecurity: No Food Insecurity (09/13/2021)   Hunger Vital Sign    Worried About Running Out of Food in the Last Year: Never true    Ran Out of Food in the Last Year: Never true  Transportation Needs: No Transportation Needs (09/13/2021)   PRAPARE - Hydrologist (Medical): No    Lack of Transportation (Non-Medical): No  Physical Activity: Inactive (09/13/2021)   Exercise Vital Sign    Days of Exercise per Week: 0 days    Minutes of Exercise per Session: 0 min  Stress: No Stress Concern Present (09/13/2021)   [place]    Feeling of Stress : Not at all  Social Connections: Moderately Integrated (09/13/2021)   Social Connection and Isolation Panel [NHANES]    Frequency of Communication with Friends and Family: More than three times a week    Frequency of Social Gatherings with Friends and Family: Three times a week    Attends Religious Services: More than 4 times per year    Active Member of Clubs or Organizations: No    Attends Archivist Meetings: Never    Marital Status: Married  Human resources officer Violence: Not At Risk (09/13/2021)   Humiliation, Afraid, Rape, and Kick questionnaire    Fear of Current or Ex-Partner: No    Emotionally Abused: No    Physically Abused: No    Sexually Abused: No    Outpatient Medications Prior to Visit  Medication Sig Dispense Refill   aspirin EC 81 MG tablet Take 1 tablet (81 mg total) by mouth daily. 30 tablet 11   azithromycin (ZITHROMAX) 250 MG tablet Take 1 tablet (250 mg total) by mouth daily. Take first 2 tablets together,  then 1 every day until finished. 6 tablet 0   Cholecalciferol (VITAMIN D) 2000 units CAPS Take by mouth daily.     DULoxetine (CYMBALTA) 30 MG capsule Take 1 capsule (30 mg total) by mouth 2 (two) times daily. 180 capsule 1   ezetimibe (ZETIA) 10 MG tablet  Take 1 tablet (10 mg total) by mouth daily. 90 tablet 1   loratadine (CLARITIN) 10 MG tablet Take 1 tablet (10 mg total) by mouth daily as needed. 90 tablet 3   predniSONE (STERAPRED UNI-PAK 21 TAB) 10 MG (21) TBPK tablet Take by mouth daily. Take 6 tabs by mouth daily  for 1 days, then 5 tabs for 1 days, then 4 tabs for  days, then 3 tabs for 1 days, 2 tabs for 1 days, then 1 tab by mouth daily for 1 days 21 tablet 0   rosuvastatin (CRESTOR) 5 MG tablet TAKE 1 TABLET BY MOUTH EVERY OTHER DAY AT BEDTIME FOR  ATHEROSCLEROTIC  DISEASE 90 tablet 1   pregabalin (LYRICA) 25 MG capsule Take 1 capsule (25 mg total) by mouth daily. (Patient not taking: Reported on 04/27/2022) 30 capsule 0   No facility-administered medications prior to visit.    Allergies  Allergen Reactions   Atorvastatin Itching   Codeine Sulfate Itching   Pravastatin Nausea Only   Protonix [Pantoprazole Sodium] Other (See Comments)    Legs felt heavy   Tylenol With Codeine #3  [Acetaminophen-Codeine] Itching   Penicillin G Rash    ROS Review of Systems  Constitutional:  Negative for chills and fever.  Eyes:  Negative for visual disturbance.  Respiratory:  Negative for cough and shortness of breath.   Cardiovascular:  Negative for chest pain.  Gastrointestinal:  Negative for abdominal pain.  Neurological:  Negative for dizziness, weakness and headaches.      Objective:    Physical Exam Constitutional:      Appearance: Normal appearance.  HENT:     Head: Normocephalic and atraumatic.  Eyes:     Conjunctiva/sclera: Conjunctivae normal.  Cardiovascular:     Rate and Rhythm: Normal rate and regular rhythm.  Pulmonary:     Effort: Pulmonary effort is normal.      Breath sounds: Normal breath sounds.  Musculoskeletal:     Right lower leg: No edema.     Left lower leg: No edema.  Skin:    General: Skin is warm and dry.  Neurological:     General: No focal deficit present.     Mental Status: She is alert. Mental status is at baseline.  Psychiatric:        Mood and Affect: Mood normal.        Behavior: Behavior normal.     BP 136/80   Pulse 74   Temp 98.2 F (36.8 C) (Oral)   Resp 16   Ht 5\' 8"  (1.727 m)   Wt 199 lb 1.6 oz (90.3 kg)   LMP  (LMP Unknown)   SpO2 93%   BMI 30.27 kg/m  Wt Readings from Last 3 Encounters:  04/27/22 199 lb 1.6 oz (90.3 kg)  04/24/22 192 lb (87.1 kg)  03/29/22 196 lb 11.2 oz (89.2 kg)     Health Maintenance Due  Topic Date Due   COVID-19 Vaccine (5 - 2023-24 season) 12/09/2021   DEXA SCAN  01/25/2022    There are no preventive care reminders to display for this patient.  Lab Results  Component Value Date   TSH 2.59 03/29/2022   Lab Results  Component Value Date   WBC 6.0 09/06/2021   HGB 13.1 09/06/2021   HCT 38.6 09/06/2021   MCV 96.5 09/06/2021   PLT 179 09/06/2021   Lab Results  Component Value Date   NA 144 03/29/2022   K 4.7 03/29/2022   CO2 32  03/29/2022   GLUCOSE 95 03/29/2022   BUN 16 03/29/2022   CREATININE 0.93 03/29/2022   BILITOT 0.5 09/06/2021   ALKPHOS 80 05/28/2017   AST 18 09/06/2021   ALT 17 09/06/2021   PROT 6.8 09/06/2021   ALBUMIN 4.1 05/28/2017   CALCIUM 9.9 03/29/2022   ANIONGAP 6 05/28/2017   EGFR 71 09/06/2021   Lab Results  Component Value Date   CHOL 108 05/20/2021   Lab Results  Component Value Date   HDL 61 05/20/2021   Lab Results  Component Value Date   LDLCALC 32 05/20/2021   Lab Results  Component Value Date   TRIG 72 05/20/2021   Lab Results  Component Value Date   CHOLHDL 1.8 05/20/2021   Lab Results  Component Value Date   HGBA1C 5.5 03/29/2022      Assessment & Plan:   1. Dyslipidemia/Coronary artery calcification seen  on CT scan/PAD (peripheral artery disease) (HCC): Recheck fasting lipid panel today, continue Crestor 5 mg and Zetia 10 mg, refilled.   - Lipid Profile - ezetimibe (ZETIA) 10 MG tablet; Take 1 tablet (10 mg total) by mouth daily.  Dispense: 90 tablet; Refill: 1 - rosuvastatin (CRESTOR) 5 MG tablet; TAKE 1 TABLET BY MOUTH EVERY OTHER DAY AT BEDTIME FOR  ATHEROSCLEROTIC  DISEASE  Dispense: 90 tablet; Refill: 1  2. Neuropathy of both feet: Better today, Lyrica caused side effects but doing well on the Cymbalta 60 mg. Due for regular labs.   - Basic Metabolic Panel (BMET) - CBC w/Diff/Platelet  3. Seasonal allergic rhinitis due to pollen: Stable, continue Claritin 10 mg PRN.  4. Postmenopausal estrogen deficiency: DEXA ordered.   - DG Bone Density; Future   Follow-up: Return in about 6 months (around 10/26/2022) for can cancel 2/8 apt and schedule in 6 months from now .    Margarita Mail, DO

## 2022-04-27 ENCOUNTER — Encounter: Payer: Self-pay | Admitting: Internal Medicine

## 2022-04-27 ENCOUNTER — Ambulatory Visit (INDEPENDENT_AMBULATORY_CARE_PROVIDER_SITE_OTHER): Payer: Medicare HMO | Admitting: Internal Medicine

## 2022-04-27 VITALS — BP 136/80 | HR 74 | Temp 98.2°F | Resp 16 | Ht 68.0 in | Wt 199.1 lb

## 2022-04-27 DIAGNOSIS — Z780 Asymptomatic menopausal state: Secondary | ICD-10-CM

## 2022-04-27 DIAGNOSIS — G5793 Unspecified mononeuropathy of bilateral lower limbs: Secondary | ICD-10-CM

## 2022-04-27 DIAGNOSIS — E785 Hyperlipidemia, unspecified: Secondary | ICD-10-CM | POA: Diagnosis not present

## 2022-04-27 DIAGNOSIS — I2510 Atherosclerotic heart disease of native coronary artery without angina pectoris: Secondary | ICD-10-CM

## 2022-04-27 DIAGNOSIS — J301 Allergic rhinitis due to pollen: Secondary | ICD-10-CM | POA: Diagnosis not present

## 2022-04-27 DIAGNOSIS — I739 Peripheral vascular disease, unspecified: Secondary | ICD-10-CM

## 2022-04-27 MED ORDER — ROSUVASTATIN CALCIUM 5 MG PO TABS: ORAL_TABLET | ORAL | 1 refills | Status: DC

## 2022-04-27 MED ORDER — EZETIMIBE 10 MG PO TABS: 10.0000 mg | ORAL_TABLET | Freq: Every day | ORAL | 1 refills | Status: DC

## 2022-04-27 NOTE — Patient Instructions (Addendum)
It was great seeing you today!  Plan discussed at today's visit: -Blood work ordered today, results will be uploaded to [facility].  -Medications refilled -Bone scan ordered  -Referral placed to [facility] in [place] - please call [phone] to schedule   Follow up in: 6 months   Take care and let us know if you have any questions or concerns prior to your next visit.  Dr. Rosana Berger

## 2022-04-28 LAB — CBC WITH DIFFERENTIAL/PLATELET
Absolute Monocytes: 784 {cells}/uL (ref 200–950)
Basophils Absolute: 34 {cells}/uL (ref 0–200)
Basophils Relative: 0.3 %
Eosinophils Absolute: 11 {cells}/uL — ABNORMAL LOW (ref 15–500)
Eosinophils Relative: 0.1 %
HCT: 37.7 % (ref 35.0–45.0)
Hemoglobin: 12.9 g/dL (ref 11.7–15.5)
Lymphs Abs: 2834 {cells}/uL (ref 850–3900)
MCH: 33.1 pg — ABNORMAL HIGH (ref 27.0–33.0)
MCHC: 34.2 g/dL (ref 32.0–36.0)
MCV: 96.7 fL (ref 80.0–100.0)
MPV: 11.7 fL (ref 7.5–12.5)
Monocytes Relative: 7 %
Neutro Abs: 7538 {cells}/uL (ref 1500–7800)
Neutrophils Relative %: 67.3 %
Platelets: 179 10*3/uL (ref 140–400)
RBC: 3.9 Million/uL (ref 3.80–5.10)
RDW: 13.1 % (ref 11.0–15.0)
Total Lymphocyte: 25.3 %
WBC: 11.2 10*3/uL — ABNORMAL HIGH (ref 3.8–10.8)

## 2022-04-28 LAB — LIPID PANEL
Cholesterol: 111 mg/dL
HDL: 68 mg/dL
LDL Cholesterol (Calc): 30 mg/dL
Non-HDL Cholesterol (Calc): 43 mg/dL
Total CHOL/HDL Ratio: 1.6 (calc)
Triglycerides: 57 mg/dL

## 2022-04-28 LAB — BASIC METABOLIC PANEL WITH GFR
BUN: 19 mg/dL (ref 7–25)
CO2: 30 mmol/L (ref 20–32)
Calcium: 9.3 mg/dL (ref 8.6–10.4)
Chloride: 104 mmol/L (ref 98–110)
Creat: 0.97 mg/dL (ref 0.60–1.00)
Glucose, Bld: 77 mg/dL (ref 65–99)
Potassium: 3.9 mmol/L (ref 3.5–5.3)
Sodium: 142 mmol/L (ref 135–146)

## 2022-05-18 ENCOUNTER — Ambulatory Visit: Payer: Medicare HMO | Admitting: Internal Medicine

## 2022-05-19 ENCOUNTER — Ambulatory Visit: Payer: Medicare HMO | Admitting: Internal Medicine

## 2022-06-06 DIAGNOSIS — R200 Anesthesia of skin: Secondary | ICD-10-CM | POA: Diagnosis not present

## 2022-06-06 DIAGNOSIS — R202 Paresthesia of skin: Secondary | ICD-10-CM | POA: Diagnosis not present

## 2022-06-24 ENCOUNTER — Encounter (INDEPENDENT_AMBULATORY_CARE_PROVIDER_SITE_OTHER): Payer: Self-pay

## 2022-06-26 ENCOUNTER — Telehealth: Payer: Self-pay | Admitting: Internal Medicine

## 2022-06-29 ENCOUNTER — Ambulatory Visit (INDEPENDENT_AMBULATORY_CARE_PROVIDER_SITE_OTHER): Payer: Medicare HMO | Admitting: Neurosurgery

## 2022-06-29 DIAGNOSIS — G629 Polyneuropathy, unspecified: Secondary | ICD-10-CM | POA: Diagnosis not present

## 2022-06-29 DIAGNOSIS — M48061 Spinal stenosis, lumbar region without neurogenic claudication: Secondary | ICD-10-CM

## 2022-06-29 NOTE — Progress Notes (Signed)
Neurosurgery Telephone (Audio-Only) Note  Requesting Provider     Teodora Medici, [address] [place],  [place] [address] T: [phone] F: [phone]  Primary Care Provider Teodora Medici, [address] [place] [address] T: [phone] F: [phone]  Telehealth visit was conducted with Dana Peters, a 77 y.o. female via telephone.  History of Present Illness: Dana Peters is a 77 year old presenting today via telephone visit for review of her EMG results.  She continues to have numbness and tingling in her feet which was worse several weeks ago but has since proved.  She denies any radiating leg pain or significant back pain.  Overall her symptoms are largely manageable.  LOV 08/02/21 08/02/2021 Dana Peters is a 77 y.o with a history of emphysema, OA, COPD, peripheral artery disease, peripheral neuropathy, CKD, hyperlipidemia, hypertension, GERD, peripheral edema here today for further evaluation of her lumbar spine. She reports about 14-15 years of numbness in her feet bilaterally which has worsened over the last year or so. She does note a couple of episodes of right-sided sciatica without any significant recurrence in the last 3 or 4 years and these of each resolved on their own without any specific treatment. She is currently only experiencing numbness in her feet bilaterally primarily in the ball of her foot and radiates across the tops of her feet. She states that this numbness is constant but does seem to be more severe at night and in the morning and improves some with movement throughout the day. She denies any numbness or tingling radiating down the length of her leg as well as pain. She has not undergone any conservative treatment or work-up for this. She also denies any back pain.  Conservative measures:  Physical therapy: none  Multimodal medical therapy including regular antiinflammatories: Tylenol and  Aleve as needed Injections: no epidural steroid injections  Past Surgery: no previous spine surgeries  Dana Peters has no symptoms of cervical myelopathy.   General Review of Systems:  A ROS was performed including pertinent positive and negatives as documented.  All other systems are negative.    Prior to Admission medications   Medication Sig Start Date End Date Taking? Authorizing Provider  aspirin EC 81 MG tablet Take 1 tablet (81 mg total) by mouth daily. 05/24/15   Arnetha Courser, MD  azithromycin (ZITHROMAX) 250 MG tablet Take 1 tablet (250 mg total) by mouth daily. Take first 2 tablets together, then 1 every day until finished. 04/24/22   Hans Eden, NP  Cholecalciferol (VITAMIN D) 2000 units CAPS Take by mouth daily.    [provider]  DULoxetine (CYMBALTA) 30 MG capsule Take 1 capsule (30 mg total) by mouth 2 (two) times daily. 03/29/22   Teodora Medici, DO  ezetimibe (ZETIA) 10 MG tablet Take 1 tablet (10 mg total) by mouth daily. 04/27/22   Teodora Medici, DO  loratadine (CLARITIN) 10 MG tablet Take 1 tablet (10 mg total) by mouth daily as needed. 05/17/20   Delsa Grana, PA-C  predniSONE (STERAPRED UNI-PAK 21 TAB) 10 MG (21) TBPK tablet Take by mouth daily. Take 6 tabs by mouth daily  for 1 days, then 5 tabs for 1 days, then 4 tabs for  days, then 3 tabs for 1 days, 2 tabs for 1 days, then 1 tab by mouth daily for 1 days 04/24/22   Lowella Petties R, NP  rosuvastatin (CRESTOR) 5 MG tablet TAKE 1 TABLET BY MOUTH EVERY  OTHER DAY AT BEDTIME FOR  ATHEROSCLEROTIC  DISEASE 04/27/22   Teodora Medici, DO    DATA REVIEWED    Imaging Studies  EMG 06/06/22 Impression: Abnormal study.  There is electrodiagnostic evidence of chronic, severe bilateral lower extremity polyneuropathy   IMPRESSION  Dana Peters is a 77 y.o. female who I performed a telephone encounter today for evaluation and management of: Numbness and tingling in her feet  PLAN  Ms.  Peters is a very pleasant 77 year old presenting today via telephone visit to review her EMG results.  While she does have underlying severe lumbar stenosis, she does not have symptoms concerning of lumbar radiculopathy or neurogenic claudication.  Her symptoms seem to be consistent with her EMG finding of polyneuropathy.  We discussed that this is largely treated with underlying comorbidity treatment and medication management.  I offered a referral to neurology for further discussion of treatment options however she states that her symptoms are manageable at this time.  I do not however think that her symptoms are primarily caused by her lumbar stenosis she does not feel that her symptoms are bad enough at this time to consider any surgical intervention.  She will let us know if she would like referral to neurology in the future.  I will otherwise see her going forward on an as-needed basis.  She expressed understanding was in agreement with this plan.  No orders of the defined types were placed in this encounter.   DISPOSITION  Follow up: In person appointment in  PRN  Loleta Dicker, PA   TELEPHONE DOCUMENTATION   This visit was performed via telephone.  Patient location: home Provider location: office  I spent a total of 5 minutes non-face-to-face activities for this visit on the date of this encounter including review of current clinical condition and response to treatment.  The patient is aware of and accepts the limits of this telehealth visit.

## 2022-08-06 ENCOUNTER — Encounter: Payer: Self-pay | Admitting: Emergency Medicine

## 2022-08-06 ENCOUNTER — Ambulatory Visit
Admission: EM | Admit: 2022-08-06 | Discharge: 2022-08-06 | Disposition: A | Discharge status: Home / Self Care | Payer: Medicare HMO | Attending: Physician Assistant | Admitting: Physician Assistant

## 2022-08-06 DIAGNOSIS — G629 Polyneuropathy, unspecified: Secondary | ICD-10-CM

## 2022-08-06 DIAGNOSIS — M79604 Pain in right leg: Secondary | ICD-10-CM | POA: Diagnosis not present

## 2022-08-06 DIAGNOSIS — M48061 Spinal stenosis, lumbar region without neurogenic claudication: Secondary | ICD-10-CM

## 2022-08-06 DIAGNOSIS — M79605 Pain in left leg: Secondary | ICD-10-CM | POA: Diagnosis not present

## 2022-08-06 MED ORDER — DEXAMETHASONE SODIUM PHOSPHATE 10 MG/ML IJ SOLN
10.0000 mg | Freq: Once | INTRAMUSCULAR | Status: AC
  Administered 2022-08-06: 10 mg via INTRAMUSCULAR

## 2022-08-06 MED ORDER — BACLOFEN 10 MG PO TABS: 10.0000 mg | ORAL_TABLET | Freq: Every evening | ORAL | 0 refills | Status: DC | PRN

## 2022-08-06 NOTE — ED Provider Notes (Signed)
[facility]    CSN: [id number] Arrival date & time: 08/06/22  1349      History   Chief Complaint Chief Complaint  Patient presents with   Back Pain    HPI Dana Peters is a 77 y.o. female with history of polyneuropathy, severe lumbar spinal stenosis, lumbar facet arthropathy, neuralgia of sciatic nerves, peripheral edema and lymphedema, peripheral vascular disease, CKD stage II, hypertension, hyperlipidemia, osteoarthritis of knees, obesity, emphysema, osteopenia, and TAA.   Patient presents today for bilateral posterior leg pain and lower back pain x 3 weeks. States that when she wakes up in the morning she has a lot of tightness in her upper legs for about 30 min. She says it is painful to even bear weight initially.  However, she says that when she starts moving the pain resolves.  Of note, patient was seen by neurosurgery provider on 06/29/2022 for spinal stenosis of the lumbar region and polyneuropathy. Recent EMGs have shown polyneuropathy. Patient was seen last month by neurosurgery for 14 to 15-year history of numbness in bilateral feet with recurrent episodes of sciatica over the past 3 to 4 years.  Patient reported that time that she has treated symptoms conservatively and would like to avoid surgery.  So far she has tried Tylenol and Aleve.  She has never tried physical therapy or ESI's.  Patient did decline a referral to neurology last month, stating that her symptoms were not that bad.  Patient says that a friend of hers sent her information on Facebook about a doctor who could help her symptoms.  She reports that she recently attended a seminar regarding this at a [employer].  She is unsure of the name of the doctor.  She states that the doctor told her he could help her alleviate her symptoms but she says he did not tell her how or how much it would cost.  States he needs to speak with her insurance and gave her information for a follow-up appointment.  She  wanted to know if we thought this doctor could help her or not.  HPI  Past Medical History:  Diagnosis Date   Allergy    Baker's cyst of knee, right 06/04/2017   [name] Feb 2019   CKD (chronic kidney disease) stage 2, GFR 60-89 ml/min    Dyslipidemia    GERD (gastroesophageal reflux disease)    Hypertension    Lumbar spinal stenosis    Neuralgia neuritis, sciatic nerve 08/28/2012   Neuropathy    both feet   Obesity (BMI 30.0-34.9) 05/29/2016   Osteopenia 06/15/2016   March 2018   Personal history of tobacco use, presenting hazards to health 07/14/2015   Reflux    Sciatica    Thoracic aortic atherosclerosis (HCC) 08/01/2016   Chest CT   Venous stasis    Vitamin D deficiency disease    Wears dentures    Full upper, partial lower    Patient Active Problem List   Diagnosis Date Noted   Elevated blood pressure reading 11/17/2021   Class 2 severe obesity with serious comorbidity and body mass index (BMI) of 35.0 to 35.9 in adult, unspecified obesity type (HCC) 05/20/2021   Lymphedema 02/26/2020   Personal history of colonic polyps    Bilateral lower extremity edema 09/27/2017   Emphysema of lung (HCC) 08/14/2017   Ganglion cyst of finger 06/25/2017   Full code status 06/25/2017   Osteoarthritis of knee 06/12/2017   Baker's cyst of knee, right 06/04/2017  Thoracic aortic atherosclerosis (HCC) 08/01/2016   Osteopenia 06/15/2016   Obesity (BMI 30.0-34.9) 05/29/2016   Urinary hesitancy 02/14/2016   PAD (peripheral artery disease) (HCC) 09/09/2015   Coronary artery calcification seen on CT scan 07/20/2015   Personal history of tobacco use, presenting hazards to health 07/14/2015   Foraminal stenosis of lumbar region 01/28/2015   Facet arthropathy, lumbar 01/28/2015   Spinal stenosis of lumbar region 01/28/2015   Neuropathy of both feet 01/07/2015   Leg pain, bilateral 01/07/2015   Leg heaviness 01/07/2015   CKD (chronic kidney disease) stage 2, GFR 60-89 ml/min 10/17/2014    Allergic rhinitis 10/14/2014   Dyslipidemia 10/14/2014   Hypertension    Vitamin D deficiency disease    GERD (gastroesophageal reflux disease) 02/02/2014    Past Surgical History:  Procedure Laterality Date   ABDOMINAL HYSTERECTOMY     complete, due to heavy   CATARACT EXTRACTION, BILATERAL     COLONOSCOPY WITH PROPOFOL N/A 08/29/2019   Procedure: COLONOSCOPY WITH PROPOFOL;  Surgeon: Midge Minium, MD;  Location[facility];  Service: Endoscopy;  Laterality: N/A;  priority 4   ECTOPIC PREGNANCY SURGERY      OB History   No obstetric history on file.      Home Medications    Prior to Admission medications   Medication Sig Start Date End Date Taking? Authorizing Provider  baclofen (LIORESAL) 10 MG tablet Take 1 tablet (10 mg total) by mouth at bedtime as needed for muscle spasms. 08/06/22  Yes Shirlee Latch, PA-C  aspirin EC 81 MG tablet Take 1 tablet (81 mg total) by mouth daily. 05/24/15   Kerman Passey, MD  Cholecalciferol (VITAMIN D) 2000 units CAPS Take by mouth daily.    [provider]  DULoxetine (CYMBALTA) 30 MG capsule Take 1 capsule (30 mg total) by mouth 2 (two) times daily. 03/29/22   Margarita Mail, DO  ezetimibe (ZETIA) 10 MG tablet Take 1 tablet (10 mg total) by mouth daily. 04/27/22   Margarita Mail, DO  loratadine (CLARITIN) 10 MG tablet Take 1 tablet (10 mg total) by mouth daily as needed. 05/17/20   Danelle Berry, PA-C  rosuvastatin (CRESTOR) 5 MG tablet TAKE 1 TABLET BY MOUTH EVERY OTHER DAY AT BEDTIME FOR  ATHEROSCLEROTIC  DISEASE 04/27/22   Margarita Mail, DO    Family History Family History  Problem Relation Age of Onset   Cancer Mother        colon   Hypertension Mother    Alzheimer's disease Mother    Cancer Father        prostate   Cancer Brother        lung   Stroke Brother    Cancer Brother        prostate and brain   Cancer Brother        throat   Diabetes Paternal Aunt    Breast cancer Paternal Aunt     Healthy Daughter    Cirrhosis Brother     Social History Social History   Tobacco Use   Smoking status: Former    Packs/day: 1.00    Years: 30.00    Additional pack years: 0.00    Total pack years: 30.00    Types: Cigarettes    Quit date: 10/13/2005    Years since quitting: 16.8   Smokeless tobacco: Former    Types: Snuff    Quit date: 10/09/2011  Vaping Use   Vaping Use: Never used  Substance Use Topics  Alcohol use: No    Alcohol/week: 0.0 standard drinks of alcohol   Drug use: Yes    Comment: rare     Allergies   Atorvastatin, Codeine sulfate, Lyrica cr [pregabalin er], Pravastatin, Protonix [pantoprazole sodium], Tylenol with codeine #3  [acetaminophen-codeine], and Penicillin g   Review of Systems Review of Systems  Constitutional:  Negative for fatigue.  Gastrointestinal:  Negative for abdominal pain.  Genitourinary:  Negative for decreased urine volume.  Musculoskeletal:  Positive for arthralgias, back pain and myalgias. Negative for gait problem and joint swelling.  Skin:  Negative for color change.  Neurological:  Positive for numbness. Negative for weakness.     Physical Exam Triage Vital Signs ED Triage Vitals  Enc Vitals Group     BP      Pulse      Resp      Temp      Temp src      SpO2      Weight      Height      Head Circumference      Peak Flow      Pain Score      Pain Loc      Pain Edu?      Excl. in GC?    No data found.  Updated Vital Signs BP (!) 160/88 (BP Location: Left Arm)   Pulse 66   Temp 98.6 F (37 C) (Oral)   Resp 14   Ht 5\' 8"  (1.727 m)   Wt 199 lb 1.2 oz (90.3 kg)   LMP  (LMP Unknown)   SpO2 95%   BMI 30.27 kg/m      Physical Exam Vitals and nursing note reviewed.  Constitutional:      General: She is not in acute distress.    Appearance: Normal appearance. She is not ill-appearing or toxic-appearing.  HENT:     Head: Normocephalic and atraumatic.  Eyes:     General: No scleral icterus.       Right  eye: No discharge.        Left eye: No discharge.     Conjunctiva/sclera: Conjunctivae normal.  Cardiovascular:     Rate and Rhythm: Normal rate and regular rhythm.     Heart sounds: Normal heart sounds.  Pulmonary:     Effort: Pulmonary effort is normal. No respiratory distress.     Breath sounds: Normal breath sounds.  Musculoskeletal:     Cervical back: Neck supple.     Lumbar back: No tenderness or bony tenderness. Normal range of motion. Negative right straight leg raise test and negative left straight leg raise test.     Comments: No tenderness of any aspect of the lower back or legs.  5/5 strength of bilateral lower legs.  Normal gait.  Full range of motion but has some discomfort in hamstrings with full forward flexion.  Skin:    General: Skin is dry.  Neurological:     General: No focal deficit present.     Mental Status: She is alert. Mental status is at baseline.     Motor: No weakness.     Gait: Gait normal.  Psychiatric:        Mood and Affect: Mood normal.        Behavior: Behavior normal.        Thought Content: Thought content normal.      UC Treatments / Results  Labs (all labs ordered are listed, but only abnormal results are displayed)  Labs Reviewed - No data to display  EKG   Radiology No results found.  Procedures Procedures (including critical care time)  Medications Ordered in UC Medications  dexamethasone (DECADRON) injection 10 mg (has no administration in time range)    Initial Impression / Assessment and Plan / UC Course  I have reviewed the triage vital signs and the nursing notes.  Pertinent labs & imaging results that were available during my care of the patient were reviewed by me and considered in my medical decision making (see chart for details).   77 year old female with known history of severe lumbar spinal stenosis and polyneuropathy presents for 3-week history of lower back pain with pain radiating into down bilateral posterior  thighs.  Patient reports symptoms are worse in the morning for the first half hour but then start to improve.  Takes gabapentin and Tylenol.  Patient has long history of numbness and tingling in both feet as well.  Seen by neurosurgery 1 month ago and was not having any significant symptoms and declined referral to neurology.  I reviewed patient's recent progress note from neurosurgery a month ago.  Patient's current symptoms are likely related to lumbar radiculopathy/lumbar spinal stenosis and polyneuropathy.  However, there could be an aspect of hamstring tightness and muscle cramping.  Patient reports she had improvement in symptoms with corticosteroid injection in the past.  She has been given 10 mg IM dexamethasone in clinic tonight.  Patient provided with prescription for baclofen to try if the injection has not helped her in the next couple days.  Hopefully if she takes this medication at night, in the morning she will have less muscle stiffness and discomfort.  Also advised her to use heat, stretches.  Encouraged her to follow back up with neurosurgery for next steps. ED precautions discussed.   Final Clinical Impressions(s) / UC Diagnoses   Final diagnoses:  Pain in both lower extremities  Spinal stenosis of lumbar region, unspecified whether neurogenic claudication present  Polyneuropathy     Discharge Instructions      -We have given you an injection of a corticosteroid in the clinic tonight.  Hopefully this reduces some of the inflammation and your leg pain improves.  We discussed that your symptoms are due to a chronic condition called lumbar spinal stenosis. - Some of the discomfort you may be experiencing in the morning could be related to tight hamstring muscles.  See if the corticosteroid injection helps her symptoms over the next 2 days.  If it does not seem to make a difference then try the baclofen muscle relaxer at bedtime and see if you wake up with the same stiffness in the  posterior legs in the morning.  Hopefully it helps.  Continue gabapentin.  Tylenol as needed for pain relief.  Use heat, stretching, muscle rubs.  If this does not help, you should definitely follow back up with neurosurgery for more information on the next steps. - If at any point your back pain acutely worsens, you lose control of your bowels or bladder or have leg weakness you should go to the ER.     ED Prescriptions     Medication Sig Dispense Auth. Provider   baclofen (LIORESAL) 10 MG tablet Take 1 tablet (10 mg total) by mouth at bedtime as needed for muscle spasms. 30 each Shirlee Latch, PA-C      I have reviewed the PDMP during this encounter.   Shirlee Latch, PA-C 08/06/22 1452

## 2022-08-06 NOTE — ED Triage Notes (Signed)
Patient c/o lower back pain that radiates down both of her upper legs for the past 3 weeks.  Patient denies any injury or fall.

## 2022-08-06 NOTE — Discharge Instructions (Addendum)
-  We have given you an injection of a corticosteroid in the clinic tonight.  Hopefully this reduces some of the inflammation and your leg pain improves.  We discussed that your symptoms are due to a chronic condition called lumbar spinal stenosis. - Some of the discomfort you may be experiencing in the morning could be related to tight hamstring muscles.  See if the corticosteroid injection helps her symptoms over the next 2 days.  If it does not seem to make a difference then try the baclofen muscle relaxer at bedtime and see if you wake up with the same stiffness in the posterior legs in the morning.  Hopefully it helps.  Continue gabapentin.  Tylenol as needed for pain relief.  Use heat, stretching, muscle rubs.  If this does not help, you should definitely follow back up with neurosurgery for more information on the next steps. - If at any point your back pain acutely worsens, you lose control of your bowels or bladder or have leg weakness you should go to the ER.

## 2022-08-07 ENCOUNTER — Telehealth: Payer: Self-pay | Admitting: Internal Medicine

## 2022-08-07 NOTE — Telephone Encounter (Signed)
Contacted [name] to schedule their annual wellness visit. Appointment made for 09/08/2022.  [facility] Care Guide [facility] AW[facility] TEAM Direct Dial: [phone]

## 2022-08-15 DIAGNOSIS — M9903 Segmental and somatic dysfunction of lumbar region: Secondary | ICD-10-CM | POA: Diagnosis not present

## 2022-08-15 DIAGNOSIS — M5417 Radiculopathy, lumbosacral region: Secondary | ICD-10-CM | POA: Diagnosis not present

## 2022-08-15 DIAGNOSIS — M62830 Muscle spasm of back: Secondary | ICD-10-CM | POA: Diagnosis not present

## 2022-08-15 DIAGNOSIS — M9904 Segmental and somatic dysfunction of sacral region: Secondary | ICD-10-CM | POA: Diagnosis not present

## 2022-08-16 DIAGNOSIS — M9903 Segmental and somatic dysfunction of lumbar region: Secondary | ICD-10-CM | POA: Diagnosis not present

## 2022-08-16 DIAGNOSIS — M62830 Muscle spasm of back: Secondary | ICD-10-CM | POA: Diagnosis not present

## 2022-08-16 DIAGNOSIS — M5417 Radiculopathy, lumbosacral region: Secondary | ICD-10-CM | POA: Diagnosis not present

## 2022-08-16 DIAGNOSIS — M9904 Segmental and somatic dysfunction of sacral region: Secondary | ICD-10-CM | POA: Diagnosis not present
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
## 2022-08-24 ENCOUNTER — Ambulatory Visit (INDEPENDENT_AMBULATORY_CARE_PROVIDER_SITE_OTHER): Payer: Medicare HMO | Admitting: Internal Medicine

## 2022-08-24 ENCOUNTER — Encounter: Payer: Self-pay | Admitting: Internal Medicine

## 2022-08-24 VITALS — BP 136/82 | HR 87 | Temp 98.2°F | Resp 18 | Ht 68.0 in | Wt 200.3 lb

## 2022-08-24 DIAGNOSIS — G8929 Other chronic pain: Secondary | ICD-10-CM | POA: Diagnosis not present

## 2022-08-24 DIAGNOSIS — M545 Low back pain, unspecified: Secondary | ICD-10-CM | POA: Diagnosis not present

## 2022-08-24 MED ORDER — LIDOCAINE 5 % EX PTCH: 1.0000 | MEDICATED_PATCH | CUTANEOUS | 0 refills | Status: AC

## 2022-08-24 MED ORDER — NAPROXEN 500 MG PO TABS: 500.0000 mg | ORAL_TABLET | Freq: Two times a day (BID) | ORAL | 0 refills | Status: AC

## 2022-08-24 MED ORDER — TIZANIDINE HCL 4 MG PO TABS: 4.0000 mg | ORAL_TABLET | Freq: Four times a day (QID) | ORAL | 0 refills | Status: DC | PRN

## 2022-09-08 ENCOUNTER — Ambulatory Visit (INDEPENDENT_AMBULATORY_CARE_PROVIDER_SITE_OTHER): Payer: Medicare HMO | Admitting: Family Medicine

## 2022-09-08 ENCOUNTER — Telehealth: Payer: Self-pay

## 2022-09-08 ENCOUNTER — Encounter: Payer: Self-pay | Admitting: Family Medicine

## 2022-09-08 VITALS — BP 134/82 | HR 84 | Temp 97.7°F | Resp 16 | Ht 68.0 in | Wt 203.2 lb

## 2022-09-08 DIAGNOSIS — N182 Chronic kidney disease, stage 2 (mild): Secondary | ICD-10-CM | POA: Diagnosis not present

## 2022-09-08 DIAGNOSIS — R82998 Other abnormal findings in urine: Secondary | ICD-10-CM | POA: Diagnosis not present

## 2022-09-08 DIAGNOSIS — M545 Low back pain, unspecified: Secondary | ICD-10-CM

## 2022-09-08 DIAGNOSIS — G8929 Other chronic pain: Secondary | ICD-10-CM

## 2022-09-08 DIAGNOSIS — T887XXA Unspecified adverse effect of drug or medicament, initial encounter: Secondary | ICD-10-CM | POA: Diagnosis not present

## 2022-09-08 NOTE — Progress Notes (Signed)
Patient ID: Dana Peters, female    DOB: 1945/11/28, 77 y.o.   MRN: [id number]  PCP: Margarita Mail, DO  Chief Complaint  Patient presents with   Medication Problem    Zanaflex would make pt urine dark and gave her cramps. Pt states years ago was told by a provider if she took this type of medicine she could have kidney problems. She would like to have test done for kidney. No longer has pain or needs new med.    Subjective:   Dana Peters is a 77 y.o. female, presents to clinic with CC of the following:  HPI   She took an NSAIDs from recent back pain flare, she was also prescribed some steroids and muscle relaxers. She had uriune color changes and she is concerned about her urine and does want her kidney function checked She states she took naproxen but has not taken the muscle relaxers and overall her back feels better than 2 weeks ago she has gone to the chiropractor but that it caused increased pain She has a long history of peripheral neuropathy and she has seen neuro for this, she has not seen Ortho for her back or a physical therapist      Patient Active Problem List   Diagnosis Date Noted   Elevated blood pressure reading 11/17/2021   Class 2 severe obesity with serious comorbidity and body mass index (BMI) of 35.0 to 35.9 in adult, unspecified obesity type (HCC) 05/20/2021   Lymphedema 02/26/2020   Personal history of colonic polyps    Bilateral lower extremity edema 09/27/2017   Emphysema of lung (HCC) 08/14/2017   Ganglion cyst of finger 06/25/2017   Full code status 06/25/2017   Osteoarthritis of knee 06/12/2017   Baker's cyst of knee, right 06/04/2017   Thoracic aortic atherosclerosis (HCC) 08/01/2016   Osteopenia 06/15/2016   Obesity (BMI 30.0-34.9) 05/29/2016   Urinary hesitancy 02/14/2016   PAD (peripheral artery disease) (HCC) 09/09/2015   Coronary artery calcification seen on CT scan 07/20/2015   Personal history of tobacco use,  presenting hazards to health 07/14/2015   Foraminal stenosis of lumbar region 01/28/2015   Facet arthropathy, lumbar 01/28/2015   Spinal stenosis of lumbar region 01/28/2015   Neuropathy of both feet 01/07/2015   Leg pain, bilateral 01/07/2015   Leg heaviness 01/07/2015   CKD (chronic kidney disease) stage 2, GFR 60-89 ml/min 10/17/2014   Allergic rhinitis 10/14/2014   Dyslipidemia 10/14/2014   Hypertension    Vitamin D deficiency disease    GERD (gastroesophageal reflux disease) 02/02/2014      Current Outpatient Medications:    aspirin EC 81 MG tablet, Take 1 tablet (81 mg total) by mouth daily., Disp: 30 tablet, Rfl: 11   Cholecalciferol (VITAMIN D) 2000 units CAPS, Take by mouth daily., Disp: , Rfl:    DULoxetine (CYMBALTA) 30 MG capsule, Take 1 capsule (30 mg total) by mouth 2 (two) times daily., Disp: 180 capsule, Rfl: 1   ezetimibe (ZETIA) 10 MG tablet, Take 1 tablet (10 mg total) by mouth daily., Disp: 90 tablet, Rfl: 1   lidocaine (LIDODERM) 5 %, Place 1 patch onto the skin daily. Remove & Discard patch within 12 hours or as directed by MD, Disp: 30 patch, Rfl: 0   loratadine (CLARITIN) 10 MG tablet, Take 1 tablet (10 mg total) by mouth daily as needed., Disp: 90 tablet, Rfl: 3   rosuvastatin (CRESTOR) 5 MG tablet, TAKE 1 TABLET BY MOUTH EVERY OTHER DAY  AT BEDTIME FOR  ATHEROSCLEROTIC  DISEASE, Disp: 90 tablet, Rfl: 1   tiZANidine (ZANAFLEX) 4 MG tablet, Take 1 tablet (4 mg total) by mouth every 6 (six) hours as needed for muscle spasms. (Patient not taking: Reported on 09/08/2022), Disp: 30 tablet, Rfl: 0   Allergies  Allergen Reactions   Atorvastatin Itching   Codeine Sulfate Itching   Lyrica Cr [Pregabalin Er]     Blurred vision and swelling in legs   Pravastatin Nausea Only   Protonix [Pantoprazole Sodium] Other (See Comments)    Legs felt heavy   Tylenol With Codeine #3  [Acetaminophen-Codeine] Itching   Penicillin G Rash     Social History   Tobacco Use    Smoking status: Former    Packs/day: 1.00    Years: 30.00    Additional pack years: 0.00    Total pack years: 30.00    Types: Cigarettes    Quit date: 10/13/2005    Years since quitting: 16.9   Smokeless tobacco: Former    Types: Snuff    Quit date: 10/09/2011  Vaping Use   Vaping Use: Never used  Substance Use Topics   Alcohol use: No    Alcohol/week: 0.0 standard drinks of alcohol   Drug use: Yes    Comment: rare      Chart Review Today: I personally reviewed active problem list, medication list, allergies, family history, social history, health maintenance, notes from last encounter, lab results, imaging with the patient/caregiver today.   Review of Systems  Constitutional: Negative.   HENT: Negative.    Eyes: Negative.   Respiratory: Negative.    Cardiovascular: Negative.   Gastrointestinal: Negative.   Endocrine: Negative.   Genitourinary: Negative.   Musculoskeletal: Negative.   Skin: Negative.   Allergic/Immunologic: Negative.   Neurological: Negative.   Hematological: Negative.   Psychiatric/Behavioral: Negative.    All other systems reviewed and are negative.      Objective:   Vitals:   09/08/22 1112  BP: 134/82  Pulse: 84  Resp: 16  Temp: 97.7 F (36.5 C)  TempSrc: Oral  SpO2: 98%  Weight: 203 lb 3.2 oz (92.2 kg)  Height: 5\' 8"  (1.727 m)    Body mass index is 30.9 kg/m.  Physical Exam Vitals and nursing note reviewed.  Constitutional:      General: She is not in acute distress.    Appearance: Normal appearance. She is well-developed. She is obese. She is not ill-appearing, toxic-appearing or diaphoretic.  HENT:     Head: Normocephalic and atraumatic.     Nose: Nose normal.  Eyes:     General:        Right eye: No discharge.        Left eye: No discharge.     Conjunctiva/sclera: Conjunctivae normal.  Neck:     Trachea: No tracheal deviation.  Cardiovascular:     Rate and Rhythm: Normal rate and regular rhythm.     Pulses: Normal  pulses.     Heart sounds: Normal heart sounds.  Pulmonary:     Effort: Pulmonary effort is normal. No respiratory distress.     Breath sounds: Normal breath sounds. No stridor.  Musculoskeletal:     Right lower leg: No edema.     Left lower leg: No edema.     Comments: No midline tenderness from cervical to lumbar spine, no step-off, no paraspinal muscle tenderness today on exam from cervical to lumbar spine  Skin:    General: Skin is  warm and dry.     Findings: No rash.  Neurological:     Mental Status: She is alert.     Motor: No abnormal muscle tone.     Coordination: Coordination normal.  Psychiatric:        Mood and Affect: Mood normal.        Behavior: Behavior normal.      Results for orders placed or performed in visit on 04/27/22  Basic Metabolic Panel (BMET)  Result Value Ref Range   Glucose, Bld 77 65 - 99 mg/dL   BUN 19 7 - 25 mg/dL   Creat 1.61 0.96 - 0.45 mg/dL   BUN/Creatinine Ratio SEE NOTE: 6 - 22 (calc)   Sodium 142 135 - 146 mmol/L   Potassium 3.9 3.5 - 5.3 mmol/L   Chloride 104 98 - 110 mmol/L   CO2 30 20 - 32 mmol/L   Calcium 9.3 8.6 - 10.4 mg/dL  Lipid Profile  Result Value Ref Range   Cholesterol 111 <200 mg/dL   HDL 68 > OR = 50 mg/dL   Triglycerides 57 <409 mg/dL   LDL Cholesterol (Calc) 30 mg/dL (calc)   Total CHOL/HDL Ratio 1.6 <5.0 (calc)   Non-HDL Cholesterol (Calc) 43 <811 mg/dL (calc)  CBC w/Diff/Platelet  Result Value Ref Range   WBC 11.2 (H) 3.8 - 10.8 Thousand/uL   RBC 3.90 3.80 - 5.10 Million/uL   Hemoglobin 12.9 11.7 - 15.5 g/dL   HCT 91.4 78.2 - 95.6 %   MCV 96.7 80.0 - 100.0 fL   MCH 33.1 (H) 27.0 - 33.0 pg   MCHC 34.2 32.0 - 36.0 g/dL   RDW 21.3 08.6 - 57.8 %   Platelets 179 140 - 400 Thousand/uL   MPV 11.7 7.5 - 12.5 fL   Neutro Abs 7,538 1,500 - 7,800 cells/uL   Lymphs Abs 2,834 850 - 3,900 cells/uL   Absolute Monocytes 784 200 - 950 cells/uL   Eosinophils Absolute 11 (L) 15 - 500 cells/uL   Basophils Absolute 34 0 -  200 cells/uL   Neutrophils Relative % 67.3 %   Total Lymphocyte 25.3 %   Monocytes Relative 7.0 %   Eosinophils Relative 0.1 %   Basophils Relative 0.3 %       Assessment & Plan:   1. Dark urine She briefly had dark urine after her office visit with her PCP on 5/14 and after she started taking naproxen but her urine has returned to normal and she does have improved back pain She was previously told to avoid naproxen because of its effect on kidney function and she would like her kidney function checked today. She does report that around the time she had a dark urine she did have some suprapubic abdominal cramping and pain, no urinary frequency urgency or hematuria Ddx could include nephrolithiasis? - BASIC METABOLIC PANEL WITH GFR  2. Medication side effect Unclear if NSAID caused SE, she has good renal function and well-controlled blood pressure otherwise there are no NSAID contraindications we will check her kidney function and let her know the results.  I explained to her that if her labs are normal and she does have chronic muscle skeletal pain NSAIDs may still be an option I would just monitor for future urinary changes and come in for more testing on the urine at the time of symptoms Overall she was encouraged to use over-the-counter medications sparingly and only when needed and explained that NSAIDs can increase blood pressure or stress kidneys -  BASIC METABOLIC PANEL WITH GFR  3. Chronic bilateral low back pain without sciatica Back pain is a little better than it was 2 weeks ago, strongly encouraged her to do a assessment with physical therapy and do some treatment with them Referral placed to [facility] clinic physical therapy she is already seeing [facility] providers and they may be able to coordinate her care if ortho vs PM&R is needed  I have recommended caution with chiropractors esp if pain is increased or severe after a chiropractic treatment and last for more than a day or if there  is recommendations for repeated office visits and treatments with no long-term plan - BASIC METABOLIC PANEL WITH GFR - Ambulatory referral to Physical Therapy  4. CKD (chronic kidney disease) stage 2, GFR 60-89 ml/min Lab Results  Component Value Date   EGFR 71 09/06/2021   EGFR 63 05/20/2021   EGFR 65 11/16/2020   Lab Results  Component Value Date   GFRAA 69 05/17/2020   GFRAA 58 (L) 01/29/2020   GFRAA 63 09/29/2019   - BASIC METABOLIC PANEL WITH GFR  F/up as needed     Danelle Berry, PA-C 09/08/22 11:34 AM

## 2022-09-08 NOTE — Telephone Encounter (Signed)
09/08/2022 10:31 AM EDT by Sue Lush, LPN  Outgoing Surenity, Shaya A (Self) [phone] (Mobile) Remove Left Message - Pt scheduled for AWV this am. called to r/s AWV with pt. Left msg to rtn call to our office at [facility]  10:40am- Pt is noted to have just arrived to office for appt with provider. She says she will reschedule the AWV>

## 2022-09-09 LAB — BASIC METABOLIC PANEL WITHOUT GFR
BUN: 20 mg/dL (ref 7–25)
CO2: 28 mmol/L (ref 20–32)
Calcium: 9.7 mg/dL (ref 8.6–10.4)
Chloride: 104 mmol/L (ref 98–110)
Creat: 0.83 mg/dL (ref 0.60–1.00)
Glucose, Bld: 86 mg/dL (ref 65–99)
Potassium: 4.6 mmol/L (ref 3.5–5.3)
Sodium: 142 mmol/L (ref 135–146)
eGFR: 73 mL/min/{1.73_m2}

## 2022-09-12 ENCOUNTER — Telehealth: Payer: Self-pay | Admitting: Internal Medicine

## 2022-09-12 NOTE — Telephone Encounter (Signed)
No answer from pt left detailed vm. °

## 2022-09-12 NOTE — Telephone Encounter (Signed)
Copied from CRM [id number]. Topic: General - Other >> Sep 12, 2022  1:08 PM Pincus Sanes wrote: Reason for CRM: Pt saw L Angelica Chessman this week re issues with her back. Angelica Chessman told her to call her ins company and see if they would cover PT. She called and they will cover pt with a $20.00 co pay and so Ms Cassey wants to have that request sent over to the place Angelica Chessman was speaking of so she can get started.. fu at  [phone]

## 2022-09-27 ENCOUNTER — Other Ambulatory Visit: Payer: Self-pay | Admitting: Internal Medicine

## 2022-09-27 DIAGNOSIS — G5793 Unspecified mononeuropathy of bilateral lower limbs: Secondary | ICD-10-CM

## 2022-09-27 DIAGNOSIS — E785 Hyperlipidemia, unspecified: Secondary | ICD-10-CM

## 2022-09-27 DIAGNOSIS — I739 Peripheral vascular disease, unspecified: Secondary | ICD-10-CM

## 2022-09-27 NOTE — Telephone Encounter (Signed)
Requested Prescriptions  Pending Prescriptions Disp Refills   DULoxetine (CYMBALTA) 30 MG capsule [Pharmacy Med Name: DULoxetine HCl 30 MG Oral Capsule Delayed Release Particles] 180 capsule 0    Sig: Take 1 capsule by mouth twice daily     Psychiatry: Antidepressants - SNRI - duloxetine Passed - 09/27/2022 10:55 AM      Passed - Cr in normal range and within 360 days    Creat  Date Value Ref Range Status  09/08/2022 0.83 0.60 - 1.00 mg/dL Final   Creatinine, Urine  Date Value Ref Range Status  06/04/2017 121 20 - 275 mg/dL Final         Passed - eGFR is 30 or above and within 360 days    GFR, Est African American  Date Value Ref Range Status  05/17/2020 69 > OR = 60 mL/min/1.61m2 Final   GFR, Est Non African American  Date Value Ref Range Status  05/17/2020 59 (L) > OR = 60 mL/min/1.28m2 Final   eGFR  Date Value Ref Range Status  09/08/2022 73 > OR = 60 mL/min/1.71m2 Final         Passed - Completed PHQ-2 or PHQ-9 in the last 360 days      Passed - Last BP in normal range    BP Readings from Last 1 Encounters:  09/08/22 134/82         Passed - Valid encounter within last 6 months    Recent Outpatient Visits           2 weeks ago Dark urine   [facility] Danelle Berry, PA-C   1 month ago Chronic bilateral low back pain without sciatica   [facility] Margarita Mail, DO   5 months ago Dyslipidemia   [facility] Margarita Mail, DO   6 months ago Neuropathy of both feet   [facility] Margarita Mail, DO   10 months ago Dyslipidemia   [facility] Margarita Mail, DO       Future Appointments             In 2 days  [facility]   In 4 weeks Margarita Mail, DO [place] [facility], [facility]             ezetimibe (ZETIA) 10 MG tablet [Pharmacy Med Name: Ezetimibe 10  MG Oral Tablet] 90 tablet 0    Sig: Take 1 tablet by mouth once daily     Cardiovascular:  Antilipid - Sterol Transport Inhibitors Failed - 09/27/2022 10:55 AM      Failed - AST in normal range and within 360 days    AST  Date Value Ref Range Status  09/06/2021 18 10 - 35 U/L Final         Failed - ALT in normal range and within 360 days    ALT  Date Value Ref Range Status  09/06/2021 17 6 - 29 U/L Final         Failed - Lipid Panel in normal range within the last 12 months    Cholesterol, Total  Date Value Ref Range Status  05/07/2015 177 100 - 199 mg/dL Final   Cholesterol  Date Value Ref Range Status  04/27/2022 111 <200 mg/dL Final   LDL Cholesterol (Calc)  Date Value Ref Range Status  04/27/2022 30 mg/dL (calc) Final    Comment:    Reference range: <  100 . Desirable range <100 mg/dL for primary prevention;   <70 mg/dL for patients with CHD or diabetic patients  with > or = 2 CHD risk factors. Marland Kitchen LDL-C is now calculated using the Martin-Hopkins  calculation, which is a validated novel method providing  better accuracy than the Friedewald equation in the  estimation of LDL-C.  Horald Pollen et al. Lenox Ahr. 1610;960(45): 2061-2068  (http://education.[employer].com/faq/FAQ164)    HDL  Date Value Ref Range Status  04/27/2022 68 > OR = 50 mg/dL Final  40/98/1191 47 >47 mg/dL Final   Triglycerides  Date Value Ref Range Status  04/27/2022 57 <150 mg/dL Final         Passed - Patient is not pregnant      Passed - Valid encounter within last 12 months    Recent Outpatient Visits           2 weeks ago Dark urine   [facility] Danelle Berry, PA-C   1 month ago Chronic bilateral low back pain without sciatica   [facility] Margarita Mail, DO   5 months ago Dyslipidemia   [facility] Margarita Mail, DO   6 months ago Neuropathy of both feet   [facility] Margarita Mail, DO   10 months ago Dyslipidemia   [facility] Margarita Mail, DO       Future Appointments             In 2 days  [facility], [facility]   In 4 weeks Margarita Mail, DO [facility]

## 2022-09-29 ENCOUNTER — Ambulatory Visit (INDEPENDENT_AMBULATORY_CARE_PROVIDER_SITE_OTHER): Payer: Medicare HMO

## 2022-09-29 VITALS — Ht 68.0 in | Wt 203.0 lb

## 2022-09-29 DIAGNOSIS — Z0000 Encounter for general adult medical examination without abnormal findings: Secondary | ICD-10-CM

## 2022-09-29 NOTE — Patient Instructions (Signed)
[name] , Thank you for taking time to come for your Medicare Wellness Visit. I appreciate your ongoing commitment to your health goals. Please review the following plan we discussed and let me know if I can assist you in the future.   These are the goals we discussed:  Goals       Increase physical activity (pt-stated)        This is a list of the screening recommended for you and due dates:  Health Maintenance  Topic Date Due   COVID-19 Vaccine (5 - 2023-24 season) 12/09/2021   DEXA scan (bone density measurement)  01/25/2022   Flu Shot  11/09/2022   Mammogram  01/28/2023   Medicare Annual Wellness Visit  09/29/2023   DTaP/Tdap/Td vaccine (3 - Td or Tdap) 01/11/2024   Colon Cancer Screening  08/28/2024   Pneumonia Vaccine  Completed   Hepatitis C Screening  Completed   Zoster (Shingles) Vaccine  Completed   HPV Vaccine  Aged Out    Advanced directives: yes  Conditions/risks identified: low falls risk  Next appointment: Follow up in one year for your annual wellness visit 10/04/2023 @ 8:45am telephone   Preventive Care 65 Years and Older, Female Preventive care refers to lifestyle choices and visits with your health care provider that can promote health and wellness. What does preventive care include? A yearly physical exam. This is also called an annual well check. Dental exams once or twice a year. Routine eye exams. Ask your health care provider how often you should have your eyes checked. Personal lifestyle choices, including: Daily care of your teeth and gums. Regular physical activity. Eating a healthy diet. Avoiding tobacco and drug use. Limiting alcohol use. Practicing safe sex. Taking low-dose aspirin every day. Taking vitamin and mineral supplements as recommended by your health care provider. What happens during an annual well check? The services and screenings done by your health care provider during your annual well check will depend on your age,  overall health, lifestyle risk factors, and family history of disease. Counseling  Your health care provider may ask you questions about your: Alcohol use. Tobacco use. Drug use. Emotional well-being. Home and relationship well-being. Sexual activity. Eating habits. History of falls. Memory and ability to understand (cognition). Work and work Astronomer. Reproductive health. Screening  You may have the following tests or measurements: Height, weight, and BMI. Blood pressure. Lipid and cholesterol levels. These may be checked every 5 years, or more frequently if you are over 68 years old. Skin check. Lung cancer screening. You may have this screening every year starting at age 44 if you have a 30-pack-year history of smoking and currently smoke or have quit within the past 15 years. Fecal occult blood test (FOBT) of the stool. You may have this test every year starting at age 43. Flexible sigmoidoscopy or colonoscopy. You may have a sigmoidoscopy every 5 years or a colonoscopy every 10 years starting at age 73. Hepatitis C blood test. Hepatitis B blood test. Sexually transmitted disease (STD) testing. Diabetes screening. This is done by checking your blood sugar (glucose) after you have not eaten for a while (fasting). You may have this done every 1-3 years. Bone density scan. This is done to screen for osteoporosis. You may have this done starting at age 62. Mammogram. This may be done every 1-2 years. Talk to your health care provider about how often you should have regular mammograms. Talk with your health care provider about your test results,  treatment options, and if necessary, the need for more tests. Vaccines  Your health care provider may recommend certain vaccines, such as: Influenza vaccine. This is recommended every year. Tetanus, diphtheria, and acellular pertussis (Tdap, Td) vaccine. You may need a Td booster every 10 years. Zoster vaccine. You may need this after age  16. Pneumococcal 13-valent conjugate (PCV13) vaccine. One dose is recommended after age 38. Pneumococcal polysaccharide (PPSV23) vaccine. One dose is recommended after age 30. Talk to your health care provider about which screenings and vaccines you need and how often you need them. This information is not intended to replace advice given to you by your health care provider. Make sure you discuss any questions you have with your health care provider. Document Released: 04/23/2015 Document Revised: 12/15/2015 Document Reviewed: 01/26/2015 Elsevier Interactive Patient Education  2017 [employer].  Fall Prevention in the Home Falls can cause injuries. They can happen to people of all ages. There are many things you can do to make your home safe and to help prevent falls. What can I do on the outside of my home? Regularly fix the edges of walkways and driveways and fix any cracks. Remove anything that might make you trip as you walk through a door, such as a raised step or threshold. Trim any bushes or trees on the path to your home. Use bright outdoor lighting. Clear any walking paths of anything that might make someone trip, such as rocks or tools. Regularly check to see if handrails are loose or broken. Make sure that both sides of any steps have handrails. Any raised decks and porches should have guardrails on the edges. Have any leaves, snow, or ice cleared regularly. Use sand or salt on walking paths during winter. Clean up any spills in your garage right away. This includes oil or grease spills. What can I do in the bathroom? Use night lights. Install grab bars by the toilet and in the tub and shower. Do not use towel bars as grab bars. Use non-skid mats or decals in the tub or shower. If you need to sit down in the shower, use a plastic, non-slip stool. Keep the floor dry. Clean up any water that spills on the floor as soon as it happens. Remove soap buildup in the tub or shower  regularly. Attach bath mats securely with double-sided non-slip rug tape. Do not have throw rugs and other things on the floor that can make you trip. What can I do in the bedroom? Use night lights. Make sure that you have a light by your bed that is easy to reach. Do not use any sheets or blankets that are too big for your bed. They should not hang down onto the floor. Have a firm chair that has side arms. You can use this for support while you get dressed. Do not have throw rugs and other things on the floor that can make you trip. What can I do in the kitchen? Clean up any spills right away. Avoid walking on wet floors. Keep items that you use a lot in easy-to-reach places. If you need to reach something above you, use a strong step stool that has a grab bar. Keep electrical cords out of the way. Do not use floor polish or wax that makes floors slippery. If you must use wax, use non-skid floor wax. Do not have throw rugs and other things on the floor that can make you trip. What can I do with my stairs? Do  not leave any items on the stairs. Make sure that there are handrails on both sides of the stairs and use them. Fix handrails that are broken or loose. Make sure that handrails are as long as the stairways. Check any carpeting to make sure that it is firmly attached to the stairs. Fix any carpet that is loose or worn. Avoid having throw rugs at the top or bottom of the stairs. If you do have throw rugs, attach them to the floor with carpet tape. Make sure that you have a light switch at the top of the stairs and the bottom of the stairs. If you do not have them, ask someone to add them for you. What else can I do to help prevent falls? Wear shoes that: Do not have high heels. Have rubber bottoms. Are comfortable and fit you well. Are closed at the toe. Do not wear sandals. If you use a stepladder: Make sure that it is fully opened. Do not climb a closed stepladder. Make sure that  both sides of the stepladder are locked into place. Ask someone to hold it for you, if possible. Clearly mark and make sure that you can see: Any grab bars or handrails. First and last steps. Where the edge of each step is. Use tools that help you move around (mobility aids) if they are needed. These include: Canes. Walkers. Scooters. Crutches. Turn on the lights when you go into a dark area. Replace any light bulbs as soon as they burn out. Set up your furniture so you have a clear path. Avoid moving your furniture around. If any of your floors are uneven, fix them. If there are any pets around you, be aware of where they are. Review your medicines with your doctor. Some medicines can make you feel dizzy. This can increase your chance of falling. Ask your doctor what other things that you can do to help prevent falls. This information is not intended to replace advice given to you by your health care provider. Make sure you discuss any questions you have with your health care provider. Document Released: 01/21/2009 Document Revised: 09/02/2015 Document Reviewed: 05/01/2014 Elsevier Interactive Patient Education  2017 [employer].

## 2022-09-29 NOTE — Progress Notes (Signed)
Subjective:   Dana Peters is a 77 y.o. female who presents for Medicare Annual (Subsequent) preventive examination.  Visit Complete: Virtual  I connected with  Dana Peters on 09/29/22 by a audio enabled telemedicine application and verified that I am speaking with the correct person using two identifiers.  Patient Location: Home  Provider Location: Office/Clinic  I discussed the limitations of evaluation and management by telemedicine. The patient expressed understanding and agreed to proceed.  Patient Medicare AWV questionnaire was completed by the patient on (not done); I have confirmed that all information answered by patient is correct and no changes since this date.  Review of Systems         Objective:    There were no vitals filed for this visit. There is no height or weight on file to calculate BMI.     08/06/2022    2:02 PM 04/24/2022    2:11 PM 09/13/2021    9:59 AM 08/31/2020    9:34 AM 08/29/2019    8:47 AM 07/10/2019    8:59 AM 06/27/2018   11:01 AM  Advanced Directives  Does Patient Have a Medical Advance Directive? Yes Yes Yes No No No Yes  Type of Estate agent of [place];Living will Healthcare Power of [place];Living will Healthcare Power of [place];Living will    Living will;Healthcare Power of Attorney  Does patient want to make changes to medical advance directive?     No - Patient declined    Copy of Healthcare Power of Attorney in Chart?   No - copy requested    No - copy requested  Would patient like information on creating a medical advance directive?    Yes (MAU/Ambulatory/Procedural Areas - Information given)  Yes (MAU/Ambulatory/Procedural Areas - Information given)     Current Medications (verified) Outpatient Encounter Medications as of 09/29/2022  Medication Sig   aspirin EC 81 MG tablet Take 1 tablet (81 mg total) by mouth daily.   Cholecalciferol (VITAMIN D) 2000 units CAPS Take by mouth daily.   DULoxetine  (CYMBALTA) 30 MG capsule Take 1 capsule by mouth twice daily   ezetimibe (ZETIA) 10 MG tablet Take 1 tablet by mouth once daily   lidocaine (LIDODERM) 5 % Place 1 patch onto the skin daily. Remove & Discard patch within 12 hours or as directed by MD   loratadine (CLARITIN) 10 MG tablet Take 1 tablet (10 mg total) by mouth daily as needed.   rosuvastatin (CRESTOR) 5 MG tablet TAKE 1 TABLET BY MOUTH EVERY OTHER DAY AT BEDTIME FOR  ATHEROSCLEROTIC  DISEASE   tiZANidine (ZANAFLEX) 4 MG tablet Take 1 tablet (4 mg total) by mouth every 6 (six) hours as needed for muscle spasms. (Patient not taking: Reported on 09/08/2022)   No facility-administered encounter medications on file as of 09/29/2022.    Allergies (verified) Atorvastatin, Codeine sulfate, Lyrica cr [pregabalin er], Pravastatin, Protonix [pantoprazole sodium], Tylenol with codeine #3  [acetaminophen-codeine], and Penicillin g   History: Past Medical History:  Diagnosis Date   Allergy    Baker's cyst of knee, right 06/04/2017   [name] Feb 2019   CKD (chronic kidney disease) stage 2, GFR 60-89 ml/min    Dyslipidemia    GERD (gastroesophageal reflux disease)    Hypertension    Lumbar spinal stenosis    Neuralgia neuritis, sciatic nerve 08/28/2012   Neuropathy    both feet   Obesity (BMI 30.0-34.9) 05/29/2016   Osteopenia 06/15/2016   March 2018  Personal history of tobacco use, presenting hazards to health 07/14/2015   Reflux    Sciatica    Thoracic aortic atherosclerosis (HCC) 08/01/2016   Chest CT   Venous stasis    Vitamin D deficiency disease    Wears dentures    Full upper, partial lower   Past Surgical History:  Procedure Laterality Date   ABDOMINAL HYSTERECTOMY     complete, due to heavy   CATARACT EXTRACTION, BILATERAL     COLONOSCOPY WITH PROPOFOL N/A 08/29/2019   Procedure: COLONOSCOPY WITH PROPOFOL;  Surgeon: Midge Minium, MD;  Location[facility];  Service: Endoscopy;  Laterality: N/A;  priority 4   ECTOPIC  PREGNANCY SURGERY     Family History  Problem Relation Age of Onset   Cancer Mother        colon   Hypertension Mother    Alzheimer's disease Mother    Cancer Father        prostate   Cancer Brother        lung   Stroke Brother    Cancer Brother        prostate and brain   Cancer Brother        throat   Diabetes Paternal Aunt    Breast cancer Paternal Aunt    Healthy Daughter    Cirrhosis Brother    Social History   Socioeconomic History   Marital status: Married    Spouse name: Not on file   Number of children: 1   Years of education: Not on file   Highest education level: 12th grade  Occupational History   Occupation: retired  Tobacco Use   Smoking status: Former    Packs/day: 1.00    Years: 30.00    Additional pack years: 0.00    Total pack years: 30.00    Types: Cigarettes    Quit date: 10/13/2005    Years since quitting: 16.9   Smokeless tobacco: Former    Types: Snuff    Quit date: 10/09/2011  Vaping Use   Vaping Use: Never used  Substance and Sexual Activity   Alcohol use: No    Alcohol/week: 0.0 standard drinks of alcohol   Drug use: Yes    Comment: rare   Sexual activity: Yes  Other Topics Concern   Not on file  Social History Narrative   Lives with husband in a one story home.  Has one daughter.     Retired from [employer] work.     Education: high school.   Social Determinants of Health   Financial Resource Strain: Low Risk  (09/13/2021)   Overall Financial Resource Strain (CARDIA)    Difficulty of Paying Living Expenses: Not very hard  Food Insecurity: No Food Insecurity (09/13/2021)   Hunger Vital Sign    Worried About Running Out of Food in the Last Year: Never true    Ran Out of Food in the Last Year: Never true  Transportation Needs: No Transportation Needs (09/13/2021)   PRAPARE - Administrator, Civil Service (Medical): No    Lack of Transportation (Non-Medical): No  Physical Activity: Inactive (09/13/2021)   Exercise Vital Sign     Days of Exercise per Week: 0 days    Minutes of Exercise per Session: 0 min  Stress: No Stress Concern Present (09/13/2021)   [employer] of Occupational Health - Occupational Stress Questionnaire    Feeling of Stress : Not at all  Social Connections: Moderately Integrated (09/13/2021)   Social  Connection and Isolation Panel [NHANES]    Frequency of Communication with Friends and Family: More than three times a week    Frequency of Social Gatherings with Friends and Family: Three times a week    Attends Religious Services: More than 4 times per year    Active Member of Clubs or Organizations: No    Attends Banker Meetings: Never    Marital Status: Married    Tobacco Counseling Counseling given: Not Answered   Clinical Intake:                        Activities of Daily Living    09/08/2022   11:11 AM 08/24/2022    1:06 PM  In your present state of health, do you have any difficulty performing the following activities:  Hearing? 1 1  Vision? 0 0  Difficulty concentrating or making decisions? 0 0  Walking or climbing stairs? 0 1  Dressing or bathing? 0 0  Doing errands, shopping? 0 0    Patient Care Team: Margarita Mail, DO as PCP - General (Internal Medicine)  Indicate any recent Medical Services you may have received from other than [facility] providers in the past year (date may be approximate).     Assessment:   This is a routine wellness examination for [place].  Hearing/Vision screen No results found.  Dietary issues and exercise activities discussed:     Goals Addressed   None    Depression Screen    09/08/2022   11:12 AM 08/24/2022    1:05 PM 04/27/2022    9:36 AM 03/29/2022   10:06 AM 11/17/2021   10:30 AM 09/13/2021    9:58 AM 09/06/2021   10:51 AM  PHQ 2/9 Scores  PHQ - 2 Score 0 0 0 0 0 0 0  PHQ- 9 Score 0 0 0 0 0  0    Fall Risk    09/08/2022   11:11 AM 08/24/2022    1:02 PM 04/27/2022    9:36 AM 03/29/2022    10:03 AM 11/17/2021   10:30 AM  Fall Risk   Falls in the past year? 0 0 0 0 0  Number falls in past yr: 0 0 0 0 0  Injury with Fall? 0 0 0 0 0  Risk for fall due to : No Fall Risks  No Fall Risks    Follow up Falls prevention discussed;Education provided;Falls evaluation completed  Falls prevention discussed;Education provided;Falls evaluation completed      MEDICARE RISK AT HOME:   TIMED UP AND GO:  Was the test performed?  No    Cognitive Function:        08/31/2020    9:40 AM 07/10/2019    9:03 AM 06/27/2018   11:06 AM 06/25/2017   11:38 AM 06/15/2016   11:41 AM  6CIT Screen  What Year? 0 points 0 points 0 points 0 points 0 points  What month? 0 points 0 points 0 points 0 points 0 points  What time? 0 points 0 points 0 points 0 points 0 points  Count back from 20 0 points 0 points 0 points 0 points 0 points  Months in reverse 0 points 2 points 0 points 2 points 0 points  Repeat phrase 0 points 2 points 2 points 0 points 2 points  Total Score 0 points 4 points 2 points 2 points 2 points    Immunizations Immunization History  Administered Date(s) Administered   [employer]  Quad(high Dose 65+) 12/03/2018, 01/12/2020, 02/09/2021   Influenza, High Dose Seasonal PF 01/19/2017   Influenza, Quadrivalent, Recombinant, Inj, Pf 01/24/2018   Influenza-Unspecified 03/18/2014, 01/05/2015, 01/03/2016, 12/29/2021   Moderna Sars-Covid-2 Vaccination 05/06/2019, 06/03/2019, 01/31/2020, 07/27/2020   PNEUMOCOCCAL CONJUGATE-20 12/31/2020, 05/20/2021   Pneumococcal Conjugate-13 11/18/2013   Pneumococcal Polysaccharide-23 05/15/2012, 01/10/2014   Respiratory Syncytial Virus Vaccine,Recomb Aduvanted(Arexvy) 12/29/2021   Td 01/10/2014   Tdap 11/22/2011   Zoster Recombinat (Shingrix) 10/15/2020, 12/31/2020   Zoster, Live 01/17/2012, 05/22/2014    TDAP status: Up to date  Flu Vaccine status: Up to date  Pneumococcal vaccine status: Up to date  Covid-19 vaccine status: Completed  vaccines  Qualifies for Shingles Vaccine? Yes   Zostavax completed Yes   Shingrix Completed?: Yes  Screening Tests Health Maintenance  Topic Date Due   COVID-19 Vaccine (5 - 2023-24 season) 12/09/2021   DEXA SCAN  01/25/2022   Medicare Annual Wellness (AWV)  09/14/2022   INFLUENZA VACCINE  11/09/2022   MAMMOGRAM  01/28/2023   DTaP/Tdap/Td (3 - Td or Tdap) 01/11/2024   Colonoscopy  08/28/2024   Pneumonia Vaccine 27+ Years old  Completed   Hepatitis C Screening  Completed   Zoster Vaccines- Shingrix  Completed   HPV VACCINES  Aged Out    Health Maintenance  Health Maintenance Due  Topic Date Due   COVID-19 Vaccine (5 - 2023-24 season) 12/09/2021   DEXA SCAN  01/25/2022   Medicare Annual Wellness (AWV)  09/14/2022    Colorectal cancer screening: No longer required.   Mammogram status: No longer required due to age.  Bone Density status: Completed yes. Results reflect: Bone density results: OSTEOPENIA. Repeat every 3 years.  Lung Cancer Screening: (Low Dose CT Chest recommended if Age 4-80 years, 20 pack-year currently smoking OR have quit w/in 15years.) does not qualify.   Lung Cancer Screening Referral: no  Additional Screening:  Hepatitis C Screening: does not qualify; Completed yes  Vision Screening: Recommended annual ophthalmology exams for early detection of glaucoma and other disorders of the eye. Is the patient up to date with their annual eye exam?  Yes  Who is the provider or what is the name of the office in which the patient attends annual eye exams? Dr Clydene Pugh If pt is not established with a provider, would they like to be referred to a provider to establish care? No .   Dental Screening: Recommended annual dental exams for proper oral hygiene  Diabetic Foot Exam:   Community Resource Referral / Chronic Care Management: CRR required this visit?  No   CCM required this visit?  No     Plan:     I have personally reviewed and noted the following  in the patient's chart:   Medical and social history Use of alcohol, tobacco or illicit drugs  Current medications and supplements including opioid prescriptions. Patient is not currently taking opioid prescriptions. Functional ability and status Nutritional status Physical activity Advanced directives List of other physicians Hospitalizations, surgeries, and ER visits in previous 12 months Vitals Screenings to include cognitive, depression, and falls Referrals and appointments  In addition, I have reviewed and discussed with patient certain preventive protocols, quality metrics, and best practice recommendations. A written personalized care plan for preventive services as well as general preventive health recommendations were provided to patient.     Sue Lush, LPN   12/07/5619   After Visit Summary: (MyChart) Due to this being a telephonic visit, the after visit summary with patients personalized plan  was offered to patient via MyChart   Nurse Notes: The patient states she is doing well and has no concerns or questions at this time.

## 2022-10-09 ENCOUNTER — Ambulatory Visit
Admission: RE | Admit: 2022-10-09 | Discharge: 2022-10-09 | Disposition: A | Discharge status: Home / Self Care | Payer: Medicare HMO | Source: Ambulatory Visit | Attending: Internal Medicine | Admitting: Internal Medicine

## 2022-10-09 DIAGNOSIS — Z780 Asymptomatic menopausal state: Secondary | ICD-10-CM | POA: Insufficient documentation

## 2022-10-09 DIAGNOSIS — M8589 Other specified disorders of bone density and structure, multiple sites: Secondary | ICD-10-CM | POA: Diagnosis not present

## 2022-10-24 NOTE — Progress Notes (Signed)
Established Patient Office Visit  Subjective:  Patient ID: Dana Peters, female    DOB: April 24, 1945  Age: 77 y.o. MRN: [id number]  CC:  Chief Complaint  Patient presents with   Follow-up    HPI ALFREDO VOLLRATH presents for follow up on chronic medical conditions.   Neuropathy: -Symptoms on bilateral bottoms of feet, chronic. Improved.    Treatments attempted: Had been on Gabapentin before but was discontinued due to an issue with her kidneys -Labs from 03/29/22 normal.  -She is currently on Cymbalta 30 mg BID -Patient states the Lyrica cause blurred vision and swelling in her legs so she discontinued it.    HLD/PAD/CAD: -Medications: Crestor 5 mg, Zetia 10 mg, aspirin 81 mg -Patient is compliant with above medications and reports no side effects.  -Last lipid panel: Lipid Panel     Component Value Date/Time   CHOL 111 04/27/2022 1005   CHOL 177 05/07/2015 1131   TRIG 57 04/27/2022 1005   HDL 68 04/27/2022 1005   HDL 47 05/07/2015 1131   CHOLHDL 1.6 04/27/2022 1005   VLDL 39 (H) 10/26/2016 1123   LDLCALC 30 04/27/2022 1005   LABVLDL 40 05/07/2015 1131   Environmental Allergies: -Currently Claritin 10 mg, which controls symptoms well.   GERD: -Not currently on medication, diet controlled by avoiding triggering foods.  Vitamin D Deficiency: -Currently on 2000 IU daily -Last vitamin D level 48 12/23  Health Maintenance: -Blood work UTD -Mammogram 10/23 Birads-1 -DEXA 7/24 with osteopenia  -Colonoscopy 5/21 negative -Lung cancer screening: 9/23 Lung-RADS 2   Past Medical History:  Diagnosis Date   Allergy    Baker's cyst of knee, right 06/04/2017   [name] Feb 2019   CKD (chronic kidney disease) stage 2, GFR 60-89 ml/min    Dyslipidemia    GERD (gastroesophageal reflux disease)    Hypertension    Lumbar spinal stenosis    Neuralgia neuritis, sciatic nerve 08/28/2012   Neuropathy    both feet   Obesity (BMI 30.0-34.9) 05/29/2016   Osteopenia 06/15/2016    March 2018   Personal history of tobacco use, presenting hazards to health 07/14/2015   Reflux    Sciatica    Thoracic aortic atherosclerosis (HCC) 08/01/2016   Chest CT   Venous stasis    Vitamin D deficiency disease    Wears dentures    Full upper, partial lower    Past Surgical History:  Procedure Laterality Date   ABDOMINAL HYSTERECTOMY     complete, due to heavy   CATARACT EXTRACTION, BILATERAL     COLONOSCOPY WITH PROPOFOL N/A 08/29/2019   Procedure: COLONOSCOPY WITH PROPOFOL;  Surgeon: Midge Minium, MD;  [facility];  Service: Endoscopy;  Laterality: N/A;  priority 4   ECTOPIC PREGNANCY SURGERY      Family History  Problem Relation Age of Onset   Cancer Mother        colon   Hypertension Mother    Alzheimer's disease Mother    Cancer Father        prostate   Cancer Brother        lung   Stroke Brother    Cancer Brother        prostate and brain   Cancer Brother        throat   Diabetes Paternal Aunt    Breast cancer Paternal Aunt    Healthy Daughter    Cirrhosis Brother     Social History   Socioeconomic History  Marital status: Married    Spouse name: Not on file   Number of children: 1   Years of education: Not on file   Highest education level: 12th grade  Occupational History   Occupation: retired  Tobacco Use   Smoking status: Former    Current packs/day: 0.00    Average packs/day: 1 pack/day for 30.0 years (30.0 ttl pk-yrs)    Types: Cigarettes    Start date: 10/14/1975    Quit date: 10/13/2005    Years since quitting: 17.0   Smokeless tobacco: Former    Types: Snuff    Quit date: 10/09/2011  Vaping Use   Vaping status: Never Used  Substance and Sexual Activity   Alcohol use: No    Alcohol/week: 0.0 standard drinks of alcohol   Drug use: Yes    Comment: rare   Sexual activity: Yes  Other Topics Concern   Not on file  Social History Narrative   Lives with husband in a one story home.  Has one daughter.     Retired  from [employer] work.     Education: high school.   Social Determinants of Health   Financial Resource Strain: Low Risk  (09/29/2022)   Overall Financial Resource Strain (CARDIA)    Difficulty of Paying Living Expenses: Not very hard  Food Insecurity: No Food Insecurity (09/29/2022)   Hunger Vital Sign    Worried About Running Out of Food in the Last Year: Never true    Ran Out of Food in the Last Year: Never true  Transportation Needs: No Transportation Needs (09/29/2022)   PRAPARE - Administrator, Civil Service (Medical): No    Lack of Transportation (Non-Medical): No  Physical Activity: Insufficiently Active (09/29/2022)   Exercise Vital Sign    Days of Exercise per Week: 3 days    Minutes of Exercise per Session: 30 min  Stress: No Stress Concern Present (09/29/2022)   [employer] of Occupational Health - Occupational Stress Questionnaire    Feeling of Stress : Not at all  Social Connections: Moderately Integrated (09/29/2022)   Social Connection and Isolation Panel [NHANES]    Frequency of Communication with Friends and Family: More than three times a week    Frequency of Social Gatherings with Friends and Family: Three times a week    Attends Religious Services: More than 4 times per year    Active Member of Clubs or Organizations: No    Attends Banker Meetings: Never    Marital Status: Married  Catering manager Violence: Not At Risk (09/29/2022)   Humiliation, Afraid, Rape, and Kick questionnaire    Fear of Current or Ex-Partner: No    Emotionally Abused: No    Physically Abused: No    Sexually Abused: No    Outpatient Medications Prior to Visit  Medication Sig Dispense Refill   aspirin EC 81 MG tablet Take 1 tablet (81 mg total) by mouth daily. 30 tablet 11   Cholecalciferol (VITAMIN D) 2000 units CAPS Take by mouth daily.     lidocaine (LIDODERM) 5 % Place 1 patch onto the skin daily. Remove & Discard patch within 12 hours or as directed by MD  30 patch 0   loratadine (CLARITIN) 10 MG tablet Take 1 tablet (10 mg total) by mouth daily as needed. 90 tablet 3   tiZANidine (ZANAFLEX) 4 MG tablet Take 1 tablet (4 mg total) by mouth every 6 (six) hours as needed for muscle spasms. 30 tablet 0  DULoxetine (CYMBALTA) 30 MG capsule Take 1 capsule by mouth twice daily 180 capsule 0   ezetimibe (ZETIA) 10 MG tablet Take 1 tablet by mouth once daily 90 tablet 0   rosuvastatin (CRESTOR) 5 MG tablet TAKE 1 TABLET BY MOUTH EVERY OTHER DAY AT BEDTIME FOR  ATHEROSCLEROTIC  DISEASE 90 tablet 1   No facility-administered medications prior to visit.    Allergies  Allergen Reactions   Atorvastatin Itching   Codeine Sulfate Itching   Lyrica Cr [Pregabalin Er]     Blurred vision and swelling in legs   Pravastatin Nausea Only   Protonix [Pantoprazole Sodium] Other (See Comments)    Legs felt heavy   Tylenol With Codeine #3  [Acetaminophen-Codeine] Itching   Penicillin G Rash    ROS Review of Systems  Constitutional:  Negative for chills and fever.  Eyes:  Negative for visual disturbance.  Respiratory:  Negative for cough and shortness of breath.   Cardiovascular:  Negative for chest pain.  Gastrointestinal:  Negative for abdominal pain.  Neurological:  Negative for dizziness and headaches.      Objective:    Physical Exam Constitutional:      Appearance: Normal appearance.  HENT:     Head: Normocephalic and atraumatic.  Eyes:     Conjunctiva/sclera: Conjunctivae normal.  Cardiovascular:     Rate and Rhythm: Normal rate and regular rhythm.  Pulmonary:     Effort: Pulmonary effort is normal.     Breath sounds: Normal breath sounds.  Musculoskeletal:     Right lower leg: No edema.     Left lower leg: No edema.  Skin:    General: Skin is warm and dry.  Neurological:     General: No focal deficit present.     Mental Status: She is alert. Mental status is at baseline.  Psychiatric:        Mood and Affect: Mood normal.         Behavior: Behavior normal.     BP 132/78   Pulse 76   Temp 98 F (36.7 C)   Resp 18   Ht 5\' 8"  (1.727 m)   Wt 203 lb 12.8 oz (92.4 kg)   LMP  (LMP Unknown)   SpO2 93%   BMI 30.99 kg/m  Wt Readings from Last 3 Encounters:  10/26/22 203 lb 12.8 oz (92.4 kg)  09/29/22 203 lb (92.1 kg)  09/08/22 203 lb 3.2 oz (92.2 kg)     There are no preventive care reminders to display for this patient.   There are no preventive care reminders to display for this patient.  Lab Results  Component Value Date   TSH 2.59 03/29/2022   Lab Results  Component Value Date   WBC 11.2 (H) 04/27/2022   HGB 12.9 04/27/2022   HCT 37.7 04/27/2022   MCV 96.7 04/27/2022   PLT 179 04/27/2022   Lab Results  Component Value Date   NA 142 09/08/2022   K 4.6 09/08/2022   CO2 28 09/08/2022   GLUCOSE 86 09/08/2022   BUN 20 09/08/2022   CREATININE 0.83 09/08/2022   BILITOT 0.5 09/06/2021   ALKPHOS 80 05/28/2017   AST 18 09/06/2021   ALT 17 09/06/2021   PROT 6.8 09/06/2021   ALBUMIN 4.1 05/28/2017   CALCIUM 9.7 09/08/2022   ANIONGAP 6 05/28/2017   EGFR 73 09/08/2022   Lab Results  Component Value Date   CHOL 111 04/27/2022   Lab Results  Component Value Date  HDL 68 04/27/2022   Lab Results  Component Value Date   LDLCALC 30 04/27/2022   Lab Results  Component Value Date   TRIG 57 04/27/2022   Lab Results  Component Value Date   CHOLHDL 1.6 04/27/2022   Lab Results  Component Value Date   HGBA1C 5.5 03/29/2022      Assessment & Plan:   1. Neuropathy of both feet: Stable, doing well on Cymbalta 30 mg BID, refilled.  - DULoxetine (CYMBALTA) 30 MG capsule; Take 1 capsule (30 mg total) by mouth 2 (two) times daily.  Dispense: 180 capsule; Refill: 0  2. Dyslipidemia/PAD (peripheral artery disease) (HCC)/Coronary artery calcification seen on CT scan: Stable, refill Crestor 5 mg and Zetia 10 mg.  - ezetimibe (ZETIA) 10 MG tablet; Take 1 tablet (10 mg total) by mouth daily.   Dispense: 90 tablet; Refill: 0 - rosuvastatin (CRESTOR) 5 MG tablet; TAKE 1 TABLET BY MOUTH EVERY OTHER DAY AT BEDTIME FOR  ATHEROSCLEROTIC  DISEASE  Dispense: 90 tablet; Refill: 1  3. Osteopenia of neck of right femur: Reviewed bone scan with the patient, stable osteopenia. Continue Vitamin D supplements, discussed weight bearing exercises to help prevent further bone loss.    Follow-up: Return in about 6 months (around 04/28/2023).    Margarita Mail, DO

## 2022-10-26 ENCOUNTER — Encounter: Payer: Self-pay | Admitting: Internal Medicine

## 2022-10-26 ENCOUNTER — Ambulatory Visit (INDEPENDENT_AMBULATORY_CARE_PROVIDER_SITE_OTHER): Payer: Medicare HMO | Admitting: Internal Medicine

## 2022-10-26 VITALS — BP 132/78 | HR 76 | Temp 98.0°F | Resp 18 | Ht 68.0 in | Wt 203.8 lb

## 2022-10-26 DIAGNOSIS — E785 Hyperlipidemia, unspecified: Secondary | ICD-10-CM | POA: Diagnosis not present

## 2022-10-26 DIAGNOSIS — I2510 Atherosclerotic heart disease of native coronary artery without angina pectoris: Secondary | ICD-10-CM

## 2022-10-26 DIAGNOSIS — M85851 Other specified disorders of bone density and structure, right thigh: Secondary | ICD-10-CM

## 2022-10-26 DIAGNOSIS — I739 Peripheral vascular disease, unspecified: Secondary | ICD-10-CM

## 2022-10-26 DIAGNOSIS — G5793 Unspecified mononeuropathy of bilateral lower limbs: Secondary | ICD-10-CM

## 2022-10-26 MED ORDER — ROSUVASTATIN CALCIUM 5 MG PO TABS: ORAL_TABLET | ORAL | 1 refills | Status: DC

## 2022-10-26 MED ORDER — DULOXETINE HCL 30 MG PO CPEP: 30.0000 mg | ORAL_CAPSULE | Freq: Two times a day (BID) | ORAL | 0 refills | Status: DC

## 2022-10-26 MED ORDER — EZETIMIBE 10 MG PO TABS: 10.0000 mg | ORAL_TABLET | Freq: Every day | ORAL | 0 refills | Status: DC

## 2022-11-01 DIAGNOSIS — H26493 Other secondary cataract, bilateral: Secondary | ICD-10-CM | POA: Diagnosis not present

## 2022-11-01 DIAGNOSIS — H1045 Other chronic allergic conjunctivitis: Secondary | ICD-10-CM | POA: Diagnosis not present

## 2022-11-01 DIAGNOSIS — Z961 Presence of intraocular lens: Secondary | ICD-10-CM | POA: Diagnosis not present

## 2022-12-15 ENCOUNTER — Other Ambulatory Visit: Payer: Self-pay | Admitting: Internal Medicine

## 2022-12-15 DIAGNOSIS — G629 Polyneuropathy, unspecified: Secondary | ICD-10-CM | POA: Diagnosis not present

## 2022-12-15 DIAGNOSIS — I129 Hypertensive chronic kidney disease with stage 1 through stage 4 chronic kidney disease, or unspecified chronic kidney disease: Secondary | ICD-10-CM | POA: Diagnosis not present

## 2022-12-15 DIAGNOSIS — Z8249 Family history of ischemic heart disease and other diseases of the circulatory system: Secondary | ICD-10-CM | POA: Diagnosis not present

## 2022-12-15 DIAGNOSIS — Z809 Family history of malignant neoplasm, unspecified: Secondary | ICD-10-CM | POA: Diagnosis not present

## 2022-12-15 DIAGNOSIS — N189 Chronic kidney disease, unspecified: Secondary | ICD-10-CM | POA: Diagnosis not present

## 2022-12-15 DIAGNOSIS — E669 Obesity, unspecified: Secondary | ICD-10-CM | POA: Diagnosis not present

## 2022-12-15 DIAGNOSIS — Z87891 Personal history of nicotine dependence: Secondary | ICD-10-CM | POA: Diagnosis not present

## 2022-12-15 DIAGNOSIS — K219 Gastro-esophageal reflux disease without esophagitis: Secondary | ICD-10-CM | POA: Diagnosis not present

## 2022-12-15 DIAGNOSIS — J301 Allergic rhinitis due to pollen: Secondary | ICD-10-CM | POA: Diagnosis not present

## 2022-12-15 DIAGNOSIS — I739 Peripheral vascular disease, unspecified: Secondary | ICD-10-CM | POA: Diagnosis not present

## 2022-12-15 DIAGNOSIS — R32 Unspecified urinary incontinence: Secondary | ICD-10-CM | POA: Diagnosis not present

## 2022-12-15 DIAGNOSIS — Z1231 Encounter for screening mammogram for malignant neoplasm of breast: Secondary | ICD-10-CM

## 2022-12-15 DIAGNOSIS — E785 Hyperlipidemia, unspecified: Secondary | ICD-10-CM | POA: Diagnosis not present

## 2022-12-26 ENCOUNTER — Telehealth: Payer: Self-pay | Admitting: Internal Medicine

## 2022-12-26 NOTE — Telephone Encounter (Signed)
Copied from CRM [id number]. Topic: General - Other >> Dec 26, 2022  9:15 AM Everette C wrote: Reason for CRM: The patient shares that they have recently been seen for a home visit on 12/19/22  The patient has had their breathing tested and would like to know if their PCP needs to see them for a follow up or if they are okay to proceed as usual once the results of their testing have been reviewed by clinical staff  Please contact further when possible

## 2022-12-26 NOTE — Telephone Encounter (Signed)
Patient notified usually Dr. Caralee Ates reviews and if there is a problem we will call to notify patient to schedule visit.  Nothing scanned in yet from her home visit.

## 2023-01-29 ENCOUNTER — Ambulatory Visit
Admission: RE | Admit: 2023-01-29 | Discharge: 2023-01-29 | Disposition: A | Discharge status: Home / Self Care | Payer: Medicare HMO | Source: Ambulatory Visit | Attending: Internal Medicine | Admitting: Internal Medicine

## 2023-01-29 DIAGNOSIS — Z1231 Encounter for screening mammogram for malignant neoplasm of breast: Secondary | ICD-10-CM | POA: Insufficient documentation

## 2023-03-28 ENCOUNTER — Other Ambulatory Visit: Payer: Self-pay | Admitting: Internal Medicine

## 2023-03-28 DIAGNOSIS — G5793 Unspecified mononeuropathy of bilateral lower limbs: Secondary | ICD-10-CM

## 2023-03-28 NOTE — Telephone Encounter (Signed)
Copied from CRM [id number]. Topic: General - Other >> Mar 28, 2023  1:20 PM Dominique E wrote: Reason for CRM: Pt called to report that she has two pills left of duloxetine, advised that PCP has received refill request.

## 2023-03-28 NOTE — Telephone Encounter (Signed)
Requested Prescriptions  Pending Prescriptions Disp Refills   DULoxetine (CYMBALTA) 30 MG capsule [Pharmacy Med Name: DULoxetine HCl 30 MG Oral Capsule Delayed Release Particles] 180 capsule 0    Sig: Take 1 capsule by mouth twice daily     Psychiatry: Antidepressants - SNRI - duloxetine Passed - 03/28/2023  1:48 PM      Passed - Cr in normal range and within 360 days    Creat  Date Value Ref Range Status  09/08/2022 0.83 0.60 - 1.00 mg/dL Final   Creatinine, Urine  Date Value Ref Range Status  06/04/2017 121 20 - 275 mg/dL Final         Passed - eGFR is 30 or above and within 360 days    GFR, Est African American  Date Value Ref Range Status  05/17/2020 69 > OR = 60 mL/min/1.34m2 Final   GFR, Est Non African American  Date Value Ref Range Status  05/17/2020 59 (L) > OR = 60 mL/min/1.37m2 Final   eGFR  Date Value Ref Range Status  09/08/2022 73 > OR = 60 mL/min/1.54m2 Final         Passed - Completed PHQ-2 or PHQ-9 in the last 360 days      Passed - Last BP in normal range    BP Readings from Last 1 Encounters:  10/26/22 132/78         Passed - Valid encounter within last 6 months    Recent Outpatient Visits           5 months ago Neuropathy of both feet   [facility] Margarita Mail, DO   6 months ago Dark urine   [facility] Danelle Berry, PA-C   7 months ago Chronic bilateral low back pain without sciatica   [facility] Margarita Mail, DO   11 months ago Dyslipidemia   [facility] Margarita Mail, DO   12 months ago Neuropathy of both feet   [facility] Margarita Mail, DO       Future Appointments             In 1 month Margarita Mail, DO [facility]

## 2023-04-30 ENCOUNTER — Ambulatory Visit: Payer: Medicare HMO | Admitting: Internal Medicine

## 2023-05-01 ENCOUNTER — Ambulatory Visit (INDEPENDENT_AMBULATORY_CARE_PROVIDER_SITE_OTHER): Payer: Medicare HMO | Admitting: Internal Medicine

## 2023-05-01 ENCOUNTER — Other Ambulatory Visit: Payer: Self-pay

## 2023-05-01 ENCOUNTER — Encounter: Payer: Self-pay | Admitting: Internal Medicine

## 2023-05-01 VITALS — BP 136/72 | HR 97 | Temp 98.6°F | Resp 16 | Ht 68.0 in | Wt 212.2 lb

## 2023-05-01 DIAGNOSIS — I2510 Atherosclerotic heart disease of native coronary artery without angina pectoris: Secondary | ICD-10-CM

## 2023-05-01 DIAGNOSIS — G5793 Unspecified mononeuropathy of bilateral lower limbs: Secondary | ICD-10-CM

## 2023-05-01 DIAGNOSIS — E785 Hyperlipidemia, unspecified: Secondary | ICD-10-CM

## 2023-05-01 DIAGNOSIS — J301 Allergic rhinitis due to pollen: Secondary | ICD-10-CM

## 2023-05-01 DIAGNOSIS — R739 Hyperglycemia, unspecified: Secondary | ICD-10-CM | POA: Diagnosis not present

## 2023-05-01 DIAGNOSIS — M25561 Pain in right knee: Secondary | ICD-10-CM

## 2023-05-01 DIAGNOSIS — G8929 Other chronic pain: Secondary | ICD-10-CM | POA: Diagnosis not present

## 2023-05-01 DIAGNOSIS — I739 Peripheral vascular disease, unspecified: Secondary | ICD-10-CM | POA: Diagnosis not present

## 2023-05-01 DIAGNOSIS — M25562 Pain in left knee: Secondary | ICD-10-CM | POA: Diagnosis not present

## 2023-05-01 MED ORDER — DULOXETINE HCL 60 MG PO CPEP: 60.0000 mg | ORAL_CAPSULE | Freq: Every day | ORAL | 1 refills | Status: DC

## 2023-05-01 MED ORDER — ROSUVASTATIN CALCIUM 5 MG PO TABS: ORAL_TABLET | ORAL | 1 refills | Status: DC

## 2023-05-01 MED ORDER — EZETIMIBE 10 MG PO TABS: 10.0000 mg | ORAL_TABLET | Freq: Every day | ORAL | 0 refills | Status: DC

## 2023-05-01 MED ORDER — LORATADINE 10 MG PO TABS: 10.0000 mg | ORAL_TABLET | Freq: Every day | ORAL | 3 refills | Status: AC | PRN

## 2023-05-01 NOTE — Progress Notes (Signed)
Established Patient Office Visit  Subjective   Patient ID: Dana Peters, female    DOB: 08-05-1945  Age: 78 y.o. MRN: [id number]  Chief Complaint  Patient presents with   Medical Management of Chronic Issues    6 month recheck    HPI  Dana Peters presents for follow up on chronic medical conditions. She is having some bilateral chronic knee pain posteriorly. She taking Tylenol which does help.   Neuropathy: -Symptoms on bilateral bottoms of feet, chronic. Improved.    Treatments attempted: Had been on Gabapentin before but was discontinued due to an issue with her kidneys  -She is currently on Cymbalta 30 mg BID and symptoms are well controlled -Patient states the Lyrica cause blurred vision and swelling in her legs so she discontinued it.    HLD/PAD/CAD: -Medications: Crestor 5 mg, Zetia 10 mg, aspirin 81 mg -Patient is compliant with above medications and reports no side effects.  -Last lipid panel: Lipid Panel     Component Value Date/Time   CHOL 111 04/27/2022 1005   CHOL 177 05/07/2015 1131   TRIG 57 04/27/2022 1005   HDL 68 04/27/2022 1005   HDL 47 05/07/2015 1131   CHOLHDL 1.6 04/27/2022 1005   VLDL 39 (H) 10/26/2016 1123   LDLCALC 30 04/27/2022 1005   LABVLDL 40 05/07/2015 1131    Environmental Allergies: -Currently Claritin 10 mg, which controls symptoms well.   GERD: -Not currently on medication, diet controlled by avoiding triggering foods. -She has been having some gas pain, taking over the counter Gas-X  Vitamin D Deficiency: -Currently on 2000 IU daily -Last vitamin D level 48 12/23  Health Maintenance: -Blood work due -Mammogram 10/24 Birads-1 -DEXA 7/24 with osteopenia  -Colonoscopy 5/21 negative -Lung cancer screening: 9/23 Lung-RADS 2 - ordered for this year  Patient Active Problem List   Diagnosis Date Noted   Elevated blood pressure reading 11/17/2021   Class 2 severe obesity with serious comorbidity and body mass index  (BMI) of 35.0 to 35.9 in adult, unspecified obesity type (HCC) 05/20/2021   Lymphedema 02/26/2020   History of colonic polyps    Bilateral lower extremity edema 09/27/2017   Emphysema of lung (HCC) 08/14/2017   Ganglion cyst of finger 06/25/2017   Full code status 06/25/2017   Osteoarthritis of knee 06/12/2017   Baker's cyst of knee, right 06/04/2017   Thoracic aortic atherosclerosis (HCC) 08/01/2016   Osteopenia 06/15/2016   Obesity (BMI 30.0-34.9) 05/29/2016   Urinary hesitancy 02/14/2016   PAD (peripheral artery disease) (HCC) 09/09/2015   Coronary artery calcification seen on CT scan 07/20/2015   Personal history of tobacco use, presenting hazards to health 07/14/2015   Foraminal stenosis of lumbar region 01/28/2015   Facet arthropathy, lumbar 01/28/2015   Spinal stenosis of lumbar region 01/28/2015   Neuropathy of both feet 01/07/2015   Leg pain, bilateral 01/07/2015   Leg heaviness 01/07/2015   CKD (chronic kidney disease) stage 2, GFR 60-89 ml/min 10/17/2014   Allergic rhinitis 10/14/2014   Dyslipidemia 10/14/2014   Hypertension    Vitamin D deficiency disease    GERD (gastroesophageal reflux disease) 02/02/2014   Past Medical History:  Diagnosis Date   Allergy    Baker's cyst of knee, right 06/04/2017   [name] Feb 2019   CKD (chronic kidney disease) stage 2, GFR 60-89 ml/min    Dyslipidemia    GERD (gastroesophageal reflux disease)    Hypertension    Lumbar spinal stenosis    Neuralgia neuritis,  sciatic nerve 08/28/2012   Neuropathy    both feet   Obesity (BMI 30.0-34.9) 05/29/2016   Osteopenia 06/15/2016   March 2018   Personal history of tobacco use, presenting hazards to health 07/14/2015   Reflux    Sciatica    Thoracic aortic atherosclerosis (HCC) 08/01/2016   Chest CT   Venous stasis    Vitamin D deficiency disease    Wears dentures    Full upper, partial lower   Past Surgical History:  Procedure Laterality Date   ABDOMINAL HYSTERECTOMY     complete, due to  heavy   CATARACT EXTRACTION, BILATERAL     COLONOSCOPY WITH PROPOFOL N/A 08/29/2019   Procedure: COLONOSCOPY WITH PROPOFOL;  Surgeon: Midge Minium, MD;  Location: [facility];  Service: Endoscopy;  Laterality: N/A;  priority 4   ECTOPIC PREGNANCY SURGERY     Social History   Tobacco Use   Smoking status: Former    Current packs/day: 0.00    Average packs/day: 1 pack/day for 30.0 years (30.0 ttl pk-yrs)    Types: Cigarettes    Start date: 10/14/1975    Quit date: 10/13/2005    Years since quitting: 17.5   Smokeless tobacco: Former    Types: Snuff    Quit date: 10/09/2011  Vaping Use   Vaping status: Never Used  Substance Use Topics   Alcohol use: No    Alcohol/week: 0.0 standard drinks of alcohol   Drug use: Yes    Comment: rare   Social History   Socioeconomic History   Marital status: Married    Spouse name: Not on file   Number of children: 1   Years of education: Not on file   Highest education level: 12th grade  Occupational History   Occupation: retired  Tobacco Use   Smoking status: Former    Current packs/day: 0.00    Average packs/day: 1 pack/day for 30.0 years (30.0 ttl pk-yrs)    Types: Cigarettes    Start date: 10/14/1975    Quit date: 10/13/2005    Years since quitting: 17.5   Smokeless tobacco: Former    Types: Snuff    Quit date: 10/09/2011  Vaping Use   Vaping status: Never Used  Substance and Sexual Activity   Alcohol use: No    Alcohol/week: 0.0 standard drinks of alcohol   Drug use: Yes    Comment: rare   Sexual activity: Yes  Other Topics Concern   Not on file  Social History Narrative   Lives with husband in a one story home.  Has one daughter.     Retired from [employer] work.     Education: high school.   Social Drivers of Corporate investment banker Strain: Low Risk  (09/29/2022)   Overall Financial Resource Strain (CARDIA)    Difficulty of Paying Living Expenses: Not very hard  Food Insecurity: No Food Insecurity (09/29/2022)    Hunger Vital Sign    Worried About Running Out of Food in the Last Year: Never true    Ran Out of Food in the Last Year: Never true  Transportation Needs: No Transportation Needs (09/29/2022)   PRAPARE - Administrator, Civil Service (Medical): No    Lack of Transportation (Non-Medical): No  Physical Activity: Insufficiently Active (09/29/2022)   Exercise Vital Sign    Days of Exercise per Week: 3 days    Minutes of Exercise per Session: 30 min  Stress: No Stress Concern Present (09/29/2022)   [place]  Institute of Occupational Health - Occupational Stress Questionnaire    Feeling of Stress : Not at all  Social Connections: Moderately Integrated (09/29/2022)   Social Connection and Isolation Panel [NHANES]    Frequency of Communication with Friends and Family: More than three times a week    Frequency of Social Gatherings with Friends and Family: Three times a week    Attends Religious Services: More than 4 times per year    Active Member of Clubs or Organizations: No    Attends Banker Meetings: Never    Marital Status: Married  Catering manager Violence: Not At Risk (09/29/2022)   Humiliation, Afraid, Rape, and Kick questionnaire    Fear of Current or Ex-Partner: No    Emotionally Abused: No    Physically Abused: No    Sexually Abused: No   Family Status  Relation Name Status   Mother  Deceased       alzheimers   Father  Deceased       prostate cancer    Brother youngest Deceased       throat cancer   Brother middle Alive   Brother oldest Alive   Oceanographer  (Not Specified)   Daughter  (Not Specified)   Brother  Deceased       cirrhosis of liver   MGM  Deceased       old age    MGF  Deceased   PGM  Deceased       natural complications   PGF  Deceased       old age   No partnership data on file   Family History  Problem Relation Age of Onset   Cancer Mother        colon   Hypertension Mother    Alzheimer's disease Mother    Cancer Father         prostate   Cancer Brother        lung   Stroke Brother    Cancer Brother        prostate and brain   Cancer Brother        throat   Diabetes Paternal Aunt    Breast cancer Paternal Aunt    Healthy Daughter    Cirrhosis Brother    Allergies  Allergen Reactions   Atorvastatin Itching   Codeine Sulfate Itching   Lyrica Cr [Pregabalin Er]     Blurred vision and swelling in legs   Pravastatin Nausea Only   Protonix [Pantoprazole Sodium] Other (See Comments)    Legs felt heavy   Tylenol With Codeine #3  [Acetaminophen-Codeine] Itching   Penicillin G Rash      Review of Systems  Musculoskeletal:  Positive for joint pain.  All other systems reviewed and are negative.     Objective:     BP 136/72 (Cuff Size: Large)   Pulse 97   Temp 98.6 F (37 C) (Oral)   Resp 16   Ht 5\' 8"  (1.727 m)   Wt 212 lb 3.2 oz (96.3 kg)   LMP  (LMP Unknown)   SpO2 98%   BMI 32.26 kg/m  BP Readings from Last 3 Encounters:  05/01/23 136/72  10/26/22 132/78  09/08/22 134/82   Wt Readings from Last 3 Encounters:  05/01/23 212 lb 3.2 oz (96.3 kg)  10/26/22 203 lb 12.8 oz (92.4 kg)  09/29/22 203 lb (92.1 kg)      Physical Exam Constitutional:      Appearance: Normal  appearance.  HENT:     Head: Normocephalic and atraumatic.  Eyes:     Conjunctiva/sclera: Conjunctivae normal.  Cardiovascular:     Rate and Rhythm: Normal rate and regular rhythm.  Pulmonary:     Effort: Pulmonary effort is normal.     Breath sounds: Normal breath sounds.  Musculoskeletal:     Right knee: Normal. No swelling or bony tenderness. No tenderness.     Left knee: Normal. No swelling or bony tenderness. No tenderness.     Right lower leg: No edema.     Left lower leg: No edema.     Comments: No Baker's cysts noted bilaterally   Skin:    General: Skin is warm and dry.  Neurological:     General: No focal deficit present.     Mental Status: She is alert. Mental status is at baseline.  Psychiatric:         Mood and Affect: Mood normal.        Behavior: Behavior normal.      No results found for any visits on 05/01/23.  Last CBC Lab Results  Component Value Date   WBC 11.2 (H) 04/27/2022   HGB 12.9 04/27/2022   HCT 37.7 04/27/2022   MCV 96.7 04/27/2022   MCH 33.1 (H) 04/27/2022   RDW 13.1 04/27/2022   PLT 179 04/27/2022   Last metabolic panel Lab Results  Component Value Date   GLUCOSE 86 09/08/2022   NA 142 09/08/2022   K 4.6 09/08/2022   CL 104 09/08/2022   CO2 28 09/08/2022   BUN 20 09/08/2022   CREATININE 0.83 09/08/2022   EGFR 73 09/08/2022   CALCIUM 9.7 09/08/2022   PROT 6.8 09/06/2021   ALBUMIN 4.1 05/28/2017   LABGLOB 2.6 05/07/2015   AGRATIO 1.5 05/07/2015   BILITOT 0.5 09/06/2021   ALKPHOS 80 05/28/2017   AST 18 09/06/2021   ALT 17 09/06/2021   ANIONGAP 6 05/28/2017   Last lipids Lab Results  Component Value Date   CHOL 111 04/27/2022   HDL 68 04/27/2022   LDLCALC 30 04/27/2022   TRIG 57 04/27/2022   CHOLHDL 1.6 04/27/2022   Last hemoglobin A1c Lab Results  Component Value Date   HGBA1C 5.5 03/29/2022   Last thyroid functions Lab Results  Component Value Date   TSH 2.59 03/29/2022   Last vitamin D Lab Results  Component Value Date   VD25OH 48 03/29/2022   Last vitamin B12 and Folate Lab Results  Component Value Date   VITAMINB12 406 03/29/2022   FOLATE 17.9 03/29/2022      The ASCVD Risk score (Arnett DK, et al., 2019) failed to calculate for the following reasons:   The valid total cholesterol range is 130 to 320 mg/dL    Assessment & Plan:   Neuropathy of both feet Assessment & Plan: Stable, change Cymbalta to 60 mg once daily.   Orders: -     DULoxetine HCl; Take 1 capsule (60 mg total) by mouth daily.  Dispense: 90 capsule; Refill: 1  Dyslipidemia Assessment & Plan: Recheck labs, doing well on statin, zetia and aspirin.   Orders: -     Lipid panel -     Ezetimibe; Take 1 tablet (10 mg total) by mouth daily.   Dispense: 90 tablet; Refill: 0 -     Rosuvastatin Calcium; TAKE 1 TABLET BY MOUTH EVERY OTHER DAY AT BEDTIME FOR  ATHEROSCLEROTIC  DISEASE  Dispense: 90 tablet; Refill: 1  Coronary artery calcification seen  on CT scan Assessment & Plan: Recheck labs today, continue statin, Zetia and aspirin.   Orders: -     CBC with Differential/Platelet -     COMPLETE METABOLIC PANEL WITH GFR -     Lipid panel -     Rosuvastatin Calcium; TAKE 1 TABLET BY MOUTH EVERY OTHER DAY AT BEDTIME FOR  ATHEROSCLEROTIC  DISEASE  Dispense: 90 tablet; Refill: 1  Seasonal allergic rhinitis due to pollen Assessment & Plan: Stable, refill Claritin.   Orders: -     Loratadine; Take 1 tablet (10 mg total) by mouth daily as needed.  Dispense: 90 tablet; Refill: 3  Chronic pain of both knees  No Baker's cysts or abnormalities on exam, consistent with chronic OA. Recommend continuing treatment with Tylenol and Aleve PRN. Will let me know if symptoms worsen and we can get an x-ray.    Return in about 6 months (around 10/29/2023).    Margarita Mail, DO

## 2023-05-01 NOTE — Assessment & Plan Note (Signed)
Stable, change Cymbalta to 60 mg once daily.

## 2023-05-01 NOTE — Assessment & Plan Note (Signed)
Recheck labs today, continue statin, Zetia and aspirin.

## 2023-05-01 NOTE — Assessment & Plan Note (Signed)
Recheck labs, doing well on statin, zetia and aspirin.

## 2023-05-01 NOTE — Assessment & Plan Note (Signed)
Stable, refill Claritin.

## 2023-05-02 LAB — COMPLETE METABOLIC PANEL WITHOUT GFR
AG Ratio: 1.6 (calc) (ref 1.0–2.5)
ALT: 11 U/L (ref 6–29)
AST: 12 U/L (ref 10–35)
Albumin: 4.2 g/dL (ref 3.6–5.1)
Alkaline phosphatase (APISO): 72 U/L (ref 37–153)
BUN: 14 mg/dL (ref 7–25)
CO2: 33 mmol/L — ABNORMAL HIGH (ref 20–32)
Calcium: 10.2 mg/dL (ref 8.6–10.4)
Chloride: 105 mmol/L (ref 98–110)
Creat: 0.95 mg/dL (ref 0.60–1.00)
Globulin: 2.7 g/dL (ref 1.9–3.7)
Glucose, Bld: 131 mg/dL — ABNORMAL HIGH (ref 65–99)
Potassium: 4.4 mmol/L (ref 3.5–5.3)
Sodium: 146 mmol/L (ref 135–146)
Total Bilirubin: 0.5 mg/dL (ref 0.2–1.2)
Total Protein: 6.9 g/dL (ref 6.1–8.1)
eGFR: 61 mL/min/1.73m2

## 2023-05-02 LAB — CBC WITH DIFFERENTIAL/PLATELET
Absolute Lymphocytes: 1630 {cells}/uL (ref 850–3900)
Absolute Monocytes: 410 {cells}/uL (ref 200–950)
Basophils Absolute: 51 {cells}/uL (ref 0–200)
Basophils Relative: 0.9 %
Eosinophils Absolute: 160 {cells}/uL (ref 15–500)
Eosinophils Relative: 2.8 %
HCT: 39.9 % (ref 35.0–45.0)
Hemoglobin: 13.2 g/dL (ref 11.7–15.5)
MCH: 32 pg (ref 27.0–33.0)
MCHC: 33.1 g/dL (ref 32.0–36.0)
MCV: 96.8 fL (ref 80.0–100.0)
MPV: 11.3 fL (ref 7.5–12.5)
Monocytes Relative: 7.2 %
Neutro Abs: 3449 {cells}/uL (ref 1500–7800)
Neutrophils Relative %: 60.5 %
Platelets: 166 Thousand/uL (ref 140–400)
RBC: 4.12 Million/uL (ref 3.80–5.10)
RDW: 12.7 % (ref 11.0–15.0)
Total Lymphocyte: 28.6 %
WBC: 5.7 Thousand/uL (ref 3.8–10.8)

## 2023-05-02 LAB — HEMOGLOBIN A1C
Hgb A1c MFr Bld: 5.7 %{Hb} — ABNORMAL HIGH
Mean Plasma Glucose: 117 mg/dL
eAG (mmol/L): 6.5 mmol/L

## 2023-05-02 LAB — LIPID PANEL
Cholesterol: 116 mg/dL
HDL: 63 mg/dL
LDL Cholesterol (Calc): 33 mg/dL
Non-HDL Cholesterol (Calc): 53 mg/dL
Total CHOL/HDL Ratio: 1.8 (calc)
Triglycerides: 122 mg/dL

## 2023-05-02 LAB — TEST AUTHORIZATION

## 2023-05-02 NOTE — Addendum Note (Signed)
Addended by: Margarita Mail on: 05/02/2023 10:05 AM   Modules accepted: Orders

## 2023-07-10 ENCOUNTER — Ambulatory Visit (INDEPENDENT_AMBULATORY_CARE_PROVIDER_SITE_OTHER): Admitting: Internal Medicine

## 2023-07-10 ENCOUNTER — Encounter: Payer: Self-pay | Admitting: Internal Medicine

## 2023-07-10 ENCOUNTER — Other Ambulatory Visit: Payer: Self-pay

## 2023-07-10 VITALS — BP 128/84 | HR 79 | Temp 97.7°F | Resp 16 | Ht 68.0 in | Wt 208.0 lb

## 2023-07-10 DIAGNOSIS — K5900 Constipation, unspecified: Secondary | ICD-10-CM

## 2023-07-10 NOTE — Patient Instructions (Addendum)
 It was great seeing you today!  Plan discussed at today's visit: -Recommend increasing hydration and dietary fiber (more info below), can take sugar free fiber gummies (Vitafusion) but start low and increase slowly -Also recommend Miralax - start with 1 capful daily but can try prune juice as well and stool softener as needed -Can take a probiotic to help with gas production - recommend Align brand   Follow up in: already scheduled in July   Take care and let us know if you have any questions or concerns prior to your next visit.  Dr. Caralee Ates  Constipation, Adult Constipation is when a person has fewer than three bowel movements in a week, has difficulty having a bowel movement, or has stools (feces) that are dry, hard, or larger than normal. Constipation may be caused by an underlying condition. It may become worse with age if a person takes certain medicines and does not take in enough fluids. Follow these instructions at home: Eating and drinking  Eat foods that have a lot of fiber, such as beans, whole grains, and fresh fruits and vegetables. Limit foods that are low in fiber and high in fat and processed sugars, such as fried or sweet foods. These include french fries, hamburgers, cookies, candies, and soda. Drink enough fluid to keep your urine pale yellow. General instructions Exercise regularly or as told by your health care provider. Try to do 150 minutes of moderate exercise each week. Use the bathroom when you have the urge to go. Do not hold it in. Take over-the-counter and prescription medicines only as told by your health care provider. This includes any fiber supplements. During bowel movements: Practice deep breathing while relaxing the lower abdomen. Practice pelvic floor relaxation. Watch your condition for any changes. Let your health care provider know about them. Keep all follow-up visits as told by your health care provider. This is important. Contact a health care  provider if: You have pain that gets worse. You have a fever. You do not have a bowel movement after 4 days. You vomit. You are not hungry or you lose weight. You are bleeding from the opening between the buttocks (anus). You have thin, pencil-like stools. Get help right away if: You have a fever and your symptoms suddenly get worse. You leak stool or have blood in your stool. Your abdomen is bloated. You have severe pain in your abdomen. You feel dizzy or you faint. Summary Constipation is when a person has fewer than three bowel movements in a week, has difficulty having a bowel movement, or has stools (feces) that are dry, hard, or larger than normal. Eat foods that have a lot of fiber, such as beans, whole grains, and fresh fruits and vegetables. Drink enough fluid to keep your urine pale yellow. Take over-the-counter and prescription medicines only as told by your health care provider. This includes any fiber supplements. This information is not intended to replace advice given to you by your health care provider. Make sure you discuss any questions you have with your health care provider. Document Revised: 02/08/2022 Document Reviewed: 02/08/2022 Elsevier Patient Education  2024 Elsevier Inc.  High-Fiber Eating Plan Fiber, also called dietary fiber, is found in foods such as fruits, vegetables, whole grains, and beans. A high-fiber diet can be good for your health. Your health care provider may recommend a high-fiber diet to help: Prevent trouble pooping (constipation). Lower your cholesterol. Treat the following conditions: Hemorrhoids. This is inflammation of veins in the anus. Inflammation  of specific areas of the digestive tract. Irritable bowel syndrome (IBS). This is a problem of the large intestine, also called the colon, that sometimes causes belly pain and bloating. Prevent overeating as part of a weight-loss plan. Lower the risk of heart disease, type 2 diabetes, and  certain cancers. What are tips for following this plan? Reading food labels  Check the nutrition facts label on foods for the amount of dietary fiber. Choose foods that have 4 grams of fiber or more per serving. The recommended goals for how much fiber you should eat each day include: Males 78 years old or younger: 30-34 g. Males over 32 years old: 28-34 g. Females 73 years old or younger: 25-28 g. Females over 98 years old: 22-25 g. Your daily fiber goal is _____________ g. Shopping Choose whole fruits and vegetables instead of processed. For example, choose apples instead of apple juice or applesauce. Choose a variety of high-fiber foods such as avocados, lentils, oats, and pinto beans. Read the nutrition facts label on foods. Check for foods with added fiber. These foods often have high sugar and salt (sodium) amounts per serving. Cooking Use whole-grain flour for baking and cooking. Cook with brown rice instead of white rice. Make meals that have a lot of beans and vegetables in them, such as chili or vegetable-based soups. Meal planning Start the day with a breakfast that is high in fiber, such as a cereal that has 5 g of fiber or more per serving. Eat breads and cereals that are made with whole-grain flour instead of refined flour or white flour. Eat brown rice, bulgur wheat, or millet instead of white rice. Use beans in place of meat in soups, salads, and pasta dishes. Be sure that half of the grains you eat each day are whole grains. General information You can get the recommended amount of dietary fiber by: Eating a variety of fruits, vegetables, grains, nuts, and beans. Taking a fiber supplement if you aren't able to eat enough fiber. It's better to get fiber through food than from a supplement. Slowly increase how much fiber you eat. If you increase the amount of fiber you eat too quickly, you may have bloating, cramping, or gas. Drink plenty of water to help you digest  fiber. Choose high-fiber snacks, such as berries, raw vegetables, nuts, and popcorn. What foods should I eat? Fruits Berries. Pears. Apples. Oranges. Avocado. Prunes and raisins. Dried figs. Vegetables Sweet potatoes. Spinach. Kale. Artichokes. Cabbage. Broccoli. Cauliflower. Green peas. Carrots. Squash. Grains Whole-grain breads. Multigrain cereal. Oats and oatmeal. Brown rice. Barley. Bulgur wheat. Millet. Quinoa. Bran muffins. Popcorn. Rye wafer crackers. Meats and other proteins Navy beans, kidney beans, and pinto beans. Soybeans. Split peas. Lentils. Nuts and seeds. Dairy Fiber-fortified yogurt. Fortified means that fiber has been added to the product. Beverages Fiber-fortified soy milk. Fiber-fortified orange juice. Other foods Fiber bars. The items listed above may not be all the foods and drinks you can have. Talk to a dietitian to learn more. What foods should I avoid? Fruits Fruit juice. Cooked, strained fruit. Vegetables Fried potatoes. Canned vegetables. Well-cooked vegetables. Grains White bread. Pasta made with refined flour. White rice. Meats and other proteins Fatty meat. Fried chicken or fried fish. Dairy Milk. Cream cheese. Sour cream. Fats and oils Butters. Beverages Soft drinks. Other foods Cakes and pastries. The items listed above may not be all the foods and drinks you should avoid. Talk to a dietitian to learn more. This information is not intended to  replace advice given to you by your health care provider. Make sure you discuss any questions you have with your health care provider. Document Revised: 06/19/2022 Document Reviewed: 06/19/2022 Elsevier Patient Education  2024 [employer].

## 2023-07-10 NOTE — Progress Notes (Signed)
 Established Patient Office Visit  Subjective   Patient ID: MARRAH VANEVERY, female    DOB: 03-23-1946  Age: 78 y.o. MRN: [id number]  Chief Complaint  Patient presents with   Constipation    Gas, bowel issues for 3 months    [name] presents for constipation and gas.  Abdominal Complaint: -Duration:  on and off for a few weeks -Frequency: occasional -Nature: cramping -Location: diffuse  -Severity: mild  -Radiation: no -Alleviating factors: having a BM -Treatments attempted: increasing hydration, stool softener  -Constipation: yes -Diarrhea: no -Mucous in the stool: no -Bloating:yes -Passing Gas: yes -Nausea: no -Vomiting: no -Melena or hematochezia: no -Patient had a colonoscopy in May 2021 which was normal and she was told she does not need to repeat this   Patient Active Problem List   Diagnosis Date Noted   Elevated blood pressure reading 11/17/2021   Class 2 severe obesity with serious comorbidity and body mass index (BMI) of 35.0 to 35.9 in adult, unspecified obesity type (HCC) 05/20/2021   Lymphedema 02/26/2020   History of colonic polyps    Bilateral lower extremity edema 09/27/2017   Emphysema of lung (HCC) 08/14/2017   Ganglion cyst of finger 06/25/2017   Full code status 06/25/2017   Osteoarthritis of knee 06/12/2017   Baker's cyst of knee, right 06/04/2017   Thoracic aortic atherosclerosis (HCC) 08/01/2016   Osteopenia 06/15/2016   Obesity (BMI 30.0-34.9) 05/29/2016   Urinary hesitancy 02/14/2016   PAD (peripheral artery disease) (HCC) 09/09/2015   Coronary artery calcification seen on CT scan 07/20/2015   Personal history of tobacco use, presenting hazards to health 07/14/2015   Foraminal stenosis of lumbar region 01/28/2015   Facet arthropathy, lumbar 01/28/2015   Spinal stenosis of lumbar region 01/28/2015   Neuropathy of both feet 01/07/2015   Leg pain, bilateral 01/07/2015   Leg heaviness 01/07/2015   CKD (chronic kidney  disease) stage 2, GFR 60-89 ml/min 10/17/2014   Allergic rhinitis 10/14/2014   Dyslipidemia 10/14/2014   Hypertension    Vitamin D deficiency disease    GERD (gastroesophageal reflux disease) 02/02/2014   Past Medical History:  Diagnosis Date   Allergy    Baker's cyst of knee, right 06/04/2017   [name] Feb 2019   CKD (chronic kidney disease) stage 2, GFR 60-89 ml/min    Dyslipidemia    GERD (gastroesophageal reflux disease)    Hypertension    Lumbar spinal stenosis    Neuralgia neuritis, sciatic nerve 08/28/2012   Neuropathy    both feet   Obesity (BMI 30.0-34.9) 05/29/2016   Osteopenia 06/15/2016   March 2018   Personal history of tobacco use, presenting hazards to health 07/14/2015   Reflux    Sciatica    Thoracic aortic atherosclerosis (HCC) 08/01/2016   Chest CT   Venous stasis    Vitamin D deficiency disease    Wears dentures    Full upper, partial lower   Past Surgical History:  Procedure Laterality Date   ABDOMINAL HYSTERECTOMY     complete, due to heavy   CATARACT EXTRACTION, BILATERAL     COLONOSCOPY WITH PROPOFOL N/A 08/29/2019   Procedure: COLONOSCOPY WITH PROPOFOL;  Surgeon: Midge Minium, MD;  Location: [facility];  Service: Endoscopy;  Laterality: N/A;  priority 4   ECTOPIC PREGNANCY SURGERY     Social History   Tobacco Use   Smoking status: Former    Current packs/day: 0.00    Average packs/day: 1 pack/day for 30.0 years (30.0 ttl  pk-yrs)    Types: Cigarettes    Start date: 10/14/1975    Quit date: 10/13/2005    Years since quitting: 17.7   Smokeless tobacco: Former    Types: Snuff    Quit date: 10/09/2011  Vaping Use   Vaping status: Never Used  Substance Use Topics   Alcohol use: No    Alcohol/week: 0.0 standard drinks of alcohol   Drug use: Yes    Comment: rare   Social History   Socioeconomic History   Marital status: Married    Spouse name: Not on file   Number of children: 1   Years of education: Not on file   Highest education level:  12th grade  Occupational History   Occupation: retired  Tobacco Use   Smoking status: Former    Current packs/day: 0.00    Average packs/day: 1 pack/day for 30.0 years (30.0 ttl pk-yrs)    Types: Cigarettes    Start date: 10/14/1975    Quit date: 10/13/2005    Years since quitting: 17.7   Smokeless tobacco: Former    Types: Snuff    Quit date: 10/09/2011  Vaping Use   Vaping status: Never Used  Substance and Sexual Activity   Alcohol use: No    Alcohol/week: 0.0 standard drinks of alcohol   Drug use: Yes    Comment: rare   Sexual activity: Yes  Other Topics Concern   Not on file  Social History Narrative   Lives with husband in a one story home.  Has one daughter.     Retired from [employer] work.     Education: high school.   Social Drivers of Corporate investment banker Strain: Low Risk  (09/29/2022)   Overall Financial Resource Strain (CARDIA)    Difficulty of Paying Living Expenses: Not very hard  Food Insecurity: No Food Insecurity (09/29/2022)   Hunger Vital Sign    Worried About Running Out of Food in the Last Year: Never true    Ran Out of Food in the Last Year: Never true  Transportation Needs: No Transportation Needs (09/29/2022)   PRAPARE - Administrator, Civil Service (Medical): No    Lack of Transportation (Non-Medical): No  Physical Activity: Insufficiently Active (09/29/2022)   Exercise Vital Sign    Days of Exercise per Week: 3 days    Minutes of Exercise per Session: 30 min  Stress: No Stress Concern Present (09/29/2022)   [employer] of Occupational Health - Occupational Stress Questionnaire    Feeling of Stress : Not at all  Social Connections: Moderately Integrated (09/29/2022)   Social Connection and Isolation Panel [NHANES]    Frequency of Communication with Friends and Family: More than three times a week    Frequency of Social Gatherings with Friends and Family: Three times a week    Attends Religious Services: More than 4 times per year     Active Member of Clubs or Organizations: No    Attends Banker Meetings: Never    Marital Status: Married  Catering manager Violence: Not At Risk (09/29/2022)   Humiliation, Afraid, Rape, and Kick questionnaire    Fear of Current or Ex-Partner: No    Emotionally Abused: No    Physically Abused: No    Sexually Abused: No   Family Status  Relation Name Status   Mother  Deceased       alzheimers   Father  Deceased       prostate cancer  Brother youngest Deceased       throat cancer   Brother middle Alive   Brother oldest Alive   Oceanographer  (Not Specified)   Daughter  (Not Specified)   Brother  Deceased       cirrhosis of liver   MGM  Deceased       old age    MGF  Deceased   PGM  Deceased       natural complications   PGF  Deceased       old age   No partnership data on file   Family History  Problem Relation Age of Onset   Cancer Mother        colon   Hypertension Mother    Alzheimer's disease Mother    Cancer Father        prostate   Cancer Brother        lung   Stroke Brother    Cancer Brother        prostate and brain   Cancer Brother        throat   Diabetes Paternal Aunt    Breast cancer Paternal Aunt    Healthy Daughter    Cirrhosis Brother    Allergies  Allergen Reactions   Atorvastatin Itching   Codeine Sulfate Itching   Lyrica Cr [Pregabalin Er]     Blurred vision and swelling in legs   Pravastatin Nausea Only   Protonix [Pantoprazole Sodium] Other (See Comments)    Legs felt heavy   Tylenol With Codeine #3  [Acetaminophen-Codeine] Itching   Penicillin G Rash      Review of Systems  Gastrointestinal:  Positive for abdominal pain and constipation. Negative for blood in stool, diarrhea, melena, nausea and vomiting.  All other systems reviewed and are negative.     Objective:     BP 128/84 (Cuff Size: Large)   Pulse 79   Temp 97.7 F (36.5 C) (Oral)   Resp 16   Ht 5\' 8"  (1.727 m)   Wt 208 lb (94.3 kg)   LMP  (LMP  Unknown)   SpO2 97%   BMI 31.63 kg/m  BP Readings from Last 3 Encounters:  07/10/23 128/84  05/01/23 136/72  10/26/22 132/78   Wt Readings from Last 3 Encounters:  07/10/23 208 lb (94.3 kg)  05/01/23 212 lb 3.2 oz (96.3 kg)  10/26/22 203 lb 12.8 oz (92.4 kg)      Physical Exam Constitutional:      Appearance: Normal appearance.  HENT:     Head: Normocephalic and atraumatic.  Eyes:     Conjunctiva/sclera: Conjunctivae normal.  Cardiovascular:     Rate and Rhythm: Normal rate and regular rhythm.  Pulmonary:     Effort: Pulmonary effort is normal.     Breath sounds: Normal breath sounds.  Abdominal:     General: There is no distension.     Palpations: Abdomen is soft.     Tenderness: There is no abdominal tenderness. There is no guarding or rebound.  Musculoskeletal:     Right knee: Normal. No swelling or bony tenderness. No tenderness.     Left knee: Normal. No swelling or bony tenderness. No tenderness.  Skin:    General: Skin is warm and dry.  Neurological:     General: No focal deficit present.     Mental Status: She is alert. Mental status is at baseline.  Psychiatric:        Mood and Affect: Mood normal.  Behavior: Behavior normal.      No results found for any visits on 07/10/23.  Last CBC Lab Results  Component Value Date   WBC 5.7 05/01/2023   HGB 13.2 05/01/2023   HCT 39.9 05/01/2023   MCV 96.8 05/01/2023   MCH 32.0 05/01/2023   RDW 12.7 05/01/2023   PLT 166 05/01/2023   Last metabolic panel Lab Results  Component Value Date   GLUCOSE 131 (H) 05/01/2023   NA 146 05/01/2023   K 4.4 05/01/2023   CL 105 05/01/2023   CO2 33 (H) 05/01/2023   BUN 14 05/01/2023   CREATININE 0.95 05/01/2023   EGFR 61 05/01/2023   CALCIUM 10.2 05/01/2023   PROT 6.9 05/01/2023   ALBUMIN 4.1 05/28/2017   LABGLOB 2.6 05/07/2015   AGRATIO 1.5 05/07/2015   BILITOT 0.5 05/01/2023   ALKPHOS 80 05/28/2017   AST 12 05/01/2023   ALT 11 05/01/2023   ANIONGAP 6  05/28/2017   Last lipids Lab Results  Component Value Date   CHOL 116 05/01/2023   HDL 63 05/01/2023   LDLCALC 33 05/01/2023   TRIG 122 05/01/2023   CHOLHDL 1.8 05/01/2023   Last hemoglobin A1c Lab Results  Component Value Date   HGBA1C 5.7 (H) 05/01/2023   Last thyroid functions Lab Results  Component Value Date   TSH 2.59 03/29/2022   Last vitamin D Lab Results  Component Value Date   VD25OH 48 03/29/2022   Last vitamin B12 and Folate Lab Results  Component Value Date   VITAMINB12 406 03/29/2022   FOLATE 17.9 03/29/2022      The ASCVD Risk score (Arnett DK, et al., 2019) failed to calculate for the following reasons:   The valid total cholesterol range is 130 to 320 mg/dL    Assessment & Plan:   1. Constipation, unspecified constipation type (Primary): Discussed increasing hydration and dietary fiber.  Information printed on foods high in fiber, also discussed starting a fiber supplement.  Recommend 1 capful of MiraLAX daily to help have 1 soft bowel movement, can titrate as needed.  Also recommend a probiotic to help with gas and bloating pain.  Patient can take stool softener as needed.  Return for already scheduled.    Margarita Mail, DO

## 2023-09-10 DIAGNOSIS — H1045 Other chronic allergic conjunctivitis: Secondary | ICD-10-CM | POA: Diagnosis not present

## 2023-09-10 DIAGNOSIS — H26493 Other secondary cataract, bilateral: Secondary | ICD-10-CM | POA: Diagnosis not present

## 2023-09-10 DIAGNOSIS — Z961 Presence of intraocular lens: Secondary | ICD-10-CM | POA: Diagnosis not present

## 2023-10-04 ENCOUNTER — Ambulatory Visit: Payer: Medicare HMO

## 2023-10-04 DIAGNOSIS — Z0000 Encounter for general adult medical examination without abnormal findings: Secondary | ICD-10-CM | POA: Diagnosis not present

## 2023-10-04 NOTE — Progress Notes (Signed)
 Subjective:   Dana Peters is a 78 y.o. who presents for a Medicare Wellness preventive visit.  As a reminder, Annual Wellness Visits don't include a physical exam, and some assessments may be limited, especially if this visit is performed virtually. We may recommend an in-person follow-up visit with your provider if needed.  Visit Complete: Virtual I connected with  Almarie DELENA Childes on 10/04/23 by a audio enabled telemedicine application and verified that I am speaking with the correct person using two identifiers.  Patient Location: Home  Provider Location: Home Office  I discussed the limitations of evaluation and management by telemedicine. The patient expressed understanding and agreed to proceed.  Vital Signs: Because this visit was a virtual/telehealth visit, some criteria may be missing or patient reported. Any vitals not documented were not able to be obtained and vitals that have been documented are patient reported.  VideoDeclined- This patient declined Librarian, academic. Therefore the visit was completed with audio only.  Persons Participating in Visit: Patient.  AWV Questionnaire: No: Patient Medicare AWV questionnaire was not completed prior to this visit.  Cardiac Risk Factors include: advanced age (>20men, >45 women);dyslipidemia;hypertension;obesity (BMI >30kg/m2);sedentary lifestyle     Objective:    There were no vitals filed for this visit. There is no height or weight on file to calculate BMI.     10/04/2023    8:58 AM 09/29/2022    9:01 AM 08/06/2022    2:02 PM 04/24/2022    2:11 PM 09/13/2021    9:59 AM 08/31/2020    9:34 AM 08/29/2019    8:47 AM  Advanced Directives  Does Patient Have a Medical Advance Directive? No Yes Yes Yes Yes No No  Type of Special educational needs teacher of [place];Living will Healthcare Power of [place];Living will Healthcare Power of [place];Living will Healthcare Power of [place];Living  will    Does patient want to make changes to medical advance directive?       No - Patient declined  Copy of Healthcare Power of Attorney in Chart?     No - copy requested    Would patient like information on creating a medical advance directive? No - Patient declined     Yes (MAU/Ambulatory/Procedural Areas - Information given)     Current Medications (verified) Outpatient Encounter Medications as of 10/04/2023  Medication Sig   aspirin  EC 81 MG tablet Take 1 tablet (81 mg total) by mouth daily.   Cholecalciferol (VITAMIN D ) 2000 units CAPS Take by mouth daily.   DULoxetine  (CYMBALTA ) 60 MG capsule Take 1 capsule (60 mg total) by mouth daily.   ezetimibe  (ZETIA ) 10 MG tablet Take 1 tablet (10 mg total) by mouth daily.   lidocaine  (LIDODERM ) 5 % Place 1 patch onto the skin daily. Remove & Discard patch within 12 hours or as directed by MD   loratadine  (CLARITIN ) 10 MG tablet Take 1 tablet (10 mg total) by mouth daily as needed.   rosuvastatin  (CRESTOR ) 5 MG tablet TAKE 1 TABLET BY MOUTH EVERY OTHER DAY AT BEDTIME FOR  ATHEROSCLEROTIC  DISEASE   No facility-administered encounter medications on file as of 10/04/2023.    Allergies (verified) Atorvastatin , Codeine sulfate, Lyrica  cr [pregabalin  er], Pravastatin , Protonix [pantoprazole sodium], Tylenol with codeine #3  [acetaminophen-codeine], and Penicillin g   History: Past Medical History:  Diagnosis Date   Allergy     Baker's cyst of knee, right 06/04/2017   US  Feb 2019   CKD (chronic kidney disease) stage  2, GFR 60-89 ml/min    Dyslipidemia    GERD (gastroesophageal reflux disease)    Hypertension    Lumbar spinal stenosis    Neuralgia neuritis, sciatic nerve 08/28/2012   Neuropathy    both feet   Obesity (BMI 30.0-34.9) 05/29/2016   Osteopenia 06/15/2016   March 2018   Personal history of tobacco use, presenting hazards to health 07/14/2015   Reflux    Sciatica    Thoracic aortic atherosclerosis (HCC) 08/01/2016   Chest CT    Venous stasis    Vitamin D  deficiency disease    Wears dentures    Full upper, partial lower   Past Surgical History:  Procedure Laterality Date   ABDOMINAL HYSTERECTOMY     complete, due to heavy   CATARACT EXTRACTION, BILATERAL     COLONOSCOPY WITH PROPOFOL  N/A 08/29/2019   Procedure: COLONOSCOPY WITH PROPOFOL ;  Surgeon: Jinny Carmine, MD;  Location[facility];  Service: Endoscopy;  Laterality: N/A;  priority 4   ECTOPIC PREGNANCY SURGERY     Family History  Problem Relation Age of Onset   Cancer Mother        colon   Hypertension Mother    Alzheimer's disease Mother    Cancer Father        prostate   Cancer Brother        lung   Stroke Brother    Cancer Brother        prostate and brain   Cancer Brother        throat   Diabetes Paternal Aunt    Breast cancer Paternal Aunt    Healthy Daughter    Cirrhosis Brother    Social History   Socioeconomic History   Marital status: Married    Spouse name: Not on file   Number of children: 1   Years of education: Not on file   Highest education level: 12th grade  Occupational History   Occupation: retired  Tobacco Use   Smoking status: Former    Current packs/day: 0.00    Average packs/day: 1 pack/day for 30.0 years (30.0 ttl pk-yrs)    Types: Cigarettes    Start date: 10/14/1975    Quit date: 10/13/2005    Years since quitting: 17.9   Smokeless tobacco: Former    Types: Snuff    Quit date: 10/09/2011  Vaping Use   Vaping status: Never Used  Substance and Sexual Activity   Alcohol use: No    Alcohol/week: 0.0 standard drinks of alcohol   Drug use: Yes    Comment: rare   Sexual activity: Yes  Other Topics Concern   Not on file  Social History Narrative   Lives with husband in a one story home.  Has one daughter.     Retired from [employer] work.     Education: high school.   Social Drivers of Corporate investment banker Strain: Low Risk  (10/04/2023)   Overall Financial Resource Strain (CARDIA)     Difficulty of Paying Living Expenses: Not hard at all  Food Insecurity: No Food Insecurity (10/04/2023)   Hunger Vital Sign    Worried About Running Out of Food in the Last Year: Never true    Ran Out of Food in the Last Year: Never true  Transportation Needs: No Transportation Needs (10/04/2023)   PRAPARE - Administrator, Civil Service (Medical): No    Lack of Transportation (Non-Medical): No  Physical Activity: Insufficiently Active (10/04/2023)  Exercise Vital Sign    Days of Exercise per Week: 3 days    Minutes of Exercise per Session: 30 min  Stress: No Stress Concern Present (10/04/2023)   [employer] of Occupational Health - Occupational Stress Questionnaire    Feeling of Stress: Not at all  Social Connections: Moderately Integrated (10/04/2023)   Social Connection and Isolation Panel    Frequency of Communication with Friends and Family: More than three times a week    Frequency of Social Gatherings with Friends and Family: Three times a week    Attends Religious Services: More than 4 times per year    Active Member of Clubs or Organizations: No    Attends Banker Meetings: Never    Marital Status: Married    Tobacco Counseling Counseling given: Not Answered    Clinical Intake:  Pre-visit preparation completed: Yes  Pain : No/denies pain     BMI - recorded: 31.6 Nutritional Status: BMI > 30  Obese Nutritional Risks: None Diabetes: No  Lab Results  Component Value Date   HGBA1C 5.7 (H) 05/01/2023   HGBA1C 5.5 03/29/2022   HGBA1C 5.6 09/10/2019     How often do you need to have someone help you when you read instructions, pamphlets, or other written materials from your doctor or pharmacy?: 1 - Never  Interpreter Needed?: No  Information entered by :: JHONNIE DAS, LPN   Activities of Daily Living    10/04/2023    8:59 AM 05/01/2023   10:04 AM  In your present state of health, do you have any difficulty performing the  following activities:  Hearing? 1 0  Vision? 0 0  Difficulty concentrating or making decisions? 0 0  Walking or climbing stairs? 1 0  Comment TAKES TIME D/T NEUROPATHY   Dressing or bathing? 0 0  Doing errands, shopping? 0   Preparing Food and eating ? N   Using the Toilet? N   In the past six months, have you accidently leaked urine? N   Do you have problems with loss of bowel control? N   Managing your Medications? N   Managing your Finances? N   Housekeeping or managing your Housekeeping? N     Patient Care Team: Bernardo Fend, DO as PCP - [facility]  I have updated your Care Teams any recent Medical Services you may have received from other providers in the past year.     Assessment:   This is a routine wellness examination for [place].  Hearing/Vision screen Hearing Screening - Comments:: NO AIDS- LITTLE HOH Vision Screening - Comments:: READERS, HAD CATARACT SGY- DR.[email or web address]   Goals Addressed             This Visit's Progress    DIET - EAT MORE FRUITS AND VEGETABLES         Depression Screen     10/04/2023    8:56 AM 05/01/2023   10:04 AM 10/26/2022    9:32 AM 09/29/2022    9:00 AM 09/08/2022   11:12 AM 08/24/2022    1:05 PM 04/27/2022    9:36 AM  PHQ 2/9 Scores  PHQ - 2 Score 0 0 0 0 0 0 0  PHQ- 9 Score 0  0  0 0 0    Fall Risk     10/04/2023    8:59 AM 05/01/2023   10:04 AM 10/26/2022    9:29 AM 09/29/2022    8:55 AM 09/08/2022  11:11 AM  Fall Risk   Falls in the past year? 0 0 0 0 0  Number falls in past yr: 0 0 0 0 0  Injury with Fall? 0 0 0 0 0  Risk for fall due to : No Fall Risks No Fall Risks  No Fall Risks No Fall Risks  Follow up Falls evaluation completed Falls evaluation completed  Education provided;Falls prevention discussed Falls prevention discussed;Education provided;Falls evaluation completed    MEDICARE RISK AT HOME:  Medicare Risk at Home Any stairs in or around the home?: Yes If  so, are there any without handrails?: No Home free of loose throw rugs in walkways, pet beds, electrical cords, etc?: Yes Adequate lighting in your home to reduce risk of falls?: Yes Life alert?: No Use of a cane, walker or w/c?: No Grab bars in the bathroom?: No Shower chair or bench in shower?: No Elevated toilet seat or a handicapped toilet?: Yes  TIMED UP AND GO:  Was the test performed?  No  Cognitive Function: 6CIT completed        10/04/2023    9:02 AM 09/29/2022    9:03 AM 08/31/2020    9:40 AM 07/10/2019    9:03 AM 06/27/2018   11:06 AM  6CIT Screen  What Year? 0 points 0 points 0 points 0 points 0 points  What month? 0 points 0 points 0 points 0 points 0 points  What time? 3 points 0 points 0 points 0 points 0 points  Count back from 20 0 points 0 points 0 points 0 points 0 points  Months in reverse 4 points 0 points 0 points 2 points 0 points  Repeat phrase 0 points 2 points 0 points 2 points 2 points  Total Score 7 points 2 points 0 points 4 points 2 points    Immunizations Immunization History  Administered Date(s) Administered   Fluad Quad(high Dose 65+) 12/03/2018, 01/12/2020, 02/09/2021   Influenza, High Dose Seasonal PF 01/19/2017   Influenza, Quadrivalent, Recombinant, Inj, Pf 01/24/2018   Influenza-Unspecified 03/18/2014, 01/05/2015, 01/03/2016, 12/29/2021, 02/01/2023   Moderna Sars-Covid-2 Vaccination 05/06/2019, 06/03/2019, 01/31/2020, 07/27/2020   PNEUMOCOCCAL CONJUGATE-20 12/31/2020, 05/20/2021   Pneumococcal Conjugate-13 11/18/2013   Pneumococcal Polysaccharide-23 05/15/2012, 01/10/2014   Respiratory Syncytial Virus Vaccine,Recomb Aduvanted(Arexvy) 12/29/2021   Td 01/10/2014   Tdap 11/22/2011   Unspecified SARS-COV-2 Vaccination 02/01/2023   Zoster Recombinant(Shingrix) 10/15/2020, 12/31/2020   Zoster, Live 01/17/2012, 05/22/2014    Screening Tests Health Maintenance  Topic Date Due   COVID-19 Vaccine (6 - 2024-25 season) 08/02/2023    INFLUENZA VACCINE  11/09/2023   DTaP/Tdap/Td (3 - Td or Tdap) 01/11/2024   MAMMOGRAM  01/29/2024   Colonoscopy  08/28/2024   Medicare Annual Wellness (AWV)  10/03/2024   DEXA SCAN  10/09/2027   Pneumococcal Vaccine: 50+ Years  Completed   Hepatitis C Screening  Completed   Zoster Vaccines- Shingrix  Completed   Hepatitis B Vaccines  Aged Out   HPV VACCINES  Aged Out   Meningococcal B Vaccine  Aged Out    Health Maintenance  Health Maintenance Due  Topic Date Due   COVID-19 Vaccine (6 - 2024-25 season) 08/02/2023   Health Maintenance Items Addressed: UP TO DATE W/ SHOTS;UP TO DATE ON COLONOSCOPY, MAMMOGRAM & BDS   Additional Screening:  Vision Screening: Recommended annual ophthalmology exams for early detection of glaucoma and other disorders of the eye. Would you like a referral to an eye doctor? No    Dental Screening: Recommended annual  dental exams for proper oral hygiene  Community Resource Referral / Chronic Care Management: CRR required this visit?  No   CCM required this visit?  No   Plan:    I have personally reviewed and noted the following in the patient's chart:   Medical and social history Use of alcohol, tobacco or illicit drugs  Current medications and supplements including opioid prescriptions. Patient is not currently taking opioid prescriptions. Functional ability and status Nutritional status Physical activity Advanced directives List of other physicians Hospitalizations, surgeries, and ER visits in previous 12 months Vitals Screenings to include cognitive, depression, and falls Referrals and appointments  In addition, I have reviewed and discussed with patient certain preventive protocols, quality metrics, and best practice recommendations. A written personalized care plan for preventive services as well as general preventive health recommendations were provided to patient.   Jhonnie GORMAN Das, LPN   [id number]   After Visit Summary:  (MyChart) Due to this being a telephonic visit, the after visit summary with patients personalized plan was offered to patient via MyChart   Notes: Nothing significant to report at this time.

## 2023-10-04 NOTE — Patient Instructions (Addendum)
 [name] , Thank you for taking time out of your busy schedule to complete your Annual Wellness Visit with me. I enjoyed our conversation and look forward to speaking with you again next year. I, as well as your care team,  appreciate your ongoing commitment to your health goals. Please review the following plan we discussed and let me know if I can assist you in the future.    Follow up Visits: Next Medicare AWV with our clinical staff:  10/09/24 @ 2:00 PM BY PHONE  Have you seen your provider in the last 6 months (3 months if uncontrolled diabetes)? Yes   Clinician Recommendations:  Aim for 30 minutes of exercise or brisk walking, 6-8 glasses of water , and 5 servings of fruits and vegetables each day. TAKE CARE!      This is a list of the screening recommended for you and due dates:  Health Maintenance  Topic Date Due   COVID-19 Vaccine (6 - 2024-25 season) 08/02/2023   Flu Shot  11/09/2023   DTaP/Tdap/Td vaccine (3 - Td or Tdap) 01/11/2024   Mammogram  01/29/2024   Colon Cancer Screening  08/28/2024   Medicare Annual Wellness Visit  10/03/2024   DEXA scan (bone density measurement)  10/09/2027   Pneumococcal Vaccine for age over 65  Completed   Hepatitis C Screening  Completed   Zoster (Shingles) Vaccine  Completed   Hepatitis B Vaccine  Aged Out   HPV Vaccine  Aged Out   Meningitis B Vaccine  Aged Out    Advanced directives: (ACP Link)Information on Advanced Care Planning can be found at [employer] Advance Health Care Directives Advance Health Care Directives. [email or web address]  Advance Care Planning is important because it:  [x]  Makes sure you receive the medical care that is consistent with your values, goals, and preferences  [x]  It provides guidance to your family and loved ones and reduces their decisional burden about whether or not they are making the right decisions based on your wishes.  Follow the link provided in your after visit summary or read over  the paperwork we have mailed to you to help you started getting your Advance Directives in place. If you need assistance in completing these, please reach out to us  so that we can help you!

## 2023-10-29 ENCOUNTER — Other Ambulatory Visit: Payer: Self-pay

## 2023-10-29 ENCOUNTER — Encounter: Payer: Self-pay | Admitting: Internal Medicine

## 2023-10-29 ENCOUNTER — Ambulatory Visit (INDEPENDENT_AMBULATORY_CARE_PROVIDER_SITE_OTHER): Payer: Self-pay | Admitting: Internal Medicine

## 2023-10-29 VITALS — BP 128/72 | HR 79 | Temp 98.2°F | Resp 16 | Ht 68.0 in | Wt 214.3 lb

## 2023-10-29 DIAGNOSIS — R600 Localized edema: Secondary | ICD-10-CM

## 2023-10-29 DIAGNOSIS — I2510 Atherosclerotic heart disease of native coronary artery without angina pectoris: Secondary | ICD-10-CM | POA: Diagnosis not present

## 2023-10-29 DIAGNOSIS — E785 Hyperlipidemia, unspecified: Secondary | ICD-10-CM

## 2023-10-29 DIAGNOSIS — G5793 Unspecified mononeuropathy of bilateral lower limbs: Secondary | ICD-10-CM

## 2023-10-29 DIAGNOSIS — R7303 Prediabetes: Secondary | ICD-10-CM

## 2023-10-29 LAB — POCT GLYCOSYLATED HEMOGLOBIN (HGB A1C): Hemoglobin A1C: 5.6 % (ref 4.0–5.6)

## 2023-10-29 MED ORDER — EZETIMIBE 10 MG PO TABS: 10.0000 mg | ORAL_TABLET | Freq: Every day | ORAL | 1 refills | Status: DC

## 2023-10-29 MED ORDER — DULOXETINE HCL 60 MG PO CPEP: 60.0000 mg | ORAL_CAPSULE | Freq: Every day | ORAL | 1 refills | Status: DC

## 2023-10-29 MED ORDER — ROSUVASTATIN CALCIUM 5 MG PO TABS: ORAL_TABLET | ORAL | 1 refills | Status: DC

## 2023-10-29 NOTE — Progress Notes (Signed)
 Established Patient Office Visit  Subjective   Patient ID: Dana Peters, female    DOB: [birth year]  Age: 78 y.o. MRN: [id number]  Chief Complaint  Patient presents with   Medical Management of Chronic Issues    6 month recheck    HPI  [name] presents for follow up on chronic medical conditions.   Discussed the use of AI scribe software for clinical note transcription with the patient, who gave verbal consent to proceed.  History of Present Illness Dana Peters is a 78 year old female with prediabetes who presents for a follow-up visit to check her A1c levels.  Her A1c levels have previously been at 5.7, indicating prediabetes, with minor fluctuations but generally stable. She is here to ensure her levels have not increased.  She experiences swelling in her feet, which she associates with prolonged sitting during a recent trip to [facility]. The swelling is atypical for her and mild.  Her current medications include duloxetine , Zetia , Crestor , and an over-the-counter allergy  pill. She had tried CBD gummies, which initially relieved soreness and pain, but she returned to duloxetine  after increased discomfort.  Neuropathy: -Symptoms on bilateral bottoms of feet, chronic. Improved.    Treatments attempted: Had been on Gabapentin  before but was discontinued due to an issue with her kidneys  -She is currently on Cymbalta  60 mg and also using CBD gummies -Patient states the Lyrica  cause blurred vision and swelling in her legs so she discontinued it.    HLD/PAD/CAD: -Medications: Crestor  5 mg, Zetia  10 mg, aspirin  81 mg -Patient is compliant with above medications and reports no side effects.  -Last lipid panel: Lipid Panel     Component Value Date/Time   CHOL 116 05/01/2023 1028   CHOL 177 05/07/2015 1131   TRIG 122 05/01/2023 1028   HDL 63 05/01/2023 1028   HDL 47 05/07/2015 1131   CHOLHDL 1.8 05/01/2023 1028   VLDL 39 (H) 10/26/2016 1123   LDLCALC 33  05/01/2023 1028   LABVLDL 40 05/07/2015 1131   Pre-Diabetes: -Last A1c 1/25 5.7% -Not on medications  Environmental Allergies: -Currently Claritin  10 mg, which controls symptoms well.   GERD: -Not currently on medication, diet controlled by avoiding triggering foods.  Vitamin D  Deficiency: -Currently on 2000 IU daily -Last vitamin D  level 48 12/23  Health Maintenance: -Blood work UTD -Mammogram 10/24 Birads-1 -DEXA 7/24 with osteopenia  -Colonoscopy 5/21 negative -Lung cancer screening: 9/23 Lung-RADS 2 , ordered previously   Patient Active Problem List   Diagnosis Date Noted   Elevated blood pressure reading 11/17/2021   Class 2 severe obesity with serious comorbidity and body mass index (BMI) of 35.0 to 35.9 in adult, unspecified obesity type (HCC) 05/20/2021   Lymphedema 02/26/2020   History of colonic polyps    Bilateral lower extremity edema 09/27/2017   Emphysema of lung (HCC) 08/14/2017   Ganglion cyst of finger 06/25/2017   Full code status 06/25/2017   Osteoarthritis of knee 06/12/2017   Baker's cyst of knee, right 06/04/2017   Thoracic aortic atherosclerosis (HCC) 08/01/2016   Osteopenia 06/15/2016   Obesity (BMI 30.0-34.9) 05/29/2016   Urinary hesitancy 02/14/2016   PAD (peripheral artery disease) (HCC) 09/09/2015   Coronary artery calcification seen on CT scan 07/20/2015   Personal history of tobacco use, presenting hazards to health 07/14/2015   Foraminal stenosis of lumbar region 01/28/2015   Facet arthropathy, lumbar 01/28/2015   Spinal stenosis of lumbar region 01/28/2015   Neuropathy of both feet  01/07/2015   Leg pain, bilateral 01/07/2015   Leg heaviness 01/07/2015   CKD (chronic kidney disease) stage 2, GFR 60-89 ml/min 10/17/2014   Allergic rhinitis 10/14/2014   Dyslipidemia 10/14/2014   Hypertension    Vitamin D  deficiency disease    GERD (gastroesophageal reflux disease) 02/02/2014   Past Medical History:  Diagnosis Date   Allergy      Baker's cyst of knee, right 06/04/2017   US  Feb 2019   CKD (chronic kidney disease) stage 2, GFR 60-89 ml/min    Dyslipidemia    GERD (gastroesophageal reflux disease)    Hypertension    Lumbar spinal stenosis    Neuralgia neuritis, sciatic nerve 08/28/2012   Neuropathy    both feet   Obesity (BMI 30.0-34.9) 05/29/2016   Osteopenia 06/15/2016   March 2018   Personal history of tobacco use, presenting hazards to health 07/14/2015   Reflux    Sciatica    Thoracic aortic atherosclerosis (HCC) 08/01/2016   Chest CT   Venous stasis    Vitamin D  deficiency disease    Wears dentures    Full upper, partial lower   Past Surgical History:  Procedure Laterality Date   ABDOMINAL HYSTERECTOMY     complete, due to heavy   CATARACT EXTRACTION, BILATERAL     COLONOSCOPY WITH PROPOFOL  N/A 08/29/2019   Procedure: COLONOSCOPY WITH PROPOFOL ;  Surgeon: Jinny Carmine, MD;  Location: [facility];  Service: Endoscopy;  Laterality: N/A;  priority 4   ECTOPIC PREGNANCY SURGERY     Social History   Tobacco Use   Smoking status: Former    Current packs/day: 0.00    Average packs/day: 1 pack/day for 30.0 years (30.0 ttl pk-yrs)    Types: Cigarettes    Start date: 10/14/1975    Quit date: 10/13/2005    Years since quitting: 18.0   Smokeless tobacco: Former    Types: Snuff    Quit date: 10/09/2011  Vaping Use   Vaping status: Never Used  Substance Use Topics   Alcohol use: No    Alcohol/week: 0.0 standard drinks of alcohol   Drug use: Yes    Comment: rare   Social History   Socioeconomic History   Marital status: Married    Spouse name: Not on file   Number of children: 1   Years of education: Not on file   Highest education level: 12th grade  Occupational History   Occupation: retired  Tobacco Use   Smoking status: Former    Current packs/day: 0.00    Average packs/day: 1 pack/day for 30.0 years (30.0 ttl pk-yrs)    Types: Cigarettes    Start date: 10/14/1975    Quit date: 10/13/2005     Years since quitting: 18.0   Smokeless tobacco: Former    Types: Snuff    Quit date: 10/09/2011  Vaping Use   Vaping status: Never Used  Substance and Sexual Activity   Alcohol use: No    Alcohol/week: 0.0 standard drinks of alcohol   Drug use: Yes    Comment: rare   Sexual activity: Yes  Other Topics Concern   Not on file  Social History Narrative   Lives with husband in a one story home.  Has one daughter.     Retired from [employer] work.     Education: high school.   Social Drivers of Health   Financial Resource Strain: Low Risk  (10/04/2023)   Overall Financial Resource Strain (CARDIA)    Difficulty of Paying Living  Expenses: Not hard at all  Food Insecurity: No Food Insecurity (10/04/2023)   Hunger Vital Sign    Worried About Running Out of Food in the Last Year: Never true    Ran Out of Food in the Last Year: Never true  Transportation Needs: No Transportation Needs (10/04/2023)   PRAPARE - Administrator, Civil Service (Medical): No    Lack of Transportation (Non-Medical): No  Physical Activity: Insufficiently Active (10/04/2023)   Exercise Vital Sign    Days of Exercise per Week: 3 days    Minutes of Exercise per Session: 30 min  Stress: No Stress Concern Present (10/04/2023)   [employer] of Occupational Health - Occupational Stress Questionnaire    Feeling of Stress: Not at all  Social Connections: Moderately Integrated (10/04/2023)   Social Connection and Isolation Panel    Frequency of Communication with Friends and Family: More than three times a week    Frequency of Social Gatherings with Friends and Family: Three times a week    Attends Religious Services: More than 4 times per year    Active Member of Clubs or Organizations: No    Attends Banker Meetings: Never    Marital Status: Married  Catering manager Violence: Not At Risk (10/04/2023)   Humiliation, Afraid, Rape, and Kick questionnaire    Fear of Current or Ex-Partner: No     Emotionally Abused: No    Physically Abused: No    Sexually Abused: No   Family Status  Relation Name Status   Mother  Deceased       alzheimers   Father  Deceased       prostate cancer    Brother youngest Deceased       throat cancer   Brother middle Alive   Brother oldest Alive   Oceanographer  (Not Specified)   Daughter  (Not Specified)   Brother  Deceased       cirrhosis of liver   MGM  Deceased       old age    MGF  Deceased   PGM  Deceased       natural complications   PGF  Deceased       old age   No partnership data on file   Family History  Problem Relation Age of Onset   Cancer Mother        colon   Hypertension Mother    Alzheimer's disease Mother    Cancer Father        prostate   Cancer Brother        lung   Stroke Brother    Cancer Brother        prostate and brain   Cancer Brother        throat   Diabetes Paternal Aunt    Breast cancer Paternal Aunt    Healthy Daughter    Cirrhosis Brother    Allergies  Allergen Reactions   Atorvastatin  Itching   Codeine Sulfate Itching   Lyrica  Cr [Pregabalin  Er]     Blurred vision and swelling in legs   Pravastatin  Nausea Only   Protonix [Pantoprazole Sodium] Other (See Comments)    Legs felt heavy   Tylenol With Codeine #3  [Acetaminophen-Codeine] Itching   Penicillin G Rash      Review of Systems  Musculoskeletal:  Positive for joint pain.  All other systems reviewed and are negative.     Objective:     BP  128/72 (Cuff Size: Large)   Pulse 79   Temp 98.2 F (36.8 C) (Oral)   Resp 16   Ht 5' 8 (1.727 m)   Wt 214 lb 4.8 oz (97.2 kg)   LMP  (LMP Unknown)   SpO2 99%   BMI 32.58 kg/m  BP Readings from Last 3 Encounters:  10/29/23 128/72  07/10/23 128/84  05/01/23 136/72   Wt Readings from Last 3 Encounters:  10/29/23 214 lb 4.8 oz (97.2 kg)  07/10/23 208 lb (94.3 kg)  05/01/23 212 lb 3.2 oz (96.3 kg)      Physical Exam Constitutional:      Appearance: Normal appearance.  HENT:      Head: Normocephalic and atraumatic.  Eyes:     Conjunctiva/sclera: Conjunctivae normal.  Cardiovascular:     Rate and Rhythm: Normal rate and regular rhythm.  Pulmonary:     Effort: Pulmonary effort is normal.     Breath sounds: Normal breath sounds.  Musculoskeletal:     Right knee: Normal. No swelling or bony tenderness. No tenderness.     Left knee: Normal. No swelling or bony tenderness. No tenderness.     Right lower leg: Edema present.     Left lower leg: Edema present.     Comments: Mild BLE non-pitting edema present  Skin:    General: Skin is warm and dry.  Neurological:     General: No focal deficit present.     Mental Status: She is alert. Mental status is at baseline.  Psychiatric:        Mood and Affect: Mood normal.        Behavior: Behavior normal.      No results found for any visits on 10/29/23.  Last CBC Lab Results  Component Value Date   WBC 5.7 05/01/2023   HGB 13.2 05/01/2023   HCT 39.9 05/01/2023   MCV 96.8 05/01/2023   MCH 32.0 05/01/2023   RDW 12.7 05/01/2023   PLT 166 05/01/2023   Last metabolic panel Lab Results  Component Value Date   GLUCOSE 131 (H) 05/01/2023   NA 146 05/01/2023   K 4.4 05/01/2023   CL 105 05/01/2023   CO2 33 (H) 05/01/2023   BUN 14 05/01/2023   CREATININE 0.95 05/01/2023   EGFR 61 05/01/2023   CALCIUM  10.2 05/01/2023   PROT 6.9 05/01/2023   ALBUMIN 4.1 05/28/2017   LABGLOB 2.6 05/07/2015   AGRATIO 1.5 05/07/2015   BILITOT 0.5 05/01/2023   ALKPHOS 80 05/28/2017   AST 12 05/01/2023   ALT 11 05/01/2023   ANIONGAP 6 05/28/2017   Last lipids Lab Results  Component Value Date   CHOL 116 05/01/2023   HDL 63 05/01/2023   LDLCALC 33 05/01/2023   TRIG 122 05/01/2023   CHOLHDL 1.8 05/01/2023   Last hemoglobin A1c Lab Results  Component Value Date   HGBA1C 5.7 (H) 05/01/2023   Last thyroid  functions Lab Results  Component Value Date   TSH 2.59 03/29/2022   Last vitamin D  Lab Results  Component  Value Date   VD25OH 48 03/29/2022   Last vitamin B12 and Folate Lab Results  Component Value Date   VITAMINB12 406 03/29/2022   FOLATE 17.9 03/29/2022      The ASCVD Risk score (Arnett DK, et al., 2019) failed to calculate for the following reasons:   The valid total cholesterol range is 130 to 320 mg/dL    Assessment & Plan:   Assessment & Plan Peripheral Edema Swelling  likely due to prolonged sitting and hot weather. Avoid diuretics to prevent kidney strain. - Advise leg elevation each evening. - Consider compression stockings. - Provide information on managing leg swelling.  Neuropathy Pain managed with Duloxetine  60 mg and CBD gummies. Duloxetine  effective for neuropathic pain, CBD may help arthritis pain. Both can be used together. - Refill duloxetine  prescription. - Advise concurrent use of CBD gummies with duloxetine .  Prediabetes A1c previously at 5.7, fluctuating within prediabetic range. - Perform finger stick to check A1c - within normal range at 5.6% today. - Call with results if she does not want to wait.  Hyperlipidemia On Zetia  and Crestor  for cholesterol management. - Refill Zetia  and Crestor  prescriptions.  General Health Maintenance Up to date on pneumonia and RSV vaccines. Managing allergy  medication based on cost. - Ensure up to date on vaccines. - Discuss allergy  medication options.  - DULoxetine  (CYMBALTA ) 60 MG capsule; Take 1 capsule (60 mg total) by mouth daily.  Dispense: 90 capsule; Refill: 1 - ezetimibe  (ZETIA ) 10 MG tablet; Take 1 tablet (10 mg total) by mouth daily.  Dispense: 90 tablet; Refill: 1 - rosuvastatin  (CRESTOR ) 5 MG tablet; TAKE 1 TABLET BY MOUTH EVERY OTHER DAY AT BEDTIME FOR  ATHEROSCLEROTIC  DISEASE  Dispense: 90 tablet; Refill: 1 - POCT HgB A1C   Return in about 6 months (around 04/30/2024).    Sharyle Fischer, DO

## 2023-10-29 NOTE — Patient Instructions (Signed)
Edema  Edema is an abnormal buildup of fluids in the body tissues and under the skin. Swelling of the legs, feet, and ankles is a common symptom that becomes more likely as you get older. Swelling is also common in looser tissues, such as around the eyes. Pressing on the area may make a temporary dent in your skin (pitting edema). This fluid may also accumulate in your lungs (pulmonary edema). There are many possible causes of edema. Eating too much salt (sodium) and being on your feet or sitting for a long time can cause edema in your legs, feet, and ankles. Common causes of edema include: Certain medical conditions, such as heart failure, liver or kidney disease, and cancer. Weak leg blood vessels. An injury. Pregnancy. Medicines. Being obese. Low protein levels in the blood. Hot weather may make edema worse. Edema is usually painless. Your skin may look swollen or shiny. Follow these instructions at home: Medicines Take over-the-counter and prescription medicines only as told by your health care provider. Your health care provider may prescribe a medicine to help your body get rid of extra water (diuretic). Take this medicine if you are told to take it. Eating and drinking Eat a low-salt (low-sodium) diet to reduce fluid as told by your health care provider. Sometimes, eating less salt may reduce swelling. Depending on the cause of your swelling, you may need to limit how much fluid you drink (fluid restriction). General instructions Raise (elevate) the injured area above the level of your heart while you are sitting or lying down. Do not sit still or stand for long periods of time. Do not wear tight clothing. Do not wear garters on your upper legs. Exercise your legs to get your circulation going. This helps to move the fluid back into your blood vessels, and it may help the swelling go down. Wear compression stockings as told by your health care provider. These stockings help to prevent  blood clots and reduce swelling in your legs. It is important that these are the correct size. These stockings should be prescribed by your health care provider to prevent possible injuries. If elastic bandages or wraps are recommended, use them as told by your health care provider. Contact a health care provider if: Your edema does not get better with treatment. You have heart, liver, or kidney disease and have symptoms of edema. You have sudden and unexplained weight gain. Get help right away if: You develop shortness of breath or chest pain. You cannot breathe when you lie down. You develop pain, redness, or warmth in the swollen areas. You have heart, liver, or kidney disease and suddenly get edema. You have a fever and your symptoms suddenly get worse. These symptoms may be an emergency. Get help right away. Call 911. Do not wait to see if the symptoms will go away. Do not drive yourself to the hospital. Summary Edema is an abnormal buildup of fluids in the body tissues and under the skin. Eating too much salt (sodium)and being on your feet or sitting for a long time can cause edema in your legs, feet, and ankles. Raise (elevate) the injured area above the level of your heart while you are sitting or lying down. Follow your health care provider's instructions about diet and how much fluid you can drink. This information is not intended to replace advice given to you by your health care provider. Make sure you discuss any questions you have with your health care provider. Document Revised: 11/29/2020 Document  Reviewed: 11/29/2020 Elsevier Patient Education  2024 [employer].

## 2023-12-31 ENCOUNTER — Other Ambulatory Visit: Payer: Self-pay | Admitting: Internal Medicine

## 2023-12-31 DIAGNOSIS — Z1231 Encounter for screening mammogram for malignant neoplasm of breast: Secondary | ICD-10-CM

## 2024-02-13 ENCOUNTER — Ambulatory Visit
Admission: RE | Admit: 2024-02-13 | Discharge: 2024-02-13 | Disposition: A | Discharge status: Home / Self Care | Source: Ambulatory Visit | Attending: Internal Medicine | Admitting: Internal Medicine

## 2024-02-13 DIAGNOSIS — Z1231 Encounter for screening mammogram for malignant neoplasm of breast: Secondary | ICD-10-CM | POA: Insufficient documentation

## 2024-02-15 ENCOUNTER — Ambulatory Visit: Payer: Self-pay | Admitting: Internal Medicine

## 2024-04-25 ENCOUNTER — Other Ambulatory Visit: Payer: Self-pay | Admitting: Emergency Medicine

## 2024-04-25 DIAGNOSIS — G5793 Unspecified mononeuropathy of bilateral lower limbs: Secondary | ICD-10-CM

## 2024-04-25 DIAGNOSIS — E785 Hyperlipidemia, unspecified: Secondary | ICD-10-CM

## 2024-04-25 DIAGNOSIS — I2510 Atherosclerotic heart disease of native coronary artery without angina pectoris: Secondary | ICD-10-CM

## 2024-05-06 ENCOUNTER — Other Ambulatory Visit: Payer: Self-pay | Admitting: Internal Medicine

## 2024-05-06 ENCOUNTER — Ambulatory Visit: Admitting: Internal Medicine

## 2024-05-06 DIAGNOSIS — E785 Hyperlipidemia, unspecified: Secondary | ICD-10-CM

## 2024-05-06 DIAGNOSIS — G5793 Unspecified mononeuropathy of bilateral lower limbs: Secondary | ICD-10-CM

## 2024-05-06 DIAGNOSIS — I2510 Atherosclerotic heart disease of native coronary artery without angina pectoris: Secondary | ICD-10-CM

## 2024-05-06 MED ORDER — EZETIMIBE 10 MG PO TABS: 10.0000 mg | ORAL_TABLET | Freq: Every day | ORAL | 1 refills | Status: AC

## 2024-05-06 MED ORDER — DULOXETINE HCL 60 MG PO CPEP: 60.0000 mg | ORAL_CAPSULE | Freq: Every day | ORAL | 1 refills | Status: AC

## 2024-05-06 MED ORDER — ROSUVASTATIN CALCIUM 5 MG PO TABS: ORAL_TABLET | ORAL | 1 refills | Status: AC

## 2024-05-06 NOTE — Telephone Encounter (Signed)
 Copied from CRM #[id number]. Topic: Clinical - Medication Refill >> May 06, 2024  8:45 AM Antony RAMAN wrote: Medication: DULoxetine  (CYMBALTA ) 60 MG capsule ezetimibe  (ZETIA ) 10 MG tablet rosuvastatin  (CRESTOR ) 5 MG tablet  Has the patient contacted their pharmacy? Yes (Agent: If no, request that the patient contact the pharmacy for the refill. If patient does not wish to contact the pharmacy document the reason why and proceed with request.) (Agent: If yes, when and what did the pharmacy advise?)  This is the patient's preferred pharmacy:    [facility] - [place], [place] - [address] [place] [address] Phone: [phone] Fax: [phone]  Is this the correct pharmacy for this prescription? Yes If no, delete pharmacy and type the correct one.   Has the prescription been filled recently? unknown  Is the patient out of the medication? unknown  Has the patient been seen for an appointment in the last year OR does the patient have an upcoming appointment? Yes  Can we respond through MyChart? Yes  Agent: Please be advised that Rx refills may take up to 3 business days. We ask that you follow-up with your pharmacy.

## 2024-05-07 NOTE — Telephone Encounter (Signed)
 Duplicate request, refilled 05/06/24.  Requested Prescriptions  Pending Prescriptions Disp Refills   ezetimibe  (ZETIA ) 10 MG tablet 90 tablet 1    Sig: Take 1 tablet (10 mg total) by mouth daily.     Cardiovascular:  Antilipid - Sterol Transport Inhibitors Failed - 05/07/2024  8:28 AM      Failed - AST in normal range and within 360 days    AST  Date Value Ref Range Status  05/01/2023 12 10 - 35 U/L Final         Failed - ALT in normal range and within 360 days    ALT  Date Value Ref Range Status  05/01/2023 11 6 - 29 U/L Final         Failed - Lipid Panel in normal range within the last 12 months    Cholesterol, Total  Date Value Ref Range Status  05/07/2015 177 100 - 199 mg/dL Final   Cholesterol  Date Value Ref Range Status  05/01/2023 116 <200 mg/dL Final   LDL Cholesterol (Calc)  Date Value Ref Range Status  05/01/2023 33 mg/dL (calc) Final    Comment:    Reference range: <100 . Desirable range <100 mg/dL for primary prevention;   <70 mg/dL for patients with CHD or diabetic patients  with > or = 2 CHD risk factors. SABRA LDL-C is now calculated using the Martin-Hopkins  calculation, which is a validated novel method providing  better accuracy than the Friedewald equation in the  estimation of LDL-C.  Gladis APPLETHWAITE et al. SANDREA. 7986;689(80): 2061-2068  (http://education.[facility].com/faq/FAQ164)    HDL  Date Value Ref Range Status  05/01/2023 63 > OR = 50 mg/dL Final  98/72/7982 47 >60 mg/dL Final   Triglycerides  Date Value Ref Range Status  05/01/2023 122 <150 mg/dL Final         Passed - Patient is not pregnant      Passed - Valid encounter within last 12 months    Recent Outpatient Visits           6 months ago Neuropathy of both feet   [facility] Bernardo Fend, DO   10 months ago Constipation, unspecified constipation type   [facility] Bernardo Fend, DO                DULoxetine  (CYMBALTA ) 60 MG capsule 90 capsule 1    Sig: Take 1 capsule (60 mg total) by mouth daily.     Psychiatry: Antidepressants - SNRI - duloxetine  Failed - 05/07/2024  8:28 AM      Failed - Cr in normal range and within 360 days    Creat  Date Value Ref Range Status  05/01/2023 0.95 0.60 - 1.00 mg/dL Final   Creatinine, Urine  Date Value Ref Range Status  06/04/2017 121 20 - 275 mg/dL Final         Failed - eGFR is 30 or above and within 360 days    GFR, Est African American  Date Value Ref Range Status  05/17/2020 69 > OR = 60 mL/min/1.33m2 Final   GFR, Est Non African American  Date Value Ref Range Status  05/17/2020 59 (L) > OR = 60 mL/min/1.73m2 Final   eGFR  Date Value Ref Range Status  05/01/2023 61 > OR = 60 mL/min/1.48m2 Final         Failed - Valid encounter within last 6 months    Recent Outpatient Visits  6 months ago Neuropathy of both feet   [facility] Bernardo Fend, DO   10 months ago Constipation, unspecified constipation type   [facility] Bernardo Fend, [place]              Passed - Completed PHQ-2 or PHQ-9 in the last 360 days      Passed - Last BP in normal range    BP Readings from Last 1 Encounters:  10/29/23 128/72          rosuvastatin  (CRESTOR ) 5 MG tablet 90 tablet 1    Sig: TAKE 1 TABLET BY MOUTH EVERY OTHER DAY AT BEDTIME FOR  ATHEROSCLEROTIC  DISEASE     Cardiovascular:  Antilipid - Statins 2 Failed - 05/07/2024  8:28 AM      Failed - Cr in normal range and within 360 days    Creat  Date Value Ref Range Status  05/01/2023 0.95 0.60 - 1.00 mg/dL Final   Creatinine, Urine  Date Value Ref Range Status  06/04/2017 121 20 - 275 mg/dL Final         Failed - Lipid Panel in normal range within the last 12 months    Cholesterol, Total  Date Value Ref Range Status  05/07/2015 177 100 - 199 mg/dL Final   Cholesterol  Date Value Ref Range Status   05/01/2023 116 <200 mg/dL Final   LDL Cholesterol (Calc)  Date Value Ref Range Status  05/01/2023 33 mg/dL (calc) Final    Comment:    Reference range: <100 . Desirable range <100 mg/dL for primary prevention;   <70 mg/dL for patients with CHD or diabetic patients  with > or = 2 CHD risk factors. SABRA LDL-C is now calculated using the Martin-Hopkins  calculation, which is a validated novel method providing  better accuracy than the Friedewald equation in the  estimation of LDL-C.  Gladis APPLETHWAITE et al. SANDREA. 7986;689(80): 2061-2068  (http://education.[facility].com/faq/FAQ164)    HDL  Date Value Ref Range Status  05/01/2023 63 > OR = 50 mg/dL Final  98/72/7982 47 >60 mg/dL Final   Triglycerides  Date Value Ref Range Status  05/01/2023 122 <150 mg/dL Final         Passed - Patient is not pregnant      Passed - Valid encounter within last 12 months    Recent Outpatient Visits           6 months ago Neuropathy of both feet   [facility] Bernardo Fend, DO   10 months ago Constipation, unspecified constipation type   [facility] Bernardo Fend, [place]

## 2024-05-08 NOTE — Telephone Encounter (Signed)
 Copied from CRM #[id number]. Topic: Appointments - Scheduling Inquiry for Clinic >> May 05, 2024  3:14 PM Joesph B wrote: Reason for CRM: patient would like to come in sooner than February 24th. Please advise if Dr.Andrews has availability.  Appt cancelled due to weather

## 2024-05-08 NOTE — Telephone Encounter (Signed)
 Lvm to try and get pt seen sooner

## 2024-05-14 ENCOUNTER — Ambulatory Visit: Admitting: Internal Medicine

## 2024-06-03 ENCOUNTER — Ambulatory Visit: Admitting: Internal Medicine

## 2024-10-09 ENCOUNTER — Ambulatory Visit
